# Patient Record
Sex: Female | Born: 1965 | Race: White | Hispanic: No | Marital: Married | Smoking: Never smoker
Health system: Southern US, Community
[De-identification: ages and names within clinical notes are randomized; demographics above are authoritative.]

## PROBLEM LIST (undated history)

## (undated) DIAGNOSIS — C8590 Non-Hodgkin lymphoma, unspecified, unspecified site: Secondary | ICD-10-CM

## (undated) DIAGNOSIS — F32A Depression, unspecified: Secondary | ICD-10-CM

## (undated) DIAGNOSIS — C801 Malignant (primary) neoplasm, unspecified: Secondary | ICD-10-CM

## (undated) HISTORY — DX: Non-Hodgkin lymphoma, unspecified, unspecified site: C85.90

## (undated) HISTORY — DX: Depression, unspecified: F32.A

## (undated) HISTORY — PX: CHOLECYSTECTOMY: SHX55

## (undated) HISTORY — PX: ABDOMINAL HYSTERECTOMY: SHX81

---

## 2007-03-23 ENCOUNTER — Emergency Department (HOSPITAL_COMMUNITY): Admission: EM | Admit: 2007-03-23 | Discharge: 2007-03-24 | Payer: Self-pay | Admitting: Emergency Medicine

## 2007-07-18 ENCOUNTER — Emergency Department (HOSPITAL_COMMUNITY): Admission: EM | Admit: 2007-07-18 | Discharge: 2007-07-18 | Payer: Self-pay | Admitting: Pediatrics

## 2007-08-13 ENCOUNTER — Emergency Department (HOSPITAL_COMMUNITY): Admission: EM | Admit: 2007-08-13 | Discharge: 2007-08-13 | Payer: Self-pay | Admitting: Emergency Medicine

## 2008-11-29 ENCOUNTER — Emergency Department: Payer: Self-pay | Admitting: Emergency Medicine

## 2011-06-23 LAB — INFLUENZA A+B VIRUS AG-DIRECT(RAPID)
Inflenza A Ag: NEGATIVE
Influenza B Ag: NEGATIVE

## 2011-06-30 LAB — URINE MICROSCOPIC-ADD ON

## 2011-06-30 LAB — URINALYSIS, ROUTINE W REFLEX MICROSCOPIC
Bilirubin Urine: NEGATIVE
Nitrite: POSITIVE — AB
Specific Gravity, Urine: 1.011
Urobilinogen, UA: 0.2
pH: 6

## 2017-11-01 DIAGNOSIS — Z8541 Personal history of malignant neoplasm of cervix uteri: Secondary | ICD-10-CM | POA: Insufficient documentation

## 2017-11-04 DIAGNOSIS — F331 Major depressive disorder, recurrent, moderate: Secondary | ICD-10-CM | POA: Insufficient documentation

## 2018-01-24 DIAGNOSIS — M2011 Hallux valgus (acquired), right foot: Secondary | ICD-10-CM | POA: Insufficient documentation

## 2018-01-24 DIAGNOSIS — M7741 Metatarsalgia, right foot: Secondary | ICD-10-CM | POA: Insufficient documentation

## 2018-01-24 DIAGNOSIS — M2021 Hallux rigidus, right foot: Secondary | ICD-10-CM | POA: Insufficient documentation

## 2018-03-21 DIAGNOSIS — M25562 Pain in left knee: Secondary | ICD-10-CM | POA: Insufficient documentation

## 2018-03-21 DIAGNOSIS — G8929 Other chronic pain: Secondary | ICD-10-CM | POA: Insufficient documentation

## 2018-03-21 DIAGNOSIS — M8949 Other hypertrophic osteoarthropathy, multiple sites: Secondary | ICD-10-CM | POA: Insufficient documentation

## 2018-03-21 DIAGNOSIS — M150 Primary generalized (osteo)arthritis: Secondary | ICD-10-CM | POA: Insufficient documentation

## 2018-03-21 DIAGNOSIS — M159 Polyosteoarthritis, unspecified: Secondary | ICD-10-CM | POA: Insufficient documentation

## 2018-06-06 DIAGNOSIS — M79671 Pain in right foot: Secondary | ICD-10-CM | POA: Insufficient documentation

## 2018-06-06 DIAGNOSIS — M9689 Other intraoperative and postprocedural complications and disorders of the musculoskeletal system: Secondary | ICD-10-CM | POA: Insufficient documentation

## 2018-06-06 DIAGNOSIS — M2041 Other hammer toe(s) (acquired), right foot: Secondary | ICD-10-CM | POA: Insufficient documentation

## 2019-01-04 DIAGNOSIS — T84498A Other mechanical complication of other internal orthopedic devices, implants and grafts, initial encounter: Secondary | ICD-10-CM | POA: Insufficient documentation

## 2019-02-06 ENCOUNTER — Other Ambulatory Visit: Payer: Self-pay

## 2019-02-06 ENCOUNTER — Emergency Department (HOSPITAL_COMMUNITY)
Admission: EM | Admit: 2019-02-06 | Discharge: 2019-02-06 | Disposition: A | Discharge status: Home / Self Care | Payer: BLUE CROSS/BLUE SHIELD | Attending: Emergency Medicine | Admitting: Emergency Medicine

## 2019-02-06 ENCOUNTER — Encounter (HOSPITAL_COMMUNITY): Payer: Self-pay | Admitting: Emergency Medicine

## 2019-02-06 DIAGNOSIS — K0889 Other specified disorders of teeth and supporting structures: Secondary | ICD-10-CM

## 2019-02-06 DIAGNOSIS — K029 Dental caries, unspecified: Secondary | ICD-10-CM | POA: Diagnosis not present

## 2019-02-06 MED ORDER — PENICILLIN V POTASSIUM 500 MG PO TABS: 500.0000 mg | ORAL_TABLET | Freq: Four times a day (QID) | ORAL | 0 refills | Status: AC

## 2019-02-06 MED ORDER — PENICILLIN V POTASSIUM 250 MG PO TABS
500.0000 mg | ORAL_TABLET | Freq: Once | ORAL | Status: AC
  Administered 2019-02-06: 500 mg via ORAL
  Filled 2019-02-06: qty 2

## 2019-02-06 MED ORDER — ACETAMINOPHEN 500 MG PO TABS: 500.0000 mg | ORAL_TABLET | Freq: Four times a day (QID) | ORAL | 0 refills | Status: DC | PRN

## 2019-02-06 MED ORDER — IBUPROFEN 600 MG PO TABS: 600.0000 mg | ORAL_TABLET | Freq: Four times a day (QID) | ORAL | 0 refills | Status: DC | PRN

## 2019-02-06 MED ORDER — ACETAMINOPHEN 500 MG PO TABS
1000.0000 mg | ORAL_TABLET | Freq: Once | ORAL | Status: AC
  Administered 2019-02-06: 18:00:00 1000 mg via ORAL
  Filled 2019-02-06: qty 2

## 2019-02-06 NOTE — ED Notes (Signed)
Patient verbalizes understanding of discharge instructions. Opportunity for questioning and answers were provided. Armband removed by staff, pt discharged from ED. Pt ambulatory to lobby. Prescriptions/pharmacy and follow up care reviewed. Pt ambulatory to lobby.

## 2019-02-06 NOTE — ED Provider Notes (Signed)
[facility]PART[facility] Provider Note   CSN: [id number] Arrival date & time: 02/06/19  1659    History   Chief Complaint Chief Complaint  Patient presents with  . Dental Pain    HPI Jennifer Cole is a 53 y.o. female who is previously healthy who presents with a 1 day history of right lower dental pain.  Patient has had problems with a tooth before, however began hurting again yesterday.  She has been taking 400 mg ibuprofen and Orajel without relief.  She denies any fever.  She has had some pain radiating to her ear.  She denies any neck pain.  She does not have a Pharmacist, community or insurance.     HPI  History reviewed. No pertinent past medical history.  There are no active problems to display for this patient.   History reviewed. No pertinent surgical history.   OB History   No obstetric history on file.      Home Medications    Prior to Admission medications   Medication Sig Start Date End Date Taking? Authorizing Provider  acetaminophen (TYLENOL) 500 MG tablet Take 1 tablet (500 mg total) by mouth every 6 (six) hours as needed. 02/06/19   Renessa Wellnitz, Bea Graff, PA-C  ibuprofen (ADVIL) 600 MG tablet Take 1 tablet (600 mg total) by mouth every 6 (six) hours as needed. 02/06/19   Frederica Kuster, PA-C  penicillin v potassium (VEETID) 500 MG tablet Take 1 tablet (500 mg total) by mouth 4 (four) times daily for 7 days. 02/06/19 02/13/19  Frederica Kuster, PA-C    Family History No family history on file.  Social History Social History   Tobacco Use  . Smoking status: Not on file  Substance Use Topics  . Alcohol use: Not on file  . Drug use: Not on file     Allergies   Patient has no known allergies.   Review of Systems Review of Systems  Constitutional: Negative for fever.  HENT: Positive for dental problem.   Musculoskeletal: Negative for neck pain.     Physical Exam Updated Vital Signs BP 108/74   Pulse 66   Temp 98.5 F (36.9  C) (Oral)   Resp 16   SpO2 96%   Physical Exam Vitals signs and nursing note reviewed.  Constitutional:      General: She is not in acute distress.    Appearance: She is well-developed. She is not diaphoretic.  HENT:     Head: Normocephalic and atraumatic.     Mouth/Throat:     Dentition: Abnormal dentition. Dental caries present.     Pharynx: No oropharyngeal exudate.      Comments: No trismus, no submandibular tenderness Eyes:     General: No scleral icterus.       Right eye: No discharge.        Left eye: No discharge.     Conjunctiva/sclera: Conjunctivae normal.     Pupils: Pupils are equal, round, and reactive to light.  Neck:     Musculoskeletal: Normal range of motion and neck supple.     Thyroid: No thyromegaly.  Cardiovascular:     Rate and Rhythm: Normal rate and regular rhythm.     Heart sounds: Normal heart sounds. No murmur. No friction rub. No gallop.   Pulmonary:     Effort: Pulmonary effort is normal. No respiratory distress.     Breath sounds: Normal breath sounds. No stridor. No wheezing or rales.  Lymphadenopathy:  Cervical: No cervical adenopathy.  Skin:    General: Skin is warm and dry.     Coloration: Skin is not pale.     Findings: No rash.  Neurological:     Mental Status: She is alert.     Coordination: Coordination normal.      ED Treatments / Results  Labs (all labs ordered are listed, but only abnormal results are displayed) Labs Reviewed - No data to display  EKG None  Radiology No results found.  Procedures Procedures (including critical care time)  Medications Ordered in ED Medications  penicillin v potassium (VEETID) tablet 500 mg (500 mg Oral Given 02/06/19 1746)  acetaminophen (TYLENOL) tablet 1,000 mg (1,000 mg Oral Given 02/06/19 1747)     Initial Impression / Assessment and Plan / ED Course  I have reviewed the triage vital signs and the nursing notes.  Pertinent labs & imaging results that were available  during my care of the patient were reviewed by me and considered in my medical decision making (see chart for details).        Patient with dentalgia.  No abscess requiring immediate incision and drainage.  Exam not concerning for Ludwig's angina or pharyngeal abscess.  Will treat with penicillin, ibuprofen, Tylenol. Pt instructed to follow-up with dentist.  Given several resources discussed return precautions.  Patient understands and agrees with plan.  Patient vitals stable throughout ED course and discharged in satisfactory condition.  Final Clinical Impressions(s) / ED Diagnoses   Final diagnoses:  Pain, dental    ED Discharge Orders         Ordered    penicillin v potassium (VEETID) 500 MG tablet  4 times daily     02/06/19 1809    ibuprofen (ADVIL) 600 MG tablet  Every 6 hours PRN     02/06/19 1809    acetaminophen (TYLENOL) 500 MG tablet  Every 6 hours PRN     02/06/19 1809           Frederica Kuster, PA-C 02/06/19 1847    Hayden Rasmussen, MD 02/07/19 1153

## 2019-02-06 NOTE — ED Triage Notes (Signed)
Pt having dental pain since last night on lower right tooth. Taking OTC with no pain relief. Pt needs dentist referrals.

## 2019-02-06 NOTE — Discharge Instructions (Addendum)
Take penicillin as prescribed until completed.  Take ibuprofen 600 mg every 6 hours.  Alternate with Tylenol 500 mg every 6 hours.  Please follow-up with Dr. Geralynn Ochs by calling first thing in the morning.  If you are unable to schedule appointments with him, please begin calling some of the other resources below.  Please return to the emergency department develop any new or worsening symptoms including lockjaw, significant swelling to your neck, or persistent fever of 100.4.

## 2019-05-16 ENCOUNTER — Other Ambulatory Visit: Payer: Self-pay

## 2019-05-16 DIAGNOSIS — Z20822 Contact with and (suspected) exposure to covid-19: Secondary | ICD-10-CM

## 2019-05-17 LAB — NOVEL CORONAVIRUS, NAA: SARS-CoV-2, NAA: NOT DETECTED

## 2019-06-29 DIAGNOSIS — L84 Corns and callosities: Secondary | ICD-10-CM | POA: Insufficient documentation

## 2019-06-29 DIAGNOSIS — M7742 Metatarsalgia, left foot: Secondary | ICD-10-CM | POA: Insufficient documentation

## 2019-12-15 ENCOUNTER — Ambulatory Visit: Payer: BLUE CROSS/BLUE SHIELD | Attending: Internal Medicine

## 2019-12-21 ENCOUNTER — Ambulatory Visit: Payer: BLUE CROSS/BLUE SHIELD | Attending: Internal Medicine

## 2019-12-21 DIAGNOSIS — Z23 Encounter for immunization: Secondary | ICD-10-CM

## 2019-12-21 NOTE — Progress Notes (Signed)
   Covid-19 Vaccination Clinic  Name:  Jennifer Cole    MRN: [id number] DOB: June 22, 1966  12/21/2019  [name] was observed post Covid-19 immunization for 15 minutes without incident. She was provided with Vaccine Information Sheet and instruction to access the V-Safe system.   [name] was instructed to call 911 with any severe reactions post vaccine: Marland Kitchen Difficulty breathing  . Swelling of face and throat  . A fast heartbeat  . A bad rash all over body  . Dizziness and weakness   Immunizations Administered    Name Date Dose VIS Date Route   Pfizer COVID-19 Vaccine 12/21/2019  4:29 PM 0.3 mL 08/25/2019 Intramuscular   Manufacturer: [place]   Lot: [id number]   [place]: [id number]

## 2020-01-15 ENCOUNTER — Ambulatory Visit: Payer: BLUE CROSS/BLUE SHIELD | Attending: Internal Medicine

## 2020-01-15 DIAGNOSIS — Z23 Encounter for immunization: Secondary | ICD-10-CM

## 2020-01-15 NOTE — Progress Notes (Signed)
   Covid-19 Vaccination Clinic  Name:  Jennifer Cole    MRN: [id number] DOB: 1966/03/18  01/15/2020  Jennifer Cole was observed post Covid-19 immunization for 15 minutes without incident. She was provided with Vaccine Information Sheet and instruction to access the V-Safe system.   Jennifer Cole was instructed to call 911 with any severe reactions post vaccine: Marland Kitchen Difficulty breathing  . Swelling of face and throat  . A fast heartbeat  . A bad rash all over body  . Dizziness and weakness   Immunizations Administered    Name Date Dose VIS Date Route   Pfizer COVID-19 Vaccine 01/15/2020  4:38 PM 0.3 mL 11/08/2018 Intramuscular   Manufacturer: [place]   Lot: [id number]   [place]: [id number]

## 2020-10-23 ENCOUNTER — Emergency Department: Payer: 59

## 2020-10-23 ENCOUNTER — Other Ambulatory Visit: Payer: Self-pay

## 2020-10-23 ENCOUNTER — Emergency Department
Admission: EM | Admit: 2020-10-23 | Discharge: 2020-10-23 | Disposition: A | Discharge status: Home / Self Care | Payer: 59 | Attending: Emergency Medicine | Admitting: Emergency Medicine

## 2020-10-23 DIAGNOSIS — Z791 Long term (current) use of non-steroidal anti-inflammatories (NSAID): Secondary | ICD-10-CM | POA: Insufficient documentation

## 2020-10-23 DIAGNOSIS — Z20822 Contact with and (suspected) exposure to covid-19: Secondary | ICD-10-CM | POA: Insufficient documentation

## 2020-10-23 DIAGNOSIS — R0602 Shortness of breath: Secondary | ICD-10-CM | POA: Diagnosis present

## 2020-10-23 DIAGNOSIS — J9811 Atelectasis: Secondary | ICD-10-CM | POA: Insufficient documentation

## 2020-10-23 DIAGNOSIS — J948 Other specified pleural conditions: Secondary | ICD-10-CM | POA: Diagnosis not present

## 2020-10-23 DIAGNOSIS — R918 Other nonspecific abnormal finding of lung field: Secondary | ICD-10-CM | POA: Insufficient documentation

## 2020-10-23 DIAGNOSIS — Z8541 Personal history of malignant neoplasm of cervix uteri: Secondary | ICD-10-CM | POA: Insufficient documentation

## 2020-10-23 DIAGNOSIS — D1430 Benign neoplasm of unspecified bronchus and lung: Secondary | ICD-10-CM | POA: Insufficient documentation

## 2020-10-23 DIAGNOSIS — Z79899 Other long term (current) drug therapy: Secondary | ICD-10-CM | POA: Diagnosis not present

## 2020-10-23 DIAGNOSIS — J90 Pleural effusion, not elsewhere classified: Secondary | ICD-10-CM | POA: Insufficient documentation

## 2020-10-23 DIAGNOSIS — Z9889 Other specified postprocedural states: Secondary | ICD-10-CM

## 2020-10-23 HISTORY — DX: Malignant (primary) neoplasm, unspecified: C80.1

## 2020-10-23 LAB — CBC WITH DIFFERENTIAL/PLATELET
Abs Immature Granulocytes: 0.02 10*3/uL (ref 0.00–0.07)
Basophils Absolute: 0.1 10*3/uL (ref 0.0–0.1)
Basophils Relative: 1 %
Eosinophils Absolute: 0.4 10*3/uL (ref 0.0–0.5)
Eosinophils Relative: 6 %
HCT: 38.4 % (ref 36.0–46.0)
Hemoglobin: 12.3 g/dL (ref 12.0–15.0)
Immature Granulocytes: 0 %
Lymphocytes Relative: 8 %
Lymphs Abs: 0.5 10*3/uL — ABNORMAL LOW (ref 0.7–4.0)
MCH: 29.3 pg (ref 26.0–34.0)
MCHC: 32 g/dL (ref 30.0–36.0)
MCV: 91.4 fL (ref 80.0–100.0)
Monocytes Absolute: 0.6 10*3/uL (ref 0.1–1.0)
Monocytes Relative: 9 %
Neutro Abs: 4.5 10*3/uL (ref 1.7–7.7)
Neutrophils Relative %: 76 %
Platelets: 322 10*3/uL (ref 150–400)
RBC: 4.2 MIL/uL (ref 3.87–5.11)
RDW: 13.9 % (ref 11.5–15.5)
WBC: 6 10*3/uL (ref 4.0–10.5)
nRBC: 0 % (ref 0.0–0.2)

## 2020-10-23 LAB — POC SARS CORONAVIRUS 2 AG -  ED: SARS Coronavirus 2 Ag: NEGATIVE

## 2020-10-23 LAB — PROCALCITONIN: Procalcitonin: <0.1 ng/mL

## 2020-10-23 LAB — BASIC METABOLIC PANEL
Anion gap: 13 (ref 5–15)
BUN: 17 mg/dL (ref 6–20)
CO2: 26 mmol/L (ref 22–32)
Calcium: 9.5 mg/dL (ref 8.9–10.3)
Chloride: 102 mmol/L (ref 98–111)
Creatinine, Ser: 0.65 mg/dL (ref 0.44–1.00)
GFR, Estimated: >60 mL/min (ref >60)
Glucose, Bld: 130 mg/dL — ABNORMAL HIGH (ref 70–99)
Potassium: 3.9 mmol/L (ref 3.5–5.1)
Sodium: 141 mmol/L (ref 135–145)

## 2020-10-23 LAB — TROPONIN I (HIGH SENSITIVITY)
Troponin I (High Sensitivity): 14 ng/L (ref <18)
Troponin I (High Sensitivity): 12 ng/L (ref <18)

## 2020-10-23 LAB — SARS CORONAVIRUS 2 (TAT 6-24 HRS): SARS Coronavirus 2: NEGATIVE

## 2020-10-23 LAB — BRAIN NATRIURETIC PEPTIDE: B Natriuretic Peptide: 17.3 pg/mL (ref 0.0–100.0)

## 2020-10-23 MED ORDER — IOHEXOL 350 MG/ML SOLN
75.0000 mL | Freq: Once | INTRAVENOUS | Status: AC | PRN
  Administered 2020-10-23: 75 mL via INTRAVENOUS

## 2020-10-23 NOTE — ED Provider Notes (Signed)
[facility] Emergency Department Provider Note   ____________________________________________   Event Date/Time   First MD Initiated Contact with Patient 10/23/20 [phone]     (approximate)  I have reviewed the triage vital signs and the nursing notes.   HISTORY  Chief Complaint Shortness of Breath    HPI Modell Corona is a 55 y.o. female with no significant past medical history who presents to the ED complaining of shortness of breath.  Patient reports that she started feeling short of breath 3 days ago with a nonproductive cough.  Symptoms have gradually worsened since then and seem to be exacerbated when she is up walking around.  She felt increasingly short of breath at work today and decided to be evaluated in the ED.  She denies any fevers, nausea, vomiting, diarrhea, or abdominal pain.  She does describe a feeling of tightness in her chest, but denies any overt chest pain.  She has not noticed any pain or swelling in her legs and denies any recent surgeries or long trips.  She was exposed to her and recently, who has tested positive for COVID-19.  She is fully vaccinated against COVID-19.        Past Medical History:  Diagnosis Date  . Cancer ([place])    cervical    There are no problems to display for this patient.   No past surgical history on file.  Prior to Admission medications   Medication Sig Start Date End Date Taking? Authorizing Provider  acetaminophen (TYLENOL) 500 MG tablet Take 1 tablet (500 mg total) by mouth every 6 (six) hours as needed. 02/06/19   Law, Bea Graff, PA-C  ibuprofen (ADVIL) 600 MG tablet Take 1 tablet (600 mg total) by mouth every 6 (six) hours as needed. 02/06/19   Frederica Kuster, PA-C    Allergies Patient has no known allergies.  No family history on file.  Social History    Review of Systems  Constitutional: No fever/chills Eyes: No visual changes. ENT: No sore throat. Cardiovascular: Positive for  chest tightness. Respiratory: Positive for cough and shortness of breath. Gastrointestinal: No abdominal pain.  No nausea, no vomiting.  No diarrhea.  No constipation. Genitourinary: Negative for dysuria. Musculoskeletal: Negative for back pain. Skin: Negative for rash. Neurological: Negative for headaches, focal weakness or numbness.  ____________________________________________   PHYSICAL EXAM:  VITAL SIGNS: ED Triage Vitals [10/23/20 0749]  Enc Vitals Group     BP 126/78     Pulse Rate 94     Resp (!) 24     Temp (!) 97.5 F (36.4 C)     Temp Source Oral     SpO2 96 %     Weight 214 lb (97.1 kg)     Height 5\' 6"  (1.676 m)     Head Circumference      Peak Flow      Pain Score 0     Pain Loc      Pain Edu?      Excl. in [place]?     Constitutional: Alert and oriented. Eyes: Conjunctivae are normal. Head: Atraumatic. Nose: No congestion/rhinnorhea. Mouth/Throat: Mucous membranes are moist. Neck: Normal ROM Cardiovascular: Normal rate, regular rhythm. Grossly normal heart sounds.  2+ radial pulses bilaterally. Respiratory: Normal respiratory effort.  No retractions. Lungs CTAB. Gastrointestinal: Soft and nontender. No distention. Genitourinary: deferred Musculoskeletal: No lower extremity tenderness nor edema. Neurologic:  Normal speech and language. No gross focal neurologic deficits are appreciated. Skin:  Skin is  warm, dry and intact. No rash noted. Psychiatric: Mood and affect are normal. Speech and behavior are normal.  ____________________________________________   LABS (all labs ordered are listed, but only abnormal results are displayed)  Labs Reviewed  CBC WITH DIFFERENTIAL/PLATELET - Abnormal; Notable for the following components:      Result Value   Lymphs Abs 0.5 (*)    All other components within normal limits  BASIC METABOLIC PANEL - Abnormal; Notable for the following components:   Glucose, Bld 130 (*)    All other components within normal limits   SARS CORONAVIRUS 2 (TAT 6-24 HRS)  BRAIN NATRIURETIC PEPTIDE  PROCALCITONIN  POC SARS CORONAVIRUS 2 AG -  ED  CYTOLOGY - NON PAP  TROPONIN I (HIGH SENSITIVITY)  TROPONIN I (HIGH SENSITIVITY)   ____________________________________________  EKG  ED ECG REPORT I, Blake Divine, the attending physician, personally viewed and interpreted this ECG.   Date: 10/23/2020  EKG Time: 7:54  Rate: 89  Rhythm: normal sinus rhythm  Axis: Normal  Intervals:none  ST&T Change: None   PROCEDURES  Procedure(s) performed (including Critical Care):  Procedures   ____________________________________________   INITIAL IMPRESSION / ASSESSMENT AND PLAN / ED COURSE       55 year old female with no significant past medical history presents the ED complaining of cough and shortness of breath associated with chest tightness worsening over the past 3 days.  She is not in any respiratory distress on my evaluation and is maintaining O2 sats on room air.  EKG shows no evidence of arrhythmia or ischemia.  We will check labs including troponin but I have a low suspicion for ACS.  She is low risk by [place] for PE, chest x-ray pending at this time.  Testing for COVID-19 is also pending.  If work-up is unremarkable, I would consider further work-up for PE with D-dimer.  Chest x-ray reviewed by me and shows moderately sized right pleural effusion.  No leukocytosis and procalcitonin is negative, this seems unlikely to be a parapneumonic effusion and patient does not have signs of fluid overload to suggest CHF.  Findings were further assessed with CTA of chest, which is negative for PE but shows large right-sided mediastinal mass, concerning for malignancy.  Case discussed with Dr. Grayland Ormond of oncology and patient was offered admission, but she declines.  Dr. Grayland Ormond states this is reasonable and he will be able to see the patient in close follow-up, potentially tomorrow.  Case discussed with Dr. Annamaria Boots of  interventional radiology, who will perform thoracentesis before patient is discharged home in order to assist with her work of breathing.  Per Dr. 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  Patient turned over to oncoming provider pending thoracentesis.  If she continues to feel well following procedure, she would be appropriate for discharge home with close oncology follow-up.      ____________________________________________   FINAL CLINICAL IMPRESSION(S) / ED DIAGNOSES  Final diagnoses:  Pleural effusion  Mass of right lung     ED Discharge Orders    None       Note:  This document was prepared using Dragon voice recognition software and may include unintentional dictation errors.   Blake Divine, MD 10/23/20 1452

## 2020-10-23 NOTE — Consult Note (Signed)
[facility]  [phone]) [phone] [phone]) [phone]  ID: [name] OB: 1966/05/01  MR#: [id number]  VFI#:[id number]  Patient Care Team: Patient, [name] as PCP - General (General Practice)  CHIEF COMPLAINT: Large infiltrative mediastinal right lung mass.  INTERVAL HISTORY: Patient is a 55 year old female who presented to the emergency room with 2 to 3-day history of worsening shortness of breath and fatigue.  She states prior to her symptoms she was active and in no normal health.  She continues to work full-time.  She otherwise feels well.  She has no neurologic complaints.  She denies any recent fevers or illnesses.  She has a good appetite and denies weight loss.  She denies any chest pain or hemoptysis.  She has no nausea, vomiting, constipation, or diarrhea.  She has no urinary complaints.  Patient otherwise feels well and offers no further specific complaints today.  REVIEW OF SYSTEMS:   Review of Systems  Constitutional: Negative.  Negative for fever, malaise/fatigue and weight loss.  Respiratory: Positive for cough and shortness of breath. Negative for hemoptysis.   Cardiovascular: Negative.  Negative for chest pain and leg swelling.  Gastrointestinal: Negative for abdominal pain.  Genitourinary: Negative.  Negative for dysuria.  Musculoskeletal: Negative.  Negative for back pain.  Skin: Negative.  Negative for rash.  Neurological: Negative.  Negative for dizziness, focal weakness, weakness and headaches.    As per HPI. Otherwise, a complete review of systems is negative.  PAST MEDICAL HISTORY: Past Medical History:  Diagnosis Date  . Cancer ([place])    cervical    PAST SURGICAL HISTORY: No past surgical history on file.  FAMILY HISTORY: No family history on file.  ADVANCED DIRECTIVES (Y/N):  @ADVDIR @  HEALTH MAINTENANCE:     Colonoscopy:  PAP:  Bone density:  Lipid panel:  No Known Allergies  No current facility-administered  medications for this encounter.   Current Outpatient Medications  Medication Sig Dispense Refill  . acetaminophen (TYLENOL) 500 MG tablet Take 1 tablet (500 mg total) by mouth every 6 (six) hours as needed. 30 tablet 0  . ibuprofen (ADVIL) 600 MG tablet Take 1 tablet (600 mg total) by mouth every 6 (six) hours as needed. 30 tablet 0    OBJECTIVE: Vitals:   10/23/20 1527 10/23/20 1635  BP: 121/63 132/86  Pulse: 99 89  Resp:  18  Temp:    SpO2: 96% 97%     Body mass index is 34.54 kg/m.    ECOG FS:1 - Symptomatic but completely ambulatory  General: Well-developed, well-nourished, no acute distress. Eyes: Pink conjunctiva, anicteric sclera. HEENT: Normocephalic, moist mucous membranes. Lungs: Diminished right breath sounds.   Heart: Regular rate and rhythm. Abdomen: Soft, nontender, no obvious distention. Musculoskeletal: No edema, cyanosis, or clubbing. Neuro: Alert, answering all questions appropriately. Cranial nerves grossly intact. Skin: No rashes or petechiae noted. Psych: Normal affect. Lymphatics: No cervical, calvicular, axillary or inguinal LAD.   LAB RESULTS:  Lab Results  Component Value Date   NA 141 10/23/2020   K 3.9 10/23/2020   CL 102 10/23/2020   CO2 26 10/23/2020   GLUCOSE 130 (H) 10/23/2020   BUN 17 10/23/2020   CREATININE 0.65 10/23/2020   CALCIUM 9.5 10/23/2020   GFRNONAA >60 10/23/2020    Lab Results  Component Value Date   WBC 6.0 10/23/2020   NEUTROABS 4.5 10/23/2020   HGB 12.3 10/23/2020   HCT 38.4 10/23/2020   MCV 91.4 10/23/2020   PLT 322 10/23/2020  STUDIES: DG Chest 2 View  Result Date: 10/23/2020 CLINICAL DATA:  55 year old female with increasing shortness of breath for 2 days. EXAM: CHEST - 2 VIEW COMPARISON:  None. FINDINGS: Moderate size layering right pleural effusion with dense right lung base opacification. No superimposed pulmonary edema or pneumothorax. The left lung appears to remain clear. Possible cardiomegaly.  Right hilum and right heart border are obscured. Furthermore, possible tapered appearance of the right bronchus intermedius. Visualized tracheal air column is within normal limits. No acute osseous abnormality identified. Negative visible bowel gas pattern. Surgical clips in the upper abdomen. IMPRESSION: 1. Moderate size right pleural effusion. Underlying mass or pneumonia not excluded. 2. Questionable cardiomegaly.  No overt edema. Electronically Signed   By: Genevie Ann M.D.   On: 10/23/2020 08:51   CT Angio Chest PE W/Cm &/Or Wo Cm  Result Date: 10/23/2020 CLINICAL DATA:  55 year old female with increasing shortness of breath and abnormal chest x-ray this morning. EXAM: CT ANGIOGRAPHY CHEST WITH CONTRAST TECHNIQUE: Multidetector CT imaging of the chest was performed using the standard protocol during bolus administration of intravenous contrast. Multiplanar CT image reconstructions and MIPs were obtained to evaluate the vascular anatomy. CONTRAST:  56mL OMNIPAQUE IOHEXOL 350 MG/ML SOLN COMPARISON:  Chest radiographs 10/23/2020. FINDINGS: Cardiovascular: Adequate contrast bolus timing in the pulmonary arterial tree. Respiratory motion degrades detail of the distal left lower lobe pulmonary arteries. Elsewhere no focal filling defect identified in the pulmonary arteries to suggest acute pulmonary embolism. However, there is a large mediastinal mass which severely narrows the right middle and upper lobe pulmonary artery branches (series 6, image 121). The mass infiltrates the superior mediastinum and is also inseparable from the ascending aorta, some of the proximal great vessels, cardiac base and right heart border. No pericardial effusion. No cardiomegaly. Aorta and proximal great vessels remain patent without irregularity. The mass also severely attenuates the right pulmonary veins. Mediastinum/Nodes: Large, infiltrative mediastinal mass occupying the superior and anterior mediastinum tracking inferiorly on the  right. The mass encompasses 9.9 x 14.1 x 14.2 cm (AP by transverse by CC). See series 5, image 51 and coronal series 10, image 30. Slightly Discontinuous ex-nodal disease is noted about the disease great vessels. There is no lymphadenopathy in the visible lower neck or in the axilla. Associated regional mass effect, including on the right airways, see below. Lungs/Pleura: Moderate to large mixed subpleural and layering right pleural effusion with simple fluid density. Compressive atelectasis of virtually the entire right lower lobe. Atelectasis of the right middle lobe is related both to pleural fluid and the large mediastinal mass. There is irregular narrowing of the bronchus intermedius (series 7, image 42) and the other right hilar airways are also encased by tumor. Major airways do remain patent. In the left lung 2 indeterminate although somewhat sub solid appearing pulmonary nodules are identified in the peripheral upper lobe (series 7, image 28, 4-5 mm) and lower lobe just above the diaphragm (6-7 mm image 71). Trace left pleural fluid. Upper Abdomen: Absent gallbladder. Visible liver, spleen, pancreas, adrenal glands, kidneys and bowel remain within normal limits. Musculoskeletal: No sternal or rib destruction related to the mass. No acute or suspicious osseous lesion is identified. Review of the MIP images confirms the above findings. IMPRESSION: 1. 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 Discontinuous ex-nodal disease in the superior prevascular space. Top differential considerations include small cell carcinoma, lymphoma,  and non-small cell bronchogenic carcinoma. 2. Superimposed moderate to large layering and sub-pulmonic right pleural effusion associated right lung atelectasis. 3. Two small indeterminate left lung pulmonary nodules. 4. Negative for acute pulmonary  embolus. No metastatic disease identified in the upper abdomen. Electronically Signed   By: Genevie Ann M.D.   On: 10/23/2020 11:30   DG Chest Port 1 View  Result Date: 10/23/2020 CLINICAL DATA:  Post thoracentesis EXAM: PORTABLE CHEST 1 VIEW COMPARISON:  CT 10/23/2020 FINDINGS: Interval decrease in the size of the right pleural effusion with some small to moderate volume residual layering fluid in the right lung base. Suspect at least trace left effusion. Residual atelectatic changes are noted in both lungs. Redemonstration of the irregular heterogeneous mediastinal contours compatible with the infiltrative mediastinal mass seen on comparison CT imaging. Previously seen pulmonary nodules are less well visualized radiographically. No visible pneumothorax. No acute osseous or soft tissue abnormality. Telemetry leads overlie the chest. IMPRESSION: 1. Interval decrease in the size of the right pleural effusion with some small to moderate volume residual layering fluid in the right lung base. No pneumothorax. Suspect at least trace left effusion. 2. Irregular mediastinal margins compatible with the infiltrative mass seen on comparison CT. Electronically Signed   By: Lovena Le M.D.   On: 10/23/2020 16:11   US THORACENTESIS ASP PLEURAL SPACE W/IMG GUIDE  Result Date: 10/23/2020 INDICATION: RIGHT PLEURAL EFFUSION EXAM: ULTRASOUND GUIDED RIGHT THORACENTESIS MEDICATIONS: 1% LIDOCAINE LOCAL COMPLICATIONS: None immediate. PROCEDURE: An ultrasound guided thoracentesis was thoroughly discussed with the patient and questions answered. The benefits, risks, alternatives and complications were also discussed. The patient understands and wishes to proceed with the procedure. Written consent was obtained. Ultrasound was performed to localize and mark an adequate pocket of fluid in the right chest. The area was then prepped and draped in the normal sterile fashion. 1% Lidocaine was used for local anesthesia. Under ultrasound  guidance a 6 Fr Safe-T-Centesis catheter was introduced. Thoracentesis was performed. The catheter was removed and a dressing applied. FINDINGS: A total of approximately 1.1 L of CLEAR PLEURAL fluid was removed. Samples were sent to the laboratory as requested by the clinical team. IMPRESSION: Successful ultrasound guided right thoracentesis yielding 1.1 L of pleural fluid. Electronically Signed   By: Jerilynn Mages.  Shick M.D.   On: 10/23/2020 15:59    ASSESSMENT:  Large infiltrative mediastinal right lung mass.  PLAN:    1.  Large infiltrative mediastinal right lung mass: CT scan results reviewed independently.  Highly suspicious for underlying malignancy.  Pleural fluid is also likely malignant and patient underwent thoracentesis removing approximately 1.1 L of fluid.  Cytology is pending at time of dictation.  Patient was stable and did not require admission to the hospital.  As an outpatient will order PET scan as well as MRI brain to complete the staging work-up.  If cytology is not sufficient for diagnosis or molecular studies, patient may require a biopsy in the future as well to obtain more tissue.  No further intervention is needed.  Patient has an appointment in the cancer center on Monday, October 28, 2020.  Appreciate consult. Patient expressed understanding and was in agreement with this plan. She also understands that She can call clinic at any time with any questions, concerns, or complaints.   Lloyd Huger, MD   10/23/2020 7:56 PM

## 2020-10-23 NOTE — Procedures (Signed)
Interventional Radiology Procedure Note  Procedure: [place] RT THORACENTESIS    Complications: None  Estimated Blood Loss:  MIN  Findings: 1.1 L REMOVED    Tamera Punt, MD

## 2020-10-23 NOTE — ED Notes (Signed)
Consulting Provider at bedside.

## 2020-10-23 NOTE — ED Notes (Signed)
Patient transported to X-ray 

## 2020-10-23 NOTE — ED Notes (Signed)
Patient to go for thoracentesis then be discharged home from ED

## 2020-10-23 NOTE — ED Triage Notes (Signed)
Pt to ED POV for chief complaint of SHOB x3 days worsening today while at work at [employer].  Pt tearful, appears uncomfortable, speaking in short sentences.  Denies hx of COPD, CHF, asthma  Denies cp

## 2020-10-24 ENCOUNTER — Encounter: Payer: Self-pay | Admitting: *Deleted

## 2020-10-24 DIAGNOSIS — J9859 Other diseases of mediastinum, not elsewhere classified: Secondary | ICD-10-CM

## 2020-10-24 NOTE — Progress Notes (Signed)
  Oncology Nurse Navigator Documentation  Navigator Location: [facility] (10/24/20 1100) Referral Date to RadOnc/MedOnc: 10/23/20 (10/24/20 1100) )Navigator Encounter Type: Appt/Treatment Plan Review (10/24/20 1100)   Abnormal Finding Date: 10/23/20 (10/24/20 1100)                     Barriers/Navigation Needs: Coordination of Care (10/24/20 1100)   Interventions: Coordination of Care (10/24/20 1100)   Coordination of Care: Appts;Radiology (10/24/20 1100)       Referral received yesterday for mediastinal mass. Per Dr. Grayland Ormond, pt will need outpatient PET scan. Will initiate authorization process and schedule pt once approved. Will follow up with pt at next clinic visit on Monday 2/14 to introduce to navigator services and review scheduled appts.

## 2020-10-25 DIAGNOSIS — R918 Other nonspecific abnormal finding of lung field: Secondary | ICD-10-CM | POA: Insufficient documentation

## 2020-10-25 LAB — CYTOLOGY - NON PAP

## 2020-10-25 NOTE — Progress Notes (Signed)
Jennifer Cole  [phone] [phone]  ID: Jennifer Cole OB: August 17, 1966  MR#: [id number]  ZTI#:[id number]  Patient Care Team: Patient, [name] as PCP - General ([place]) Telford Nab, RN as Oncology Nurse Navigator  CHIEF COMPLAINT: Right lung mass.  INTERVAL HISTORY: Patient is a 55 year old female who was initially evaluated in the emergency room after complaining of increasing shortness of breath and fatigue.  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  Patient underwent thoracentesis removing 1.1 L of fluid, but cytology was inconclusive.  She states she felt well for 1 to 2 days after her procedure, but then started having increasing shortness of breath and now has to sleep upright once again.  She also has a chronic cough.  Finally, she complains of increased headache particularly with bending over.  She has no other neurologic complaints.  She denies any recent fevers.  She has a fair appetite, but denies weight loss.  She has no chest pain or hemoptysis.  She denies any nausea, vomiting, constipation, or diarrhea.  She has no urinary complaints.  Patient offers no further specific complaints today.  REVIEW OF SYSTEMS:   Review of Systems  Constitutional: Positive for malaise/fatigue. Negative for fever and weight loss.  Respiratory: Positive for cough and shortness of breath. Negative for hemoptysis.   Cardiovascular: Negative.  Negative for chest pain and leg swelling.  Gastrointestinal: Negative.  Negative for abdominal pain.  Genitourinary: Negative.  Negative for dysuria.  Musculoskeletal: Negative.  Negative for back pain.  Skin: Negative.   Neurological: Positive for weakness and headaches. Negative for dizziness and focal weakness.  Psychiatric/Behavioral: Negative.  The patient is not nervous/anxious.     As per HPI. Otherwise, a complete review of systems is negative.  PAST  MEDICAL HISTORY: Past Medical History:  Diagnosis Date  . Cancer ([place])    cervical    PAST SURGICAL HISTORY: History reviewed. No pertinent surgical history.  FAMILY HISTORY: History reviewed. No pertinent family history.  ADVANCED DIRECTIVES (Y/N):  N  HEALTH MAINTENANCE: Social History   Tobacco Use  . Smoking status: Never Smoker  . Smokeless tobacco: Never Used  Substance Use Topics  . Alcohol use: Never     Colonoscopy:  PAP:  Bone density:  Lipid panel:  No Known Allergies  Current Outpatient Medications  Medication Sig Dispense Refill  . acetaminophen (TYLENOL) 500 MG tablet Take 1 tablet (500 mg total) by mouth every 6 (six) hours as needed. 30 tablet 0  . albuterol (VENTOLIN HFA) 108 (90 Base) MCG/ACT inhaler Inhale 2 puffs into the lungs every 6 (six) hours as needed for wheezing or shortness of breath. 8 g 2  . folic acid (FOLVITE) 1 MG tablet Take 1 mg by mouth daily.    Marland Kitchen ibuprofen (ADVIL) 600 MG tablet Take 1 tablet (600 mg total) by mouth every 6 (six) hours as needed. 30 tablet 0   No current facility-administered medications for this visit.    OBJECTIVE: Vitals:   10/28/20 0937  BP: 114/82  Pulse: 92  Resp: 20  Temp: 98.8 F (37.1 C)  SpO2: 98%     Body mass index is 34.1 kg/m.    ECOG FS:1 - Symptomatic but completely ambulatory  General: Well-developed, well-nourished, no acute distress. Eyes: Pink conjunctiva, anicteric sclera. HEENT: Normocephalic, moist mucous membranes. Lungs: Diminished right breath sounds. Heart: Regular rate and rhythm. Abdomen: Soft, nontender, no obvious distention. Musculoskeletal: No edema,  cyanosis, or clubbing. Neuro: Alert, answering all questions appropriately. Cranial nerves grossly intact. Skin: No rashes or petechiae noted. Psych: Normal affect. Lymphatics: No cervical, calvicular, axillary or inguinal LAD.   LAB RESULTS:  Lab Results  Component Value Date   NA 141 10/23/2020   K 3.9  10/23/2020   CL 102 10/23/2020   CO2 26 10/23/2020   GLUCOSE 130 (H) 10/23/2020   BUN 17 10/23/2020   CREATININE 0.65 10/23/2020   CALCIUM 9.5 10/23/2020   GFRNONAA >60 10/23/2020    Lab Results  Component Value Date   WBC 6.0 10/23/2020   NEUTROABS 4.5 10/23/2020   HGB 12.3 10/23/2020   HCT 38.4 10/23/2020   MCV 91.4 10/23/2020   PLT 322 10/23/2020     STUDIES: DG Chest 2 View  Result Date: 10/23/2020 CLINICAL DATA:  55 year old female with increasing shortness of breath for 2 days. EXAM: CHEST - 2 VIEW COMPARISON:  None. FINDINGS: Moderate size layering right pleural effusion with dense right lung base opacification. No superimposed pulmonary edema or pneumothorax. The left lung appears to remain clear. Possible cardiomegaly. Right hilum and right heart border are obscured. Furthermore, possible tapered appearance of the right bronchus intermedius. Visualized tracheal air column is within normal limits. No acute osseous abnormality identified. Negative visible bowel gas pattern. Surgical clips in the upper abdomen. IMPRESSION: 1. Moderate size right pleural effusion. Underlying mass or pneumonia not excluded. 2. Questionable cardiomegaly.  No overt edema. Electronically Signed   By: Genevie Ann M.D.   On: 10/23/2020 08:51   CT Angio Chest PE W/Cm &/Or Wo Cm  Result Date: 10/23/2020 CLINICAL DATA:  55 year old female with increasing shortness of breath and abnormal chest x-ray this morning. EXAM: CT ANGIOGRAPHY CHEST WITH CONTRAST TECHNIQUE: Multidetector CT imaging of the chest was performed using the standard protocol during bolus administration of intravenous contrast. Multiplanar CT image reconstructions and MIPs were obtained to evaluate the vascular anatomy. CONTRAST:  39mL OMNIPAQUE IOHEXOL 350 MG/ML SOLN COMPARISON:  Chest radiographs 10/23/2020. FINDINGS: Cardiovascular: Adequate contrast bolus timing in the pulmonary arterial tree. Respiratory motion degrades detail of the distal  left lower lobe pulmonary arteries. Elsewhere no focal filling defect identified in the pulmonary arteries to suggest acute pulmonary embolism. However, there is a large mediastinal mass which severely narrows the right middle and upper lobe pulmonary artery branches (series 6, image 121). The mass infiltrates the superior mediastinum and is also inseparable from the ascending aorta, some of the proximal great vessels, cardiac base and right heart border. No pericardial effusion. No cardiomegaly. Aorta and proximal great vessels remain patent without irregularity. The mass also severely attenuates the right pulmonary veins. Mediastinum/Nodes: Large, infiltrative mediastinal mass occupying the superior and anterior mediastinum tracking inferiorly on the right. The mass encompasses 9.9 x 14.1 x 14.2 cm (AP by transverse by CC). See series 5, image 51 and coronal series 10, image 30. Slightly Discontinuous ex-nodal disease is noted about the disease great vessels. There is no lymphadenopathy in the visible lower neck or in the axilla. Associated regional mass effect, including on the right airways, see below. Lungs/Pleura: Moderate to large mixed subpleural and layering right pleural effusion with simple fluid density. Compressive atelectasis of virtually the entire right lower lobe. Atelectasis of the right middle lobe is related both to pleural fluid and the large mediastinal mass. There is irregular narrowing of the bronchus intermedius (series 7, image 42) and the other right hilar airways are also encased by tumor. Major airways do remain  patent. In the left lung 2 indeterminate although somewhat sub solid appearing pulmonary nodules are identified in the peripheral upper lobe (series 7, image 28, 4-5 mm) and lower lobe just above the diaphragm (6-7 mm image 71). Trace left pleural fluid. Upper Abdomen: Absent gallbladder. Visible liver, spleen, pancreas, adrenal glands, kidneys and bowel remain within normal  limits. Musculoskeletal: No sternal or rib destruction related to the mass. No acute or suspicious osseous lesion is identified. Review of the MIP images confirms the above findings. IMPRESSION: 1. 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 Discontinuous ex-nodal disease in the superior prevascular space. Top differential considerations include small cell carcinoma, lymphoma, and non-small cell bronchogenic carcinoma. 2. Superimposed moderate to large layering and sub-pulmonic right pleural effusion associated right lung atelectasis. 3. Two small indeterminate left lung pulmonary nodules. 4. Negative for acute pulmonary embolus. No metastatic disease identified in the upper abdomen. Electronically Signed   By: Genevie Ann M.D.   On: 10/23/2020 11:30   DG Chest Port 1 View  Result Date: 10/23/2020 CLINICAL DATA:  Post thoracentesis EXAM: PORTABLE CHEST 1 VIEW COMPARISON:  CT 10/23/2020 FINDINGS: Interval decrease in the size of the right pleural effusion with some small to moderate volume residual layering fluid in the right lung base. Suspect at least trace left effusion. Residual atelectatic changes are noted in both lungs. Redemonstration of the irregular heterogeneous mediastinal contours compatible with the infiltrative mediastinal mass seen on comparison CT imaging. Previously seen pulmonary nodules are less well visualized radiographically. No visible pneumothorax. No acute osseous or soft tissue abnormality. Telemetry leads overlie the chest. IMPRESSION: 1. Interval decrease in the size of the right pleural effusion with some small to moderate volume residual layering fluid in the right lung base. No pneumothorax. Suspect at least trace left effusion. 2. Irregular mediastinal margins compatible with the infiltrative mass seen on comparison CT. Electronically Signed   By:  Lovena Le M.D.   On: 10/23/2020 16:11   US THORACENTESIS ASP PLEURAL SPACE W/IMG GUIDE  Result Date: 10/23/2020 INDICATION: RIGHT PLEURAL EFFUSION EXAM: ULTRASOUND GUIDED RIGHT THORACENTESIS MEDICATIONS: 1% LIDOCAINE LOCAL COMPLICATIONS: None immediate. PROCEDURE: An ultrasound guided thoracentesis was thoroughly discussed with the patient and questions answered. The benefits, risks, alternatives and complications were also discussed. The patient understands and wishes to proceed with the procedure. Written consent was obtained. Ultrasound was performed to localize and mark an adequate pocket of fluid in the right chest. The area was then prepped and draped in the normal sterile fashion. 1% Lidocaine was used for local anesthesia. Under ultrasound guidance a 6 Fr Safe-T-Centesis catheter was introduced. Thoracentesis was performed. The catheter was removed and a dressing applied. FINDINGS: A total of approximately 1.1 L of CLEAR PLEURAL fluid was removed. Samples were sent to the laboratory as requested by the clinical team. IMPRESSION: Successful ultrasound guided right thoracentesis yielding 1.1 L of pleural fluid. Electronically Signed   By: Jerilynn Mages.  Shick M.D.   On: 10/23/2020 15:59    ASSESSMENT: Right lung mass  PLAN:    1. Right lung mass: CT scan results from October 23, 2020 reviewed independently and report as above highly suspicious for underlying malignancy.  Patient underwent thoracentesis on the same day removing 1.1 L of fluid, unfortunately cytology was inconclusive.  Have referred patient to pulmonology for EBUS and biopsy for definitive diagnosis.  She will also require an MRI of  the brain and PET scan to complete the staging work-up.  Return to clinic in 2 to 3 weeks after her biopsy and imaging to discuss the results and treatment planning. 2.  Headache: Patient states it only bothers her when bending over.  MRI of the brain as above. 3.  Worsening shortness of breath: Patient possibly  has a reaccumulation of her pleural effusion.  Will get a chest x-ray today and consider repeat thoracentesis if needed.  Can also consider Pleurx catheter in the near future.  Patient was given a prescription for an albuterol inhaler today. 4.  Cough: Patient has been instructed to try OTC cough remedies.   Patient expressed understanding and was in agreement with this plan. She also understands that She can call clinic at any time with any questions, concerns, or complaints.   Cancer Staging No matching staging information was found for the patient.  Lloyd Huger, MD   10/28/2020 11:16 AM

## 2020-10-28 ENCOUNTER — Inpatient Hospital Stay: Payer: 59 | Attending: Oncology | Admitting: Oncology

## 2020-10-28 ENCOUNTER — Encounter: Payer: Self-pay | Admitting: *Deleted

## 2020-10-28 ENCOUNTER — Other Ambulatory Visit: Payer: Self-pay | Admitting: *Deleted

## 2020-10-28 ENCOUNTER — Ambulatory Visit
Admission: RE | Admit: 2020-10-28 | Discharge: 2020-10-28 | Disposition: A | Discharge status: Home / Self Care | Payer: 59 | Source: Ambulatory Visit | Attending: Oncology | Admitting: Oncology

## 2020-10-28 ENCOUNTER — Encounter: Payer: Self-pay | Admitting: Oncology

## 2020-10-28 ENCOUNTER — Other Ambulatory Visit: Payer: Self-pay

## 2020-10-28 DIAGNOSIS — R519 Headache, unspecified: Secondary | ICD-10-CM | POA: Diagnosis not present

## 2020-10-28 DIAGNOSIS — R0602 Shortness of breath: Secondary | ICD-10-CM | POA: Insufficient documentation

## 2020-10-28 DIAGNOSIS — Z20822 Contact with and (suspected) exposure to covid-19: Secondary | ICD-10-CM | POA: Insufficient documentation

## 2020-10-28 DIAGNOSIS — R059 Cough, unspecified: Secondary | ICD-10-CM | POA: Diagnosis not present

## 2020-10-28 DIAGNOSIS — R918 Other nonspecific abnormal finding of lung field: Secondary | ICD-10-CM | POA: Diagnosis not present

## 2020-10-28 DIAGNOSIS — J9859 Other diseases of mediastinum, not elsewhere classified: Secondary | ICD-10-CM

## 2020-10-28 DIAGNOSIS — Z01812 Encounter for preprocedural laboratory examination: Secondary | ICD-10-CM | POA: Diagnosis not present

## 2020-10-28 DIAGNOSIS — J90 Pleural effusion, not elsewhere classified: Secondary | ICD-10-CM | POA: Diagnosis not present

## 2020-10-28 MED ORDER — ALBUTEROL SULFATE HFA 108 (90 BASE) MCG/ACT IN AERS: 2.0000 | INHALATION_SPRAY | Freq: Four times a day (QID) | RESPIRATORY_TRACT | 2 refills | Status: DC | PRN

## 2020-10-28 NOTE — Progress Notes (Signed)
Patient here today for hospital follow up, seen in ER for lung mass. Patient reports improvement in shortness of breath after thoracentesis. She states she is noticing increased shortness of breath at this time, worse at night as she is having to sit up to sleep.

## 2020-10-28 NOTE — Progress Notes (Signed)
  Oncology Nurse Navigator Documentation  Navigator Location: [facility]-Med Onc (10/28/20 1200)   )Navigator Encounter Type: Initial MedOnc (10/28/20 1200)                       Treatment Phase: Abnormal Scans (10/28/20 1200) Barriers/Navigation Needs: Coordination of Care;Underinsured;Family Concerns (10/28/20 1200)   Interventions: Coordination of Care;Disability/FMLA;Referrals (10/28/20 1200) Referrals: Social Work;Pulmonary (10/28/20 1200) Coordination of Care: Appts;Radiology (10/28/20 1200)        Acuity: Level 4-High Needs (Greater Than 4 Barriers Identified) (10/28/20 1200)    met with patient and her husband during hospital follow up visit with Dr. Grayland Ormond to discuss next steps. All questions answered during visit. Reviewed upcoming appts and reassured that will try to schedule appts as soon as possible. Informed that will refer her to social work to discuss assistance and support during treatment. Contact info given and instructed to call with any questions or needs. Pt verbalized understanding.     Time Spent with Patient: 60 (10/28/20 1200)

## 2020-10-29 ENCOUNTER — Other Ambulatory Visit: Payer: Self-pay

## 2020-10-29 ENCOUNTER — Ambulatory Visit
Admission: RE | Admit: 2020-10-29 | Discharge: 2020-10-29 | Disposition: A | Discharge status: Home / Self Care | Payer: 59 | Source: Ambulatory Visit | Attending: Oncology | Admitting: Oncology

## 2020-10-29 ENCOUNTER — Other Ambulatory Visit: Payer: Self-pay | Admitting: *Deleted

## 2020-10-29 ENCOUNTER — Other Ambulatory Visit
Admission: RE | Admit: 2020-10-29 | Discharge: 2020-10-29 | Disposition: A | Discharge status: Home / Self Care | Payer: 59 | Source: Ambulatory Visit | Attending: Oncology | Admitting: Oncology

## 2020-10-29 DIAGNOSIS — Z01812 Encounter for preprocedural laboratory examination: Secondary | ICD-10-CM | POA: Diagnosis not present

## 2020-10-29 DIAGNOSIS — R918 Other nonspecific abnormal finding of lung field: Secondary | ICD-10-CM

## 2020-10-29 LAB — SARS CORONAVIRUS 2 (TAT 6-24 HRS): SARS Coronavirus 2: NEGATIVE

## 2020-10-29 MED ORDER — GADOBUTROL 1 MMOL/ML IV SOLN
9.0000 mL | Freq: Once | INTRAVENOUS | Status: AC | PRN
  Administered 2020-10-29: 9 mL via INTRAVENOUS

## 2020-10-30 ENCOUNTER — Ambulatory Visit
Admission: RE | Admit: 2020-10-30 | Discharge: 2020-10-30 | Disposition: A | Discharge status: Home / Self Care | Payer: 59 | Source: Ambulatory Visit | Attending: Diagnostic Radiology | Admitting: Diagnostic Radiology

## 2020-10-30 ENCOUNTER — Other Ambulatory Visit: Payer: Self-pay | Admitting: Radiology

## 2020-10-30 ENCOUNTER — Ambulatory Visit
Admission: RE | Admit: 2020-10-30 | Discharge: 2020-10-30 | Disposition: A | Discharge status: Home / Self Care | Payer: 59 | Source: Ambulatory Visit | Attending: Oncology | Admitting: Oncology

## 2020-10-30 ENCOUNTER — Other Ambulatory Visit: Payer: Self-pay | Admitting: Diagnostic Radiology

## 2020-10-30 ENCOUNTER — Other Ambulatory Visit: Payer: Self-pay

## 2020-10-30 DIAGNOSIS — Z9889 Other specified postprocedural states: Secondary | ICD-10-CM | POA: Insufficient documentation

## 2020-10-30 DIAGNOSIS — J9859 Other diseases of mediastinum, not elsewhere classified: Secondary | ICD-10-CM

## 2020-10-30 DIAGNOSIS — J90 Pleural effusion, not elsewhere classified: Secondary | ICD-10-CM | POA: Diagnosis not present

## 2020-10-30 LAB — GLUCOSE, CAPILLARY: Glucose-Capillary: 72 mg/dL (ref 70–99)

## 2020-10-30 MED ORDER — FLUDEOXYGLUCOSE F - 18 (FDG) INJECTION
10.9000 | Freq: Once | INTRAVENOUS | Status: AC | PRN
  Administered 2020-10-30: 11.413 via INTRAVENOUS

## 2020-10-30 NOTE — Procedures (Signed)
Interventional Radiology Procedure:   Indications: Recurrent right pleural effusion  Procedure: US guided thoracentesis  Findings: Removed 1300 ml from right chest.  Complications: None     EBL: Less than 10 ml  Plan: Follow up CXR   Moataz Tavis R. Anselm Pancoast, MD  Pager: [phone]

## 2020-10-31 ENCOUNTER — Ambulatory Visit: Admission: RE | Admit: 2020-10-31 | Payer: 59 | Source: Ambulatory Visit

## 2020-11-01 ENCOUNTER — Other Ambulatory Visit: Payer: Self-pay

## 2020-11-01 ENCOUNTER — Other Ambulatory Visit: Payer: Self-pay | Admitting: Student

## 2020-11-01 ENCOUNTER — Other Ambulatory Visit: Payer: Self-pay | Admitting: *Deleted

## 2020-11-01 ENCOUNTER — Other Ambulatory Visit
Admission: RE | Admit: 2020-11-01 | Discharge: 2020-11-01 | Disposition: A | Discharge status: Home / Self Care | Payer: 59 | Source: Ambulatory Visit | Attending: Oncology | Admitting: Oncology

## 2020-11-01 ENCOUNTER — Telehealth: Payer: Self-pay | Admitting: *Deleted

## 2020-11-01 DIAGNOSIS — J90 Pleural effusion, not elsewhere classified: Secondary | ICD-10-CM

## 2020-11-01 DIAGNOSIS — Z20822 Contact with and (suspected) exposure to covid-19: Secondary | ICD-10-CM | POA: Insufficient documentation

## 2020-11-01 DIAGNOSIS — Z01812 Encounter for preprocedural laboratory examination: Secondary | ICD-10-CM | POA: Insufficient documentation

## 2020-11-01 NOTE — Telephone Encounter (Signed)
Pt left message that is having increased cough and wheezing and is asking if she needs to be scheduled for another thoracentesis. Per Sonia Baller, NP, okay to schedule pt for thoracentesis. Orders placed. Pt will be informed of appts once scheduled.

## 2020-11-01 NOTE — Progress Notes (Signed)
Patient on schedule for Mediastinal biopsy 11/04/2020, with thoracentesis before biopsy. Called and spoke with patient who is getting COVID testing today, made aware to be here @ 1130, NPO after 0600, driver for discharge post procedure/recovery, stated understanding.

## 2020-11-01 NOTE — Addendum Note (Signed)
Addended by: Telford Nab on: 11/01/2020 01:37 PM   Modules accepted: Orders, SmartSet

## 2020-11-02 LAB — SARS CORONAVIRUS 2 (TAT 6-24 HRS): SARS Coronavirus 2: NEGATIVE

## 2020-11-04 ENCOUNTER — Ambulatory Visit
Admission: RE | Admit: 2020-11-04 | Discharge: 2020-11-04 | Disposition: A | Discharge status: Home / Self Care | Payer: 59 | Source: Ambulatory Visit | Attending: Oncology | Admitting: Oncology

## 2020-11-04 ENCOUNTER — Other Ambulatory Visit: Payer: Self-pay

## 2020-11-04 ENCOUNTER — Other Ambulatory Visit: Payer: 59

## 2020-11-04 ENCOUNTER — Other Ambulatory Visit: Payer: Self-pay | Admitting: *Deleted

## 2020-11-04 ENCOUNTER — Ambulatory Visit: Admission: RE | Admit: 2020-11-04 | Payer: 59 | Source: Ambulatory Visit

## 2020-11-04 ENCOUNTER — Other Ambulatory Visit: Payer: Self-pay | Admitting: Radiology

## 2020-11-04 ENCOUNTER — Ambulatory Visit
Admission: RE | Admit: 2020-11-04 | Discharge: 2020-11-04 | Disposition: A | Discharge status: Home / Self Care | Payer: 59 | Source: Ambulatory Visit | Attending: Diagnostic Radiology | Admitting: Diagnostic Radiology

## 2020-11-04 DIAGNOSIS — C8522 Mediastinal (thymic) large B-cell lymphoma, intrathoracic lymph nodes: Secondary | ICD-10-CM | POA: Diagnosis not present

## 2020-11-04 DIAGNOSIS — Z9889 Other specified postprocedural states: Secondary | ICD-10-CM

## 2020-11-04 DIAGNOSIS — J90 Pleural effusion, not elsewhere classified: Secondary | ICD-10-CM

## 2020-11-04 DIAGNOSIS — R918 Other nonspecific abnormal finding of lung field: Secondary | ICD-10-CM

## 2020-11-04 DIAGNOSIS — R222 Localized swelling, mass and lump, trunk: Secondary | ICD-10-CM | POA: Diagnosis present

## 2020-11-04 DIAGNOSIS — J9859 Other diseases of mediastinum, not elsewhere classified: Secondary | ICD-10-CM

## 2020-11-04 MED ORDER — FENTANYL CITRATE (PF) 100 MCG/2ML IJ SOLN
INTRAMUSCULAR | Status: AC | PRN
  Administered 2020-11-04 (×2): 50 ug via INTRAVENOUS

## 2020-11-04 MED ORDER — MIDAZOLAM HCL 2 MG/2ML IJ SOLN
INTRAMUSCULAR | Status: AC | PRN
  Administered 2020-11-04 (×2): 1 mg via INTRAVENOUS

## 2020-11-04 MED ORDER — FENTANYL CITRATE (PF) 100 MCG/2ML IJ SOLN
INTRAMUSCULAR | Status: AC
  Filled 2020-11-04: qty 2

## 2020-11-04 MED ORDER — HYDROCODONE-ACETAMINOPHEN 5-325 MG PO TABS: 1.0000 | ORAL_TABLET | ORAL | Status: DC | PRN

## 2020-11-04 MED ORDER — SODIUM CHLORIDE 0.9 % IV SOLN: INTRAVENOUS | Status: DC

## 2020-11-04 MED ORDER — MIDAZOLAM HCL 2 MG/2ML IJ SOLN
INTRAMUSCULAR | Status: AC
  Filled 2020-11-04: qty 2

## 2020-11-04 NOTE — Procedures (Signed)
Interventional Radiology Procedure:    Indications: Mediastinal mass  Procedure: CT guided core biopsy of mediastinal mass  Findings: Biopsy needle directed along left side of sternum and into the mass.  3 cores obtained and placed on Telfa pad with saline.  Complications: None     EBL: less than 10 ml  Plan: Post biopsy CXR and discharge to home in 2 hours.    Anniemae Haberkorn R. Anselm Pancoast, MD  Pager: [phone]

## 2020-11-04 NOTE — Discharge Instructions (Signed)

## 2020-11-04 NOTE — Procedures (Signed)
PROCEDURE SUMMARY:  Successful US guided right thoracentesis. Yielded 900 mL of clear yellow fluid. Pt tolerated procedure well. No immediate complications.  Specimen was not sent for labs. CXR ordered.  EBL < 5 mL  Ascencion Dike PA-C 11/04/2020 12:41 PM

## 2020-11-04 NOTE — Progress Notes (Signed)
Patient clinically stable post Mediastinal biopsy per Dr Anselm Pancoast, tolerated well. Denies complaints at this time. Dozing at intervals. NPO until CXR @ 3406, thereafter po's and d/c 1545. Called and spoke with Roderic Palau over phone with discharge instructions given, to notify DR Finnegan's office before 5PM iv any problems post biopsy. Dressing/bandade dry/intact mid sternal area. Vitals stable. Received Versed 2 mg along with Fentanyl 100 mcg IV for procedure.

## 2020-11-04 NOTE — H&P (Signed)
Chief Complaint: Patient was seen in consultation today for mediastinal mass biopsy and thoracentesis at the request of Dr. Grayland Ormond  Referring Physician(s): Dr. Grayland Ormond  Supervising Physician: Markus Daft  Patient Status: [facility] - Out-pt  History of Present Illness: Jennifer Cole is a 55 y.o. female with large mediastinal mass as well as  recurrent symptomatic right pleural effusion.  She has had a few thoracentesis already but the effusion comes back fairly rapidly, her last was 2/16. She is scheduled for image guided biopsy of the mediastinal mass and repeat thora today. PMHx, meds, labs, imaging, allergies reviewed. Feels okay except difficulty laying flat, develops significant SOB, States she has had to sleep essentially upright in a chair. Has been NPO today as directed.    Past Medical History:  Diagnosis Date  . Cancer [facility])    cervical    History reviewed. No pertinent surgical history.  Allergies: Patient has no known allergies.  Medications: Prior to Admission medications   Medication Sig Start Date End Date Taking? Authorizing Provider  acetaminophen (TYLENOL) 500 MG tablet Take 1 tablet (500 mg total) by mouth every 6 (six) hours as needed. 02/06/19   Law, Bea Graff, PA-C  albuterol (VENTOLIN HFA) 108 (90 Base) MCG/ACT inhaler Inhale 2 puffs into the lungs every 6 (six) hours as needed for wheezing or shortness of breath. 10/28/20   Lloyd Huger, MD  folic acid (FOLVITE) 1 MG tablet Take 1 mg by mouth daily.    [provider]  ibuprofen (ADVIL) 600 MG tablet Take 1 tablet (600 mg total) by mouth every 6 (six) hours as needed. 02/06/19   Frederica Kuster, PA-C     History reviewed. No pertinent family history.  Social History   Socioeconomic History  . Marital status: Legally Separated    Spouse name: Not on file  . Number of children: Not on file  . Years of education: Not on file  . Highest education level: Not on file   Occupational History  . Not on file  Tobacco Use  . Smoking status: Never Smoker  . Smokeless tobacco: Never Used  Substance and Sexual Activity  . Alcohol use: Never  . Drug use: Not on file  . Sexual activity: Not on file  Other Topics Concern  . Not on file  Social History Narrative  . Not on file   Social Determinants of Health   Financial Resource Strain: Not on file  Food Insecurity: Not on file  Transportation Needs: Not on file  Physical Activity: Not on file  Stress: Not on file  Social Connections: Not on file    Review of Systems: A 12 point ROS discussed and pertinent positives are indicated in the HPI above.  All other systems are negative.  Review of Systems  Vital Signs: There were no vitals taken for this visit.  Physical Exam Constitutional:      General: She is not in acute distress.    Appearance: Normal appearance. She is not ill-appearing.  HENT:     Mouth/Throat:     Mouth: Mucous membranes are moist.     Pharynx: Oropharynx is clear.  Cardiovascular:     Rate and Rhythm: Normal rate and regular rhythm.     Heart sounds: Normal heart sounds.  Pulmonary:     Effort: Pulmonary effort is normal. No respiratory distress.     Comments: Diminished (R)BS Skin:    General: Skin is warm and dry.  Neurological:  General: No focal deficit present.     Mental Status: She is alert and oriented to person, place, and time.  Psychiatric:        Mood and Affect: Mood normal.        Thought Content: Thought content normal.        Judgment: Judgment normal.     Imaging: DG Chest 2 View  Result Date: 10/28/2020 CLINICAL DATA:  Shortness of breath EXAM: CHEST - 2 VIEW COMPARISON:  Chest radiograph and chest CT October 23, 2020 FINDINGS: Right pleural effusion, increased in size. Airspace opacity/consolidation with obstruction of the right lower lobe bronchus; CT shows large mass in this area. Left lung is clear. Heart is upper normal in size.  Pulmonary vascularity on the left is normal. Pulmonary vascularity on the right appears grossly normal, although there is soft tissue fullness in this area due to adenopathy which is evident on recent CT. No appreciable bone lesions. IMPRESSION: Pleural effusion on the right has increased in size. Extensive mass with consolidation throughout portions of the right middle lobe region with mass arising from the right mediastinum and potentially pericardium. There is right hilar adenopathy, better delineated by CT. Left lung is clear. Stable cardiac silhouette. Electronically Signed   By: Lowella Grip III M.D.   On: 10/28/2020 11:31   DG Chest 2 View  Result Date: 10/23/2020 CLINICAL DATA:  55 year old female with increasing shortness of breath for 2 days. EXAM: CHEST - 2 VIEW COMPARISON:  None. FINDINGS: Moderate size layering right pleural effusion with dense right lung base opacification. No superimposed pulmonary edema or pneumothorax. The left lung appears to remain clear. Possible cardiomegaly. Right hilum and right heart border are obscured. Furthermore, possible tapered appearance of the right bronchus intermedius. Visualized tracheal air column is within normal limits. No acute osseous abnormality identified. Negative visible bowel gas pattern. Surgical clips in the upper abdomen. IMPRESSION: 1. Moderate size right pleural effusion. Underlying mass or pneumonia not excluded. 2. Questionable cardiomegaly.  No overt edema. Electronically Signed   By: Genevie Ann M.D.   On: 10/23/2020 08:51   CT Angio Chest PE W/Cm &/Or Wo Cm  Result Date: 10/23/2020 CLINICAL DATA:  55 year old female with increasing shortness of breath and abnormal chest x-ray this morning. EXAM: CT ANGIOGRAPHY CHEST WITH CONTRAST TECHNIQUE: Multidetector CT imaging of the chest was performed using the standard protocol during bolus administration of intravenous contrast. Multiplanar CT image reconstructions and MIPs were obtained to  evaluate the vascular anatomy. CONTRAST:  63mL OMNIPAQUE IOHEXOL 350 MG/ML SOLN COMPARISON:  Chest radiographs 10/23/2020. FINDINGS: Cardiovascular: Adequate contrast bolus timing in the pulmonary arterial tree. Respiratory motion degrades detail of the distal left lower lobe pulmonary arteries. Elsewhere no focal filling defect identified in the pulmonary arteries to suggest acute pulmonary embolism. However, there is a large mediastinal mass which severely narrows the right middle and upper lobe pulmonary artery branches (series 6, image 121). The mass infiltrates the superior mediastinum and is also inseparable from the ascending aorta, some of the proximal great vessels, cardiac base and right heart border. No pericardial effusion. No cardiomegaly. Aorta and proximal great vessels remain patent without irregularity. The mass also severely attenuates the right pulmonary veins. Mediastinum/Nodes: Large, infiltrative mediastinal mass occupying the superior and anterior mediastinum tracking inferiorly on the right. The mass encompasses 9.9 x 14.1 x 14.2 cm (AP by transverse by CC). See series 5, image 51 and coronal series 10, image 30. Slightly Discontinuous ex-nodal disease is noted about  the disease great vessels. There is no lymphadenopathy in the visible lower neck or in the axilla. Associated regional mass effect, including on the right airways, see below. Lungs/Pleura: Moderate to large mixed subpleural and layering right pleural effusion with simple fluid density. Compressive atelectasis of virtually the entire right lower lobe. Atelectasis of the right middle lobe is related both to pleural fluid and the large mediastinal mass. There is irregular narrowing of the bronchus intermedius (series 7, image 42) and the other right hilar airways are also encased by tumor. Major airways do remain patent. In the left lung 2 indeterminate although somewhat sub solid appearing pulmonary nodules are identified in the  peripheral upper lobe (series 7, image 28, 4-5 mm) and lower lobe just above the diaphragm (6-7 mm image 71). Trace left pleural fluid. Upper Abdomen: Absent gallbladder. Visible liver, spleen, pancreas, adrenal glands, kidneys and bowel remain within normal limits. Musculoskeletal: No sternal or rib destruction related to the mass. No acute or suspicious osseous lesion is identified. Review of the MIP images confirms the above findings. IMPRESSION: 1. 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 Discontinuous ex-nodal disease in the superior prevascular space. Top differential considerations include small cell carcinoma, lymphoma, and non-small cell bronchogenic carcinoma. 2. Superimposed moderate to large layering and sub-pulmonic right pleural effusion associated right lung atelectasis. 3. Two small indeterminate left lung pulmonary nodules. 4. Negative for acute pulmonary embolus. No metastatic disease identified in the upper abdomen. Electronically Signed   By: Genevie Ann M.D.   On: 10/23/2020 11:30   MR Brain W Wo Contrast  Result Date: 10/30/2020 CLINICAL DATA:  Staging of recently diagnosed lung cancer. EXAM: MRI HEAD WITHOUT AND WITH CONTRAST TECHNIQUE: Multiplanar, multiecho pulse sequences of the brain and surrounding structures were obtained without and with intravenous contrast. CONTRAST:  35mL GADAVIST GADOBUTROL 1 MMOL/ML IV SOLN COMPARISON:  None. FINDINGS: Brain: There is no evidence of an acute infarct, intracranial hemorrhage, mass, midline shift, or extra-axial fluid collection. The ventricles and sulci are normal. A few punctate foci of T2 hyperintensity in the cerebral white matter are nonspecific and considered to be within normal limits for age. No abnormal enhancement is identified. Vascular: Major intracranial vascular flow voids are preserved. Skull and  upper cervical spine: Unremarkable bone marrow signal. Sinuses/Orbits: Unremarkable orbits. Clear paranasal sinuses. Small bilateral mastoid effusions. Other: None. IMPRESSION: Unremarkable appearance of the brain for age. No evidence of intracranial metastases. Electronically Signed   By: Logan Bores M.D.   On: 10/30/2020 08:17   NM PET Image Initial (PI) Skull Base To Thigh  Result Date: 10/30/2020 CLINICAL DATA:  Initial treatment strategy for mediastinal mass. EXAM: NUCLEAR MEDICINE PET SKULL BASE TO THIGH TECHNIQUE: 11.4 mCi F-18 FDG was injected intravenously. Full-ring PET imaging was performed from the skull base to thigh after the radiotracer. CT data was obtained and used for attenuation correction and anatomic localization. Fasting blood glucose: 72 mg/dl COMPARISON:  CT scan 10/30/2020 FINDINGS: Mediastinal blood pool activity: SUV max 1.97 Liver activity: SUV max NA NECK: No neck mass or adenopathy. Incidental CT findings: none CHEST: Hypermetabolic soft tissue mass noted in the left upper chest wall area with ill-defined soft tissue density which could be a nodal mass measures a maximum of 14 mm and the SUV max is 19.07. Small subclavicular lymph node measures 8 mm on image 63/3 and has an SUV max  of 3.37. No breast masses, axillary or subpectoral adenopathy. Large anterior mediastinal mass also invading the middle mediastinum on the right side. The lesion is markedly hypermetabolic with SUV max of 11.94. There is an area of chest wall invasion along the left side of the sternum. There is also a separate pleural nodule on the left side measuring 10 mm with an SUV max of 4.20. A small pleural nodule is also noted posterior to the descending thoracic aorta on image number 74/3. It measures 10 mm and the SUV max is 6.21. Moderate-sized right pleural effusion is noted but no definite hypermetabolic right-sided pleural nodules. A few small indeterminate pulmonary nodules are also noted. Incidental CT  findings: none ABDOMEN/PELVIS: No hypermetabolic solid abdominal organ lesions and no hypermetabolic lymphadenopathy. No inguinal adenopathy. Incidental CT findings: none SKELETON: No osseous lesions are identified. Incidental CT findings: none IMPRESSION: 1. Large markedly hypermetabolic anterior mediastinal mass also involving the middle mediastinum on the right side. Findings suspicious for thymic neoplasm. Lymphoma and germ cell tumors would be other possibilities. The lesion appears to invade the left chest wall along the lateral margin of the sternum. 2. Associated small hypermetabolic pleural nodules on the left side. 3. 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 4. A few small indeterminate pulmonary nodules are noted. 5. Hypermetabolic soft tissue mass in the left chest wall anterior to the left first rib could represent adenopathy. 6. No findings for abdominal/pelvic metastatic disease or osseous metastatic disease. Electronically Signed   By: Marijo Sanes M.D.   On: 10/30/2020 17:06   DG Chest Port 1 View  Result Date: 10/30/2020 CLINICAL DATA:  Status post thoracentesis. EXAM: PORTABLE CHEST 1 VIEW COMPARISON:  10/28/2020 FINDINGS: Interval near complete evacuation of the right pleural fluid collection. No postprocedural pneumothorax is identified. Streaky right basilar atelectasis. IMPRESSION: Near complete evacuation of right pleural fluid collection without postprocedural pneumothorax. Electronically Signed   By: Marijo Sanes M.D.   On: 10/30/2020 13:04   DG Chest Port 1 View  Result Date: 10/23/2020 CLINICAL DATA:  Post thoracentesis EXAM: PORTABLE CHEST 1 VIEW COMPARISON:  CT 10/23/2020 FINDINGS: Interval decrease in the size of the right pleural effusion with some small to moderate volume residual layering fluid in the right lung base. Suspect at least trace left effusion. Residual atelectatic changes are noted in both lungs. Redemonstration of the irregular heterogeneous mediastinal  contours compatible with the infiltrative mediastinal mass seen on comparison CT imaging. Previously seen pulmonary nodules are less well visualized radiographically. No visible pneumothorax. No acute osseous or soft tissue abnormality. Telemetry leads overlie the chest. IMPRESSION: 1. Interval decrease in the size of the right pleural effusion with some small to moderate volume residual layering fluid in the right lung base. No pneumothorax. Suspect at least trace left effusion. 2. Irregular mediastinal margins compatible with the infiltrative mass seen on comparison CT. Electronically Signed   By: Lovena Le M.D.   On: 10/23/2020 16:11   US THORACENTESIS ASP PLEURAL SPACE W/IMG GUIDE  Result Date: 10/30/2020 INDICATION: 55 year old with mediastinal mass and recurrent right pleural effusion. EXAM: ULTRASOUND GUIDED RIGHT THORACENTESIS MEDICATIONS: None. COMPLICATIONS: None immediate. PROCEDURE: An ultrasound guided thoracentesis was thoroughly discussed with the patient and questions answered. The benefits, risks, alternatives and complications were also discussed. The patient understands and wishes to proceed with the procedure. Written consent was obtained. Ultrasound was performed to localize and mark an adequate pocket of fluid in the right chest. The area was then prepped and draped in the normal  sterile fashion. 1% Lidocaine was used for local anesthesia. Under ultrasound guidance a 6 Fr Safe-T-Centesis catheter was introduced. Thoracentesis was performed. The catheter was removed and a dressing applied. FINDINGS: A total of approximately 1.3 L of yellow fluid was removed. IMPRESSION: Successful ultrasound guided right thoracentesis yielding 1.3 L of pleural fluid. Electronically Signed   By: Markus Daft M.D.   On: 10/30/2020 13:29   US THORACENTESIS ASP PLEURAL SPACE W/IMG GUIDE  Result Date: 10/23/2020 INDICATION: RIGHT PLEURAL EFFUSION EXAM: ULTRASOUND GUIDED RIGHT THORACENTESIS MEDICATIONS: 1%  LIDOCAINE LOCAL COMPLICATIONS: None immediate. PROCEDURE: An ultrasound guided thoracentesis was thoroughly discussed with the patient and questions answered. The benefits, risks, alternatives and complications were also discussed. The patient understands and wishes to proceed with the procedure. Written consent was obtained. Ultrasound was performed to localize and mark an adequate pocket of fluid in the right chest. The area was then prepped and draped in the normal sterile fashion. 1% Lidocaine was used for local anesthesia. Under ultrasound guidance a 6 Fr Safe-T-Centesis catheter was introduced. Thoracentesis was performed. The catheter was removed and a dressing applied. FINDINGS: A total of approximately 1.1 L of CLEAR PLEURAL fluid was removed. Samples were sent to the laboratory as requested by the clinical team. IMPRESSION: Successful ultrasound guided right thoracentesis yielding 1.1 L of pleural fluid. Electronically Signed   By: Jerilynn Mages.  Shick M.D.   On: 10/23/2020 15:59    Labs:  CBC: Recent Labs    10/23/20 0752  WBC 6.0  HGB 12.3  HCT 38.4  PLT 322    COAGS: No results for input(s): INR, APTT in the last 8760 hours.  BMP: Recent Labs    10/23/20 0752  NA 141  K 3.9  CL 102  CO2 26  GLUCOSE 130*  BUN 17  CALCIUM 9.5  CREATININE 0.65  GFRNONAA >60    LIVER FUNCTION TESTS: No results for input(s): BILITOT, AST, ALT, ALKPHOS, PROT, ALBUMIN in the last 8760 hours.  TUMOR MARKERS: No results for input(s): AFPTM, CEA, CA199, CHROMGRNA in the last 8760 hours.  Assessment and Plan: Mediastinal mass Recurrent large right pleural effusion. Plan for [place] today, followed by CT guided biopsy of mediastinal mass. Recent labs reviewed. Risks and benefits of mediastinal mass bx and thoracentesis was discussed with the patient and/or patient's family including, but not limited to bleeding, infection, damage to adjacent structures or low yield requiring additional  tests.  All of the questions were answered and there is agreement to proceed.  Consent signed and in chart.    Thank you for this interesting consult.  I greatly enjoyed meeting Tiffiny Worthy and look forward to participating in their care.  A copy of this report was sent to the requesting provider on this date.  Electronically Signed: Ascencion Dike, PA-C 11/04/2020, 11:48 AM   I spent a total of 25 minutes in face to face in clinical consultation, greater than 50% of which was counseling/coordinating care for biopsy and thoracentesis

## 2020-11-04 NOTE — Progress Notes (Signed)
Patient remains clinically stable post procedure with daughter present. Discharge instructions given. Plan is for patient to tentively come in 11/08/2020 for Pleurx catheter  Placement, made aware to be NPO after MN prior to procedure and have driver post procedure/recovery/discharge. Stated understanding. Hayley from Dr Gary Fleet office notified of plan and is to get supplies for patient post Pleurx placement after discharge/home.

## 2020-11-06 ENCOUNTER — Encounter: Payer: Self-pay | Admitting: *Deleted

## 2020-11-06 ENCOUNTER — Other Ambulatory Visit
Admission: RE | Admit: 2020-11-06 | Discharge: 2020-11-06 | Disposition: A | Discharge status: Home / Self Care | Payer: 59 | Source: Ambulatory Visit | Attending: Oncology | Admitting: Oncology

## 2020-11-06 ENCOUNTER — Encounter: Payer: Self-pay | Admitting: Oncology

## 2020-11-06 ENCOUNTER — Other Ambulatory Visit: Payer: Self-pay

## 2020-11-06 ENCOUNTER — Other Ambulatory Visit: Payer: Self-pay | Admitting: Radiology

## 2020-11-06 DIAGNOSIS — Z20822 Contact with and (suspected) exposure to covid-19: Secondary | ICD-10-CM | POA: Insufficient documentation

## 2020-11-06 DIAGNOSIS — Z01812 Encounter for preprocedural laboratory examination: Secondary | ICD-10-CM | POA: Insufficient documentation

## 2020-11-06 LAB — SARS CORONAVIRUS 2 (TAT 6-24 HRS): SARS Coronavirus 2: NEGATIVE

## 2020-11-06 NOTE — Progress Notes (Signed)
  Oncology Nurse Navigator Documentation  Navigator Location: [facility] (11/06/20 1500)   )Navigator Encounter Type: Letter/Fax/Email (11/06/20 1500)                                        Specialty Items/DME: Pleur X catheter (11/06/20 1500)      Pt scheduled for pleurx catheter placement on Friday 11/08/20. Orders for pleurx supplies signed and faxed to [employer] to schedule delivery. Pt made aware.     Time Spent with Patient: 30 (11/06/20 1500)

## 2020-11-07 ENCOUNTER — Ambulatory Visit: Payer: 59 | Admitting: Oncology

## 2020-11-07 ENCOUNTER — Other Ambulatory Visit: Payer: Self-pay | Admitting: Radiology

## 2020-11-08 ENCOUNTER — Ambulatory Visit: Admission: RE | Admit: 2020-11-08 | Payer: 59 | Source: Ambulatory Visit

## 2020-11-08 NOTE — Progress Notes (Signed)
Was asked to contact patient by IR MD to notify that we have been unable to acquire the supply to place pleur x today, however we can drain fluid.  Patient declined to have fluid drained today, stated she would prefer to wait until supply was available to have procedure.  Patient was asked to notify MD if she begins experiencing symptoms before pleur x is rescheduled.

## 2020-11-09 ENCOUNTER — Emergency Department: Payer: 59

## 2020-11-09 ENCOUNTER — Inpatient Hospital Stay
Admission: EM | Admit: 2020-11-09 | Discharge: 2020-11-11 | DRG: 181 | Disposition: A | Discharge status: Home / Self Care | Payer: 59 | Attending: Internal Medicine | Admitting: Internal Medicine

## 2020-11-09 ENCOUNTER — Encounter: Payer: Self-pay | Admitting: Emergency Medicine

## 2020-11-09 ENCOUNTER — Other Ambulatory Visit: Payer: Self-pay

## 2020-11-09 DIAGNOSIS — R6510 Systemic inflammatory response syndrome (SIRS) of non-infectious origin without acute organ dysfunction: Secondary | ICD-10-CM | POA: Diagnosis present

## 2020-11-09 DIAGNOSIS — J910 Malignant pleural effusion: Secondary | ICD-10-CM | POA: Diagnosis present

## 2020-11-09 DIAGNOSIS — Z8541 Personal history of malignant neoplasm of cervix uteri: Secondary | ICD-10-CM

## 2020-11-09 DIAGNOSIS — Z20822 Contact with and (suspected) exposure to covid-19: Secondary | ICD-10-CM | POA: Diagnosis present

## 2020-11-09 DIAGNOSIS — C3491 Malignant neoplasm of unspecified part of right bronchus or lung: Secondary | ICD-10-CM | POA: Diagnosis not present

## 2020-11-09 DIAGNOSIS — J9601 Acute respiratory failure with hypoxia: Secondary | ICD-10-CM

## 2020-11-09 DIAGNOSIS — R918 Other nonspecific abnormal finding of lung field: Secondary | ICD-10-CM

## 2020-11-09 DIAGNOSIS — Z6834 Body mass index (BMI) 34.0-34.9, adult: Secondary | ICD-10-CM

## 2020-11-09 DIAGNOSIS — J90 Pleural effusion, not elsewhere classified: Secondary | ICD-10-CM | POA: Diagnosis not present

## 2020-11-09 DIAGNOSIS — E669 Obesity, unspecified: Secondary | ICD-10-CM | POA: Diagnosis present

## 2020-11-09 DIAGNOSIS — Z79899 Other long term (current) drug therapy: Secondary | ICD-10-CM

## 2020-11-09 LAB — BASIC METABOLIC PANEL
Anion gap: 11 (ref 5–15)
BUN: 17 mg/dL (ref 6–20)
CO2: 27 mmol/L (ref 22–32)
Calcium: 9.5 mg/dL (ref 8.9–10.3)
Chloride: 101 mmol/L (ref 98–111)
Creatinine, Ser: 0.72 mg/dL (ref 0.44–1.00)
GFR, Estimated: >60 mL/min (ref >60)
Glucose, Bld: 120 mg/dL — ABNORMAL HIGH (ref 70–99)
Potassium: 4.3 mmol/L (ref 3.5–5.1)
Sodium: 139 mmol/L (ref 135–145)

## 2020-11-09 LAB — RESP PANEL BY RT-PCR (FLU A&B, COVID) ARPGX2
Influenza A by PCR: NEGATIVE
Influenza B by PCR: NEGATIVE
SARS Coronavirus 2 by RT PCR: NEGATIVE

## 2020-11-09 LAB — CBC
HCT: 39.5 % (ref 36.0–46.0)
Hemoglobin: 12.7 g/dL (ref 12.0–15.0)
MCH: 28.9 pg (ref 26.0–34.0)
MCHC: 32.2 g/dL (ref 30.0–36.0)
MCV: 90 fL (ref 80.0–100.0)
Platelets: 327 10*3/uL (ref 150–400)
RBC: 4.39 MIL/uL (ref 3.87–5.11)
RDW: 13.6 % (ref 11.5–15.5)
WBC: 5.5 10*3/uL (ref 4.0–10.5)
nRBC: 0 % (ref 0.0–0.2)

## 2020-11-09 LAB — POC URINE PREG, ED: Preg Test, Ur: NEGATIVE

## 2020-11-09 LAB — HIV ANTIBODY (ROUTINE TESTING W REFLEX): HIV Screen 4th Generation wRfx: NONREACTIVE

## 2020-11-09 MED ORDER — ONDANSETRON HCL 4 MG/2ML IJ SOLN: 4.0000 mg | Freq: Four times a day (QID) | INTRAMUSCULAR | Status: DC | PRN

## 2020-11-09 MED ORDER — ACETAMINOPHEN 325 MG PO TABS
325.0000 mg | ORAL_TABLET | Freq: Four times a day (QID) | ORAL | Status: DC | PRN
  Administered 2020-11-10 (×2): 325 mg via ORAL
  Filled 2020-11-09 (×2): qty 1

## 2020-11-09 MED ORDER — ONDANSETRON HCL 4 MG PO TABS: 4.0000 mg | ORAL_TABLET | Freq: Four times a day (QID) | ORAL | Status: DC | PRN

## 2020-11-09 MED ORDER — ENOXAPARIN SODIUM 60 MG/0.6ML ~~LOC~~ SOLN
0.5000 mg/kg | SUBCUTANEOUS | Status: DC
  Administered 2020-11-09: 20:00:00 47.5 mg via SUBCUTANEOUS
  Filled 2020-11-09: qty 0.6

## 2020-11-09 MED ORDER — ACETAMINOPHEN 650 MG RE SUPP: 325.0000 mg | Freq: Four times a day (QID) | RECTAL | Status: DC | PRN

## 2020-11-09 MED ORDER — ALBUTEROL SULFATE HFA 108 (90 BASE) MCG/ACT IN AERS
2.0000 | INHALATION_SPRAY | Freq: Four times a day (QID) | RESPIRATORY_TRACT | Status: DC | PRN
  Filled 2020-11-09 (×2): qty 6.7

## 2020-11-09 NOTE — H&P (View-Only) (Signed)
[place]  [phone] [phone]  ID: Jennifer Cole OB: 1965-12-09  MR#: [id number]  YQM#:[id number]  Patient Care Team: Patient, [name] as PCP - General ([place]) Telford Nab, RN as Oncology Nurse Navigator  CHIEF COMPLAINT: Primary mediastinal B-cell lymphoma  INTERVAL HISTORY: Patient returns to clinic today to discuss her pathology results and treatment planning.  Her shortness of breath is mildly improved since placement of Pleurx catheter, but she continues to have significant right flank pain at the site of her catheter.  She has increased weakness and fatigue.  She continues to have a chronic cough as well.  She has no neurologic complaints.  She denies any recent fevers.  She has a fair appetite, but denies weight loss.  She has no chest pain or hemoptysis.  She denies any nausea, vomiting, constipation, or diarrhea.  She has no urinary complaints.  Patient offers no further specific complaints today.  REVIEW OF SYSTEMS:   Review of Systems  Constitutional: Positive for malaise/fatigue. Negative for fever and weight loss.  Respiratory: Positive for cough and shortness of breath. Negative for hemoptysis.   Cardiovascular: Negative.  Negative for chest pain and leg swelling.  Gastrointestinal: Negative.  Negative for abdominal pain.  Genitourinary: Negative.  Negative for dysuria.  Musculoskeletal: Negative.  Negative for back pain.  Skin: Negative.   Neurological: Positive for weakness. Negative for dizziness, focal weakness and headaches.  Psychiatric/Behavioral: Negative.  The patient is not nervous/anxious.     As per HPI. Otherwise, a complete review of systems is negative.  PAST MEDICAL HISTORY: Past Medical History:  Diagnosis Date  . Cancer ([place])    cervical    PAST SURGICAL HISTORY: Past Surgical History:  Procedure Laterality Date  . IR PERC PLEURAL DRAIN W/INDWELL CATH W/IMG GUIDE  11/11/2020     FAMILY HISTORY: History reviewed. No pertinent family history.  ADVANCED DIRECTIVES (Y/N):  N  HEALTH MAINTENANCE: Social History   Tobacco Use  . Smoking status: Never Smoker  . Smokeless tobacco: Never Used  Substance Use Topics  . Alcohol use: Never     Colonoscopy:  PAP:  Bone density:  Lipid panel:  No Known Allergies  Current Outpatient Medications  Medication Sig Dispense Refill  . acetaminophen (TYLENOL) 500 MG tablet Take 1 tablet (500 mg total) by mouth every 6 (six) hours as needed. 30 tablet 0  . albuterol (VENTOLIN HFA) 108 (90 Base) MCG/ACT inhaler Inhale 2 puffs into the lungs every 6 (six) hours as needed for wheezing or shortness of breath. 8 g 2  . folic acid (FOLVITE) 1 MG tablet Take 1 mg by mouth daily.    Marland Kitchen HYDROcodone-acetaminophen (NORCO/VICODIN) 5-325 MG tablet Take 1 tablet by mouth every 6 (six) hours as needed for up to 5 days for moderate pain or severe pain. 20 tablet 0  . ibuprofen (ADVIL) 600 MG tablet Take 1 tablet (600 mg total) by mouth every 6 (six) hours as needed. 30 tablet 0  . allopurinol (ZYLOPRIM) 300 MG tablet Take 1 tablet (300 mg total) by mouth daily. 30 tablet 3  . lidocaine-prilocaine (EMLA) cream Apply to affected area once 30 g 3  . ondansetron (ZOFRAN) 8 MG tablet Take 1 tablet (8 mg total) by mouth 2 (two) times daily as needed for refractory nausea / vomiting. 60 tablet 2  . predniSONE (DELTASONE) 20 MG tablet Take 5 tablets (100 mg total) by mouth daily. Take with food on days 1-5 of chemotherapy. 25 tablet  5  . prochlorperazine (COMPAZINE) 10 MG tablet Take 1 tablet (10 mg total) by mouth every 6 (six) hours as needed (Nausea or vomiting). 60 tablet 2   No current facility-administered medications for this visit.    OBJECTIVE: Vitals:   11/12/20 1111  BP: 97/71  Pulse: 96  Resp: 20  Temp: 99 F (37.2 C)  SpO2: 97%     Body mass index is 35.15 kg/m.    ECOG FS:1 - Symptomatic but completely  ambulatory  General: Well-developed, well-nourished, no acute distress. Eyes: Pink conjunctiva, anicteric sclera. HEENT: Normocephalic, moist mucous membranes. Lungs: No audible wheezing or coughing. Heart: Regular rate and rhythm. Abdomen: Soft, nontender, no obvious distention. Musculoskeletal: No edema, cyanosis, or clubbing.  Pleurx catheter in place in right flank.  No erythema or drainage. Neuro: Alert, answering all questions appropriately. Cranial nerves grossly intact. Skin: No rashes or petechiae noted. Psych: Normal affect.   LAB RESULTS:  Lab Results  Component Value Date   NA 137 11/10/2020   K 3.8 11/10/2020   CL 103 11/10/2020   CO2 24 11/10/2020   GLUCOSE 99 11/10/2020   BUN 16 11/10/2020   CREATININE 0.63 11/10/2020   CALCIUM 9.3 11/10/2020   GFRNONAA >60 11/10/2020    Lab Results  Component Value Date   WBC 4.9 11/10/2020   NEUTROABS 4.5 10/23/2020   HGB 12.2 11/10/2020   HCT 38.2 11/10/2020   MCV 90.1 11/10/2020   PLT 337 11/10/2020     STUDIES: DG Chest 2 View  Result Date: 11/09/2020 CLINICAL DATA:  Shortness of breath.  History of lung cancer EXAM: CHEST - 2 VIEW COMPARISON:  October 23, 2020, November 04, 2020 FINDINGS: The cardiomediastinal silhouette is unchanged in contour with unchanged irregular RIGHT perihilar contours and opacity of the anterior mediastinum.There is a moderate RIGHT pleural effusion, increased in comparison to prior. Trace LEFT pleural effusion. No pneumothorax. Homogeneous opacification of the RIGHT lung base, likely atelectasis. Status post cholecystectomy. Mild degenerative changes of the thoracic spine. IMPRESSION: 1. Interval increase in size of a moderate RIGHT pleural effusion. 2. Revisualization of malignancy involving the anterior mediastinum and extending along the RIGHT perihilar border. Electronically Signed   By: Valentino Saxon MD   On: 11/09/2020 11:50   DG Chest 2 View  Result Date: 10/28/2020 CLINICAL  DATA:  Shortness of breath EXAM: CHEST - 2 VIEW COMPARISON:  Chest radiograph and chest CT October 23, 2020 FINDINGS: Right pleural effusion, increased in size. Airspace opacity/consolidation with obstruction of the right lower lobe bronchus; CT shows large mass in this area. Left lung is clear. Heart is upper normal in size. Pulmonary vascularity on the left is normal. Pulmonary vascularity on the right appears grossly normal, although there is soft tissue fullness in this area due to adenopathy which is evident on recent CT. No appreciable bone lesions. IMPRESSION: Pleural effusion on the right has increased in size. Extensive mass with consolidation throughout portions of the right middle lobe region with mass arising from the right mediastinum and potentially pericardium. There is right hilar adenopathy, better delineated by CT. Left lung is clear. Stable cardiac silhouette. Electronically Signed   By: Lowella Grip III M.D.   On: 10/28/2020 11:31   DG Chest 2 View  Result Date: 10/23/2020 CLINICAL DATA:  55 year old female with increasing shortness of breath for 2 days. EXAM: CHEST - 2 VIEW COMPARISON:  None. FINDINGS: Moderate size layering right pleural effusion with dense right lung base opacification. No superimposed pulmonary edema  or pneumothorax. The left lung appears to remain clear. Possible cardiomegaly. Right hilum and right heart border are obscured. Furthermore, possible tapered appearance of the right bronchus intermedius. Visualized tracheal air column is within normal limits. No acute osseous abnormality identified. Negative visible bowel gas pattern. Surgical clips in the upper abdomen. IMPRESSION: 1. Moderate size right pleural effusion. Underlying mass or pneumonia not excluded. 2. Questionable cardiomegaly.  No overt edema. Electronically Signed   By: Genevie Ann M.D.   On: 10/23/2020 08:51   CT Angio Chest PE W/Cm &/Or Wo Cm  Result Date: 10/23/2020 CLINICAL DATA:  55 year old female  with increasing shortness of breath and abnormal chest x-ray this morning. EXAM: CT ANGIOGRAPHY CHEST WITH CONTRAST TECHNIQUE: Multidetector CT imaging of the chest was performed using the standard protocol during bolus administration of intravenous contrast. Multiplanar CT image reconstructions and MIPs were obtained to evaluate the vascular anatomy. CONTRAST:  9m OMNIPAQUE IOHEXOL 350 MG/ML SOLN COMPARISON:  Chest radiographs 10/23/2020. FINDINGS: Cardiovascular: Adequate contrast bolus timing in the pulmonary arterial tree. Respiratory motion degrades detail of the distal left lower lobe pulmonary arteries. Elsewhere no focal filling defect identified in the pulmonary arteries to suggest acute pulmonary embolism. However, there is a large mediastinal mass which severely narrows the right middle and upper lobe pulmonary artery branches (series 6, image 121). The mass infiltrates the superior mediastinum and is also inseparable from the ascending aorta, some of the proximal great vessels, cardiac base and right heart border. No pericardial effusion. No cardiomegaly. Aorta and proximal great vessels remain patent without irregularity. The mass also severely attenuates the right pulmonary veins. Mediastinum/Nodes: Large, infiltrative mediastinal mass occupying the superior and anterior mediastinum tracking inferiorly on the right. The mass encompasses 9.9 x 14.1 x 14.2 cm (AP by transverse by CC). See series 5, image 51 and coronal series 10, image 30. Slightly Discontinuous ex-nodal disease is noted about the disease great vessels. There is no lymphadenopathy in the visible lower neck or in the axilla. Associated regional mass effect, including on the right airways, see below. Lungs/Pleura: Moderate to large mixed subpleural and layering right pleural effusion with simple fluid density. Compressive atelectasis of virtually the entire right lower lobe. Atelectasis of the right middle lobe is related both to pleural  fluid and the large mediastinal mass. There is irregular narrowing of the bronchus intermedius (series 7, image 42) and the other right hilar airways are also encased by tumor. Major airways do remain patent. In the left lung 2 indeterminate although somewhat sub solid appearing pulmonary nodules are identified in the peripheral upper lobe (series 7, image 28, 4-5 mm) and lower lobe just above the diaphragm (6-7 mm image 71). Trace left pleural fluid. Upper Abdomen: Absent gallbladder. Visible liver, spleen, pancreas, adrenal glands, kidneys and bowel remain within normal limits. Musculoskeletal: No sternal or rib destruction related to the mass. No acute or suspicious osseous lesion is identified. Review of the MIP images confirms the above findings. IMPRESSION: 1. 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 Discontinuous ex-nodal disease in the superior prevascular space. Top differential considerations include small cell carcinoma, lymphoma, and non-small cell bronchogenic carcinoma. 2. Superimposed moderate to large layering and sub-pulmonic right pleural effusion associated right lung atelectasis. 3. Two small indeterminate left lung pulmonary nodules. 4. Negative for acute pulmonary embolus. No metastatic disease identified in the upper abdomen. Electronically Signed   By:  Genevie Ann M.D.   On: 10/23/2020 11:30   MR Brain W Wo Contrast  Result Date: 10/30/2020 CLINICAL DATA:  Staging of recently diagnosed lung cancer. EXAM: MRI HEAD WITHOUT AND WITH CONTRAST TECHNIQUE: Multiplanar, multiecho pulse sequences of the brain and surrounding structures were obtained without and with intravenous contrast. CONTRAST:  95m GADAVIST GADOBUTROL 1 MMOL/ML IV SOLN COMPARISON:  None. FINDINGS: Brain: There is no evidence of an acute infarct, intracranial hemorrhage, mass, midline shift,  or extra-axial fluid collection. The ventricles and sulci are normal. A few punctate foci of T2 hyperintensity in the cerebral white matter are nonspecific and considered to be within normal limits for age. No abnormal enhancement is identified. Vascular: Major intracranial vascular flow voids are preserved. Skull and upper cervical spine: Unremarkable bone marrow signal. Sinuses/Orbits: Unremarkable orbits. Clear paranasal sinuses. Small bilateral mastoid effusions. Other: None. IMPRESSION: Unremarkable appearance of the brain for age. No evidence of intracranial metastases. Electronically Signed   By: ALogan BoresM.D.   On: 10/30/2020 08:17   NM PET Image Initial (PI) Skull Base To Thigh  Result Date: 10/30/2020 CLINICAL DATA:  Initial treatment strategy for mediastinal mass. EXAM: NUCLEAR MEDICINE PET SKULL BASE TO THIGH TECHNIQUE: 11.4 mCi F-18 FDG was injected intravenously. Full-ring PET imaging was performed from the skull base to thigh after the radiotracer. CT data was obtained and used for attenuation correction and anatomic localization. Fasting blood glucose: 72 mg/dl COMPARISON:  CT scan 10/30/2020 FINDINGS: Mediastinal blood pool activity: SUV max 1.97 Liver activity: SUV max NA NECK: No neck mass or adenopathy. Incidental CT findings: none CHEST: Hypermetabolic soft tissue mass noted in the left upper chest wall area with ill-defined soft tissue density which could be a nodal mass measures a maximum of 14 mm and the SUV max is 19.07. Small subclavicular lymph node measures 8 mm on image 63/3 and has an SUV max of 3.37. No breast masses, axillary or subpectoral adenopathy. Large anterior mediastinal mass also invading the middle mediastinum on the right side. The lesion is markedly hypermetabolic with SUV max of 388.50 There is an area of chest wall invasion along the left side of the sternum. There is also a separate pleural nodule on the left side measuring 10 mm with an SUV max of 4.20. A small  pleural nodule is also noted posterior to the descending thoracic aorta on image number 74/3. It measures 10 mm and the SUV max is 6.21. Moderate-sized right pleural effusion is noted but no definite hypermetabolic right-sided pleural nodules. A few small indeterminate pulmonary nodules are also noted. Incidental CT findings: none ABDOMEN/PELVIS: No hypermetabolic solid abdominal organ lesions and no hypermetabolic lymphadenopathy. No inguinal adenopathy. Incidental CT findings: none SKELETON: No osseous lesions are identified. Incidental CT findings: none IMPRESSION: 1. Large markedly hypermetabolic anterior mediastinal mass also involving the middle mediastinum on the right side. Findings suspicious for thymic neoplasm. Lymphoma and germ cell tumors would be other possibilities. The lesion appears to invade the left chest wall along the lateral margin of the sternum. 2. Associated small hypermetabolic pleural nodules on the left side. 3. 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 4. A few small indeterminate pulmonary nodules are noted. 5. Hypermetabolic soft tissue mass in the left chest wall anterior to the left first rib could represent adenopathy. 6. No findings for abdominal/pelvic metastatic disease or osseous metastatic disease. Electronically Signed   By: PMarijo SanesM.D.   On: 10/30/2020 17:06   CT BIOPSY  Result Date: 11/04/2020 INDICATION:  55 year old with a large mediastinal mass. Tissue diagnosis is needed. EXAM: CT-GUIDED CORE BIOPSY OF MEDIASTINAL MASS MEDICATIONS: None. ANESTHESIA/SEDATION: Moderate (conscious) sedation was employed during this procedure. A total of Versed 2.0 mg and Fentanyl 100 mcg was administered intravenously. Moderate Sedation Time: 16 minutes. The patient's level of consciousness and vital signs were monitored continuously by radiology nursing throughout the procedure under my direct supervision. FLUOROSCOPY TIME:  Fluoroscopy Time: None COMPLICATIONS: None immediate.  PROCEDURE: Informed written consent was obtained from the patient after a thorough discussion of the procedural risks, benefits and alternatives. All questions were addressed. A timeout was performed prior to the initiation of the procedure. Patient was placed supine on the CT scanner. CT images through the chest were obtained. The anterior chest was prepped with chlorhexidine and sterile field was created. Skin and soft tissues were anesthetized with 1% lidocaine. Using CT guidance, a 17 gauge coaxial needle was directed into the anterior mediastinal mass along the left side of the sternum. Needle was directed into the mediastinal mass and needle position was confirmed with CT. Three core biopsies were obtained with an 18 gauge core device. Specimens were placed on Telfa pad with saline. Needle was removed without complication and follow up CT images were obtained. Bandage placed over the puncture site. FINDINGS: Again noted is a large anterior mediastinal mass with low-density along the right side of the mass. Biopsy needle was directed into the mass along the left side of the sternum. Needle position confirmed within the mediastinal mass. No significant bleeding or hematoma formation at the end of the procedure. Small loculated right pleural effusion. Tiny focus of gas within the right pleural effusion related to recent thoracentesis. No significant pleural gas. Again noted is a small nodule in the left upper lobe measuring close to 6 mm. IMPRESSION: CT-guided core biopsy of the anterior mediastinal mass. Electronically Signed   By: Markus Daft M.D.   On: 11/04/2020 15:34   DG Chest Port 1 View  Result Date: 11/04/2020 CLINICAL DATA:  Status post thoracentesis EXAM: PORTABLE CHEST 1 VIEW COMPARISON:  October 30, 2020. FINDINGS: No pneumothorax evident. Small right pleural effusion evident. There is mild right base atelectasis. The lungs elsewhere are clear. There is stable cardiomegaly with pulmonary  vascularity normal. No adenopathy. No bone lesions. IMPRESSION: No appreciable pneumothorax. Small right pleural effusion with right base atelectasis. No consolidation. Stable cardiomegaly. Electronically Signed   By: Lowella Grip III M.D.   On: 11/04/2020 15:15   DG Chest Port 1 View  Result Date: 10/30/2020 CLINICAL DATA:  Status post thoracentesis. EXAM: PORTABLE CHEST 1 VIEW COMPARISON:  10/28/2020 FINDINGS: Interval near complete evacuation of the right pleural fluid collection. No postprocedural pneumothorax is identified. Streaky right basilar atelectasis. IMPRESSION: Near complete evacuation of right pleural fluid collection without postprocedural pneumothorax. Electronically Signed   By: Marijo Sanes M.D.   On: 10/30/2020 13:04   DG Chest Port 1 View  Result Date: 10/23/2020 CLINICAL DATA:  Post thoracentesis EXAM: PORTABLE CHEST 1 VIEW COMPARISON:  CT 10/23/2020 FINDINGS: Interval decrease in the size of the right pleural effusion with some small to moderate volume residual layering fluid in the right lung base. Suspect at least trace left effusion. Residual atelectatic changes are noted in both lungs. Redemonstration of the irregular heterogeneous mediastinal contours compatible with the infiltrative mediastinal mass seen on comparison CT imaging. Previously seen pulmonary nodules are less well visualized radiographically. No visible pneumothorax. No acute osseous or soft tissue abnormality. Telemetry leads overlie  the chest. IMPRESSION: 1. Interval decrease in the size of the right pleural effusion with some small to moderate volume residual layering fluid in the right lung base. No pneumothorax. Suspect at least trace left effusion. 2. Irregular mediastinal margins compatible with the infiltrative mass seen on comparison CT. Electronically Signed   By: Lovena Le M.D.   On: 10/23/2020 16:11   IR PERC PLEURAL DRAIN W/INDWELL CATH W/IMG GUIDE  Result Date: 11/11/2020 CLINICAL DATA:   54 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 EXAM: INSERTION OF TUNNELED right SIDED PLEURAL DRAINAGE CATHETER COMPARISON:  11/09/2020 MEDICATIONS: Ancef 2 gm IV; Antibiotic was administered in an appropriate time interval for the procedure. ANESTHESIA/SEDATION: Moderate (conscious) sedation was employed during this procedure. A total of Versed 3 mg and Fentanyl 150 mcg was administered intravenously. Moderate Sedation Time: 34 minutes. The patient's level of consciousness and vital signs were monitored continuously by radiology nursing throughout the procedure under my direct supervision. FLUOROSCOPY TIME:  FLUOROSCOPY TIME 0.4 min (8.6 mGy) COMPLICATIONS: None immediate. PROCEDURE: The procedure, risks, benefits, and alternatives were explained to the patient, who wishes to proceed with the placement of this permanent pleural catheter as they are seeking palliative care. The patient understands and consents to the procedure. The right lateral chest and upper abdomen were prepped and draped in a sterile fashion, and a sterile drape was applied covering the operative field. A sterile gown and sterile gloves were used for the procedure. Initial ultrasound scanning and fluoroscopic imaging demonstrates a recurrent moderate to large pleural effusion. Under direct ultrasound guidance, the inferior lateral pleural space was accessed with a Yueh sheath needle after the overlying soft tissues were anesthetized with 1% lidocaine with epinephrine. A Rosen wire was then advanced under fluoroscopy into the pleural space. A 15.5 French tunneled Pleur-X catheter was tunneled from an incision in the right upper abdominal quadrant to the access site. The pleural access site was serially dilated under fluoroscopy, ultimately allowing placement of a peel-away sheath. The catheter was advanced through the peel-away sheath. The sheath was then removed. Final catheter positioning was confirmed with a fluoroscopic  radiographic image. The access incision was closed with Dermabond. A 0 silk retention suture was applied at the catheter exit site. Thoracentesis was performed through the new catheter utilizing provided bulb vacuum assisted drainage bag. Dressings were applied. The patient tolerated the above procedure well without immediate postprocedural complication. FINDINGS: Preprocedural ultrasound scanning demonstrates a recurrent moderate sized right sided pleural effusion. After ultrasound and fluoroscopic guided placement, the catheter is directed superiorly Following catheter placement, approximately 50 cc of translucent, straw-colored pleural fluid was removed. IMPRESSION: Successful placement of permanent, tunneled right pleural drainage catheter via lateral approach. 50 mL of translucent, straw-colored pleural fluid was removed following catheter placement. Ruthann Cancer, MD [facility] Electronically Signed   By: Ruthann Cancer MD   On: 11/11/2020 15:00   US THORACENTESIS ASP PLEURAL SPACE W/IMG GUIDE  Result Date: 11/04/2020 INDICATION: Mediastinal mass. Shortness of breath. Recurrent right pleural effusion. Request for therapeutic thoracentesis. EXAM: ULTRASOUND GUIDED RIGHT THORACENTESIS MEDICATIONS: 1% plain lidocaine, 10 mL COMPLICATIONS: None immediate. PROCEDURE: An ultrasound guided thoracentesis was thoroughly discussed with the patient and questions answered. The benefits, risks, alternatives and complications were also discussed. The patient understands and wishes to proceed with the procedure. Written consent was obtained. Ultrasound was performed to localize and mark an adequate pocket of fluid in the right chest. The area was then prepped and draped in  the normal sterile fashion. 1% Lidocaine was used for local anesthesia. Under ultrasound guidance a 6 Fr Safe-T-Centesis catheter was introduced. Thoracentesis was performed. The catheter was  removed and a dressing applied. FINDINGS: A total of approximately 900 mL of clear yellow fluid was removed. Ultrasound of the right chest demonstrates right pleural effusion with interval development of loculations. IMPRESSION: Successful ultrasound guided right thoracentesis yielding 900 mL of pleural fluid. Read by: Ascencion Dike PA-C Electronically Signed   By: Markus Daft M.D.   On: 11/04/2020 12:52   US THORACENTESIS ASP PLEURAL SPACE W/IMG GUIDE  Result Date: 10/30/2020 INDICATION: 55 year old with mediastinal mass and recurrent right pleural effusion. EXAM: ULTRASOUND GUIDED RIGHT THORACENTESIS MEDICATIONS: None. COMPLICATIONS: None immediate. PROCEDURE: An ultrasound guided thoracentesis was thoroughly discussed with the patient and questions answered. The benefits, risks, alternatives and complications were also discussed. The patient understands and wishes to proceed with the procedure. Written consent was obtained. Ultrasound was performed to localize and mark an adequate pocket of fluid in the right chest. The area was then prepped and draped in the normal sterile fashion. 1% Lidocaine was used for local anesthesia. Under ultrasound guidance a 6 Fr Safe-T-Centesis catheter was introduced. Thoracentesis was performed. The catheter was removed and a dressing applied. FINDINGS: A total of approximately 1.3 L of yellow fluid was removed. IMPRESSION: Successful ultrasound guided right thoracentesis yielding 1.3 L of pleural fluid. Electronically Signed   By: Markus Daft M.D.   On: 10/30/2020 13:29   US THORACENTESIS ASP PLEURAL SPACE W/IMG GUIDE  Result Date: 10/23/2020 INDICATION: RIGHT PLEURAL EFFUSION EXAM: ULTRASOUND GUIDED RIGHT THORACENTESIS MEDICATIONS: 1% LIDOCAINE LOCAL COMPLICATIONS: None immediate. PROCEDURE: An ultrasound guided thoracentesis was thoroughly discussed with the patient and questions answered. The benefits, risks, alternatives and complications were also discussed. The patient  understands and wishes to proceed with the procedure. Written consent was obtained. Ultrasound was performed to localize and mark an adequate pocket of fluid in the right chest. The area was then prepped and draped in the normal sterile fashion. 1% Lidocaine was used for local anesthesia. Under ultrasound guidance a 6 Fr Safe-T-Centesis catheter was introduced. Thoracentesis was performed. The catheter was removed and a dressing applied. FINDINGS: A total of approximately 1.1 L of CLEAR PLEURAL fluid was removed. Samples were sent to the laboratory as requested by the clinical team. IMPRESSION: Successful ultrasound guided right thoracentesis yielding 1.1 L of pleural fluid. Electronically Signed   By: Jerilynn Mages.  Shick M.D.   On: 10/23/2020 15:59    ASSESSMENT: Primary mediastinal B-cell lymphoma   PLAN:    1. Primary mediastinal B-cell lymphoma: Imaging and pathology reviewed independently and also discussed with radiology and pathologist.  Patient will require bone marrow biopsy to complete staging work-up.  She will also require cardiac echo and port placement prior to initiating treatment.  Patient will receive 6 cycles of R-CHOP with Neulasta support every 3 weeks followed by consolidation XRT.  Return to clinic on Wednesday, November 20, 2020 to initiate cycle 1 of 6. 2.  Shortness of breath: Secondary to pleural effusion from lymphoma.  Patient has Pleurx catheter in place. 3.  Pain: Patient states she has a prescription at the pharmacy that she needs to pick up later today.  She has been instructed to call clinic if she needs refills or medication is not adequate. 4.  Headache: Patient does not complain of this today.  MRI of the brain on October 30, 2020 did not report any significant pathology.  I  spent a total of 30 minutes reviewing chart data, face-to-face evaluation with the patient, counseling and coordination of care as detailed above.   Patient expressed understanding and was in agreement with  this plan. She also understands that She can call clinic at any time with any questions, concerns, or complaints.   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

## 2020-11-09 NOTE — ED Notes (Signed)
First RN Note: Pt to ED via POV, states is currently being treated for lung cancer. Pt c/o SOB at this time. Pt states she thinks the "fluid is back on her [her] lung". Upon review of chart pt noted have had an US thoracentesis on 2/21.

## 2020-11-09 NOTE — ED Notes (Signed)
Pt mentioned she was supposed to get a pigtail drain placed to R chest on Friday, but materials were unavailable outpatient that day

## 2020-11-09 NOTE — ED Provider Notes (Signed)
[facility] Emergency Department Provider Note  ____________________________________________   Event Date/Time   First MD Initiated Contact with Patient 11/09/20 1300     (approximate)  I have reviewed the triage vital signs and the nursing notes.   HISTORY  Chief Complaint Shortness of Breath    HPI Jennifer Cole is a 55 y.o. female with cervical cancer and lung cancer who comes in for shortness of breath.  Patient states that she has had worsening shortness of breath over the past few days, worse with exertion, better at rest.  No chest pain, no leg swelling.  States this feels just like when she is had her effusions in the past.  Get better with drainage.  On review of records patient was last in the ER 2/9 when she was found to have an effusion.  Dr. Grayland Ormond from oncology was consulted given patient did not want to stay in the ER he was to follow-up with patient outpatient.  IR performed thoracentesis and patient was able to be discharged home.  At that time she did have a CT scan and done that did not show evidence of PE.  Appears that patient had a thoracentesis on 2/16 and 2/21.  It appears that patient is planning to have a pleural drain placed on 2/28          Past Medical History:  Diagnosis Date  . Cancer [facility])    cervical    Patient Active Problem List   Diagnosis Date Noted  . Mass of right lung 10/25/2020  . Callus of foot 06/29/2019  . Metatarsalgia, left foot 06/29/2019  . Mechanical complic of internal orthopedic device, implant or graft ([place]) 01/04/2019  . 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 Hammer toe of right foot 06/06/2018  . Foot pain, right 06/06/2018  . Chronic pain of both knees 03/21/2018  . 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  . Hallux valgus (acquired), right foot 01/24/2018  . Hallux rigidus of right foot 01/24/2018  . Metatarsalgia of right foot 01/24/2018  . Moderate episode  of recurrent major depressive disorder ([place]) 11/04/2017  . History of cervical cancer 11/01/2017    History reviewed. No pertinent surgical history.  Prior to Admission medications   Medication Sig Start Date End Date Taking? Authorizing Provider  acetaminophen (TYLENOL) 500 MG tablet Take 1 tablet (500 mg total) by mouth every 6 (six) hours as needed. 02/06/19   Law, Bea Graff, PA-C  albuterol (VENTOLIN HFA) 108 (90 Base) MCG/ACT inhaler Inhale 2 puffs into the lungs every 6 (six) hours as needed for wheezing or shortness of breath. 10/28/20   Lloyd Huger, MD  folic acid (FOLVITE) 1 MG tablet Take 1 mg by mouth daily.    [provider]  ibuprofen (ADVIL) 600 MG tablet Take 1 tablet (600 mg total) by mouth every 6 (six) hours as needed. 02/06/19   Frederica Kuster, PA-C    Allergies Patient has no known allergies.  No family history on file.  Social History Social History   Tobacco Use  . Smoking status: Never Smoker  . Smokeless tobacco: Never Used  Substance Use Topics  . Alcohol use: Never      Review of Systems Constitutional: No fever/chills Eyes: No visual changes. ENT: No sore throat. Cardiovascular: No chest pain Respiratory: Positive for SOB Gastrointestinal: No abdominal pain.  No nausea, no vomiting.  No diarrhea.  No constipation. Genitourinary: Negative for dysuria. Musculoskeletal: Negative for back pain. Skin:  Negative for rash. Neurological: Negative for headaches, focal weakness or numbness. All other ROS negative ____________________________________________   PHYSICAL EXAM:  VITAL SIGNS: ED Triage Vitals [11/09/20 1108]  Enc Vitals Group     BP 106/67     Pulse Rate (!) 101     Resp (!) 28     Temp 98 F (36.7 C)     Temp Source Oral     SpO2 97 %     Weight 214 lb (97.1 kg)     Height      Head Circumference      Peak Flow      Pain Score      Pain Loc      Pain Edu?      Excl. in [place]?     Constitutional: Alert and  oriented. Well appearing and in no acute distress. Eyes: Conjunctivae are normal. EOMI. Head: Atraumatic. Nose: No congestion/rhinnorhea. Mouth/Throat: Mucous membranes are moist.   Neck: No stridor. Trachea Midline. FROM Cardiovascular: Normal rate, regular rhythm. Grossly normal heart sounds.  Good peripheral circulation. Respiratory: no stridor. Crackles base R Gastrointestinal: Soft and nontender. No distention. No abdominal bruits.  Musculoskeletal: No lower extremity tenderness nor edema.  No joint effusions. Neurologic:  Normal speech and language. No gross focal neurologic deficits are appreciated.  Skin:  Skin is warm, dry and intact. No rash noted. Psychiatric: Mood and affect are normal. Speech and behavior are normal. GU: Deferred   ____________________________________________   LABS (all labs ordered are listed, but only abnormal results are displayed)  Labs Reviewed  BASIC METABOLIC PANEL - Abnormal; Notable for the following components:      Result Value   Glucose, Bld 120 (*)    All other components within normal limits  CBC  POC URINE PREG, ED   ____________________________________________   ED ECG REPORT I, Vanessa Kingsbury, the attending physician, personally viewed and interpreted this ECG.  Sinus tachy, no st elevation, no twi, normal intervals. ____________________________________________  [place], personally viewed and evaluated these images (plain radiographs) as part of my medical decision making, as well as reviewing the written report by the radiologist.  ED MD interpretation:  Effusion on R  Official radiology report(s): DG Chest 2 View  Result Date: 11/09/2020 CLINICAL DATA:  Shortness of breath.  History of lung cancer EXAM: CHEST - 2 VIEW COMPARISON:  October 23, 2020, November 04, 2020 FINDINGS: The cardiomediastinal silhouette is unchanged in contour with unchanged irregular RIGHT perihilar contours and opacity of the  anterior mediastinum.There is a moderate RIGHT pleural effusion, increased in comparison to prior. Trace LEFT pleural effusion. No pneumothorax. Homogeneous opacification of the RIGHT lung base, likely atelectasis. Status post cholecystectomy. Mild degenerative changes of the thoracic spine. IMPRESSION: 1. Interval increase in size of a moderate RIGHT pleural effusion. 2. Revisualization of malignancy involving the anterior mediastinum and extending along the RIGHT perihilar border. Electronically Signed   By: Valentino Saxon MD   On: 11/09/2020 11:50    ____________________________________________   PROCEDURES  Procedure(s) performed (including Critical Care):  .Critical Care Performed by: Vanessa [place], MD Authorized by: Vanessa Misenheimer, MD   Critical care provider statement:    Critical care time (minutes):  35   Critical care was necessary to treat or prevent imminent or life-threatening deterioration of the following conditions:  Respiratory failure   Critical care was time spent personally by me on the following activities:  Discussions with consultants, evaluation of patient's  response to treatment, examination of patient, ordering and performing treatments and interventions, ordering and review of laboratory studies, ordering and review of radiographic studies, pulse oximetry, re-evaluation of patient's condition, obtaining history from patient or surrogate and review of old charts .1-3 Lead EKG Interpretation Performed by: Vanessa [place], MD Authorized by: Vanessa [place], MD     Interpretation: normal     ECG rate:  90s   ECG rate assessment: normal     Rhythm: sinus rhythm     Ectopy: none     Conduction: normal   Comments:     Occasional tachycardia and normal sinus     ____________________________________________   INITIAL IMPRESSION / ASSESSMENT AND PLAN / ED COURSE   Jennifer Cole was evaluated in Emergency Department on 11/09/2020 for the symptoms described in  the history of present illness. She was evaluated in the context of the global COVID-19 pandemic, which necessitated consideration that the patient might be at risk for infection with the SARS-CoV-2 virus that causes COVID-19. Institutional protocols and algorithms that pertain to the evaluation of patients at risk for COVID-19 are in a state of rapid change based on information released by regulatory bodies including the CDC and federal and state organizations. These policies and algorithms were followed during the patient's care in the ED.     Pt presents with SOB. Most likely from pleural effusion PNA-will get xray to evaluation Anemia-CBC to evaluate ACS-no chest pain low suspicion Arrhythmia-Will get EKG and keep on monitor.  COVID- will get testing per algorithm. PE-lower suspicion given no risk factors and other cause more likely   Discussed with Dr. Pascal Lux from IR  who stated that if we do thoracentesis today there will be harder to place the indwelling  on Monday.  He recommended either admitting her for observation that if she decompensates they can do it more emergently versus letting her go home and following up on Monday for catheter but given she is not hypoxic at this time would prefer to try to hold off on thoracentesis to facilitate catheter placement on Monday  I went to go talk to patient about possible admission versus going home and patient's oxygen level was 90 to 92% given her borderline oxygen level I encouraged her to stay and be admitted for IR to potentially do repeat Thora if she decompensates further versus hopefully wait till Monday for the catheter.  Placed pt on 2L oxygen.      ____________________________________________   FINAL CLINICAL IMPRESSION(S) / ED DIAGNOSES   Final diagnoses:  Pleural effusion  Acute respiratory failure with hypoxia (HCC)     MEDICATIONS GIVEN DURING THIS VISIT:  Medications  albuterol (VENTOLIN HFA) 108 (90 Base) MCG/ACT  inhaler 2 puff (has no administration in time range)  acetaminophen (TYLENOL) tablet 325 mg (has no administration in time range)    Or  acetaminophen (TYLENOL) suppository 325 mg (has no administration in time range)  ondansetron (ZOFRAN) tablet 4 mg (has no administration in time range)    Or  ondansetron (ZOFRAN) injection 4 mg (has no administration in time range)  enoxaparin (LOVENOX) injection 47.5 mg (has no administration in time range)     ED Discharge Orders    None       Note:  This document was prepared using Dragon voice recognition software and may include unintentional dictation errors.   Vanessa [place], MD 11/09/20 [phone]

## 2020-11-09 NOTE — Progress Notes (Signed)
Pt arrived from ED for continued medical treatment at this time to 1C room 129

## 2020-11-09 NOTE — Plan of Care (Signed)
  Problem: Education: Goal: Knowledge of General Education information will improve Description: Including pain rating scale, medication(s)/side effects and non-pharmacologic comfort measures Outcome: Progressing   Problem: Health Behavior/Discharge Planning: Goal: Ability to manage health-related needs will improve Outcome: Progressing   Problem: Clinical Measurements: Goal: Ability to maintain clinical measurements within normal limits will improve Outcome: Progressing Goal: Will remain free from infection Outcome: Progressing Goal: Diagnostic test results will improve Outcome: Progressing Goal: Respiratory complications will improve Outcome: Progressing Goal: Cardiovascular complication will be avoided Outcome: Progressing   Problem: Elimination: Goal: Will not experience complications related to bowel motility Outcome: Progressing Goal: Will not experience complications related to urinary retention Outcome: Progressing   Problem: Coping: Goal: Level of anxiety will decrease Outcome: Progressing   Problem: Pain Managment: Goal: General experience of comfort will improve Outcome: Progressing   Problem: Elimination: Goal: Will not experience complications related to bowel motility Outcome: Progressing Goal: Will not experience complications related to urinary retention Outcome: Progressing   Problem: Safety: Goal: Ability to remain free from injury will improve Outcome: Progressing   Problem: Skin Integrity: Goal: Risk for impaired skin integrity will decrease Outcome: Progressing

## 2020-11-09 NOTE — ED Notes (Signed)
Pt placed on 2L O2 for comfort.

## 2020-11-09 NOTE — Progress Notes (Signed)
[place]  [phone] [phone]  ID: Jennifer Cole OB: 1965-12-09  MR#: [id number]  YQM#:[id number]  Patient Care Team: Patient, [name] as PCP - General ([place]) Telford Nab, RN as Oncology Nurse Navigator  CHIEF COMPLAINT: Primary mediastinal B-cell lymphoma  INTERVAL HISTORY: Patient returns to clinic today to discuss her pathology results and treatment planning.  Her shortness of breath is mildly improved since placement of Pleurx catheter, but she continues to have significant right flank pain at the site of her catheter.  She has increased weakness and fatigue.  She continues to have a chronic cough as well.  She has no neurologic complaints.  She denies any recent fevers.  She has a fair appetite, but denies weight loss.  She has no chest pain or hemoptysis.  She denies any nausea, vomiting, constipation, or diarrhea.  She has no urinary complaints.  Patient offers no further specific complaints today.  REVIEW OF SYSTEMS:   Review of Systems  Constitutional: Positive for malaise/fatigue. Negative for fever and weight loss.  Respiratory: Positive for cough and shortness of breath. Negative for hemoptysis.   Cardiovascular: Negative.  Negative for chest pain and leg swelling.  Gastrointestinal: Negative.  Negative for abdominal pain.  Genitourinary: Negative.  Negative for dysuria.  Musculoskeletal: Negative.  Negative for back pain.  Skin: Negative.   Neurological: Positive for weakness. Negative for dizziness, focal weakness and headaches.  Psychiatric/Behavioral: Negative.  The patient is not nervous/anxious.     As per HPI. Otherwise, a complete review of systems is negative.  PAST MEDICAL HISTORY: Past Medical History:  Diagnosis Date  . Cancer ([place])    cervical    PAST SURGICAL HISTORY: Past Surgical History:  Procedure Laterality Date  . IR PERC PLEURAL DRAIN W/INDWELL CATH W/IMG GUIDE  11/11/2020     FAMILY HISTORY: History reviewed. No pertinent family history.  ADVANCED DIRECTIVES (Y/N):  N  HEALTH MAINTENANCE: Social History   Tobacco Use  . Smoking status: Never Smoker  . Smokeless tobacco: Never Used  Substance Use Topics  . Alcohol use: Never     Colonoscopy:  PAP:  Bone density:  Lipid panel:  No Known Allergies  Current Outpatient Medications  Medication Sig Dispense Refill  . acetaminophen (TYLENOL) 500 MG tablet Take 1 tablet (500 mg total) by mouth every 6 (six) hours as needed. 30 tablet 0  . albuterol (VENTOLIN HFA) 108 (90 Base) MCG/ACT inhaler Inhale 2 puffs into the lungs every 6 (six) hours as needed for wheezing or shortness of breath. 8 g 2  . folic acid (FOLVITE) 1 MG tablet Take 1 mg by mouth daily.    Marland Kitchen HYDROcodone-acetaminophen (NORCO/VICODIN) 5-325 MG tablet Take 1 tablet by mouth every 6 (six) hours as needed for up to 5 days for moderate pain or severe pain. 20 tablet 0  . ibuprofen (ADVIL) 600 MG tablet Take 1 tablet (600 mg total) by mouth every 6 (six) hours as needed. 30 tablet 0  . allopurinol (ZYLOPRIM) 300 MG tablet Take 1 tablet (300 mg total) by mouth daily. 30 tablet 3  . lidocaine-prilocaine (EMLA) cream Apply to affected area once 30 g 3  . ondansetron (ZOFRAN) 8 MG tablet Take 1 tablet (8 mg total) by mouth 2 (two) times daily as needed for refractory nausea / vomiting. 60 tablet 2  . predniSONE (DELTASONE) 20 MG tablet Take 5 tablets (100 mg total) by mouth daily. Take with food on days 1-5 of chemotherapy. 25 tablet  5  . prochlorperazine (COMPAZINE) 10 MG tablet Take 1 tablet (10 mg total) by mouth every 6 (six) hours as needed (Nausea or vomiting). 60 tablet 2   No current facility-administered medications for this visit.    OBJECTIVE: Vitals:   11/12/20 1111  BP: 97/71  Pulse: 96  Resp: 20  Temp: 99 F (37.2 C)  SpO2: 97%     Body mass index is 35.15 kg/m.    ECOG FS:1 - Symptomatic but completely  ambulatory  General: Well-developed, well-nourished, no acute distress. Eyes: Pink conjunctiva, anicteric sclera. HEENT: Normocephalic, moist mucous membranes. Lungs: No audible wheezing or coughing. Heart: Regular rate and rhythm. Abdomen: Soft, nontender, no obvious distention. Musculoskeletal: No edema, cyanosis, or clubbing.  Pleurx catheter in place in right flank.  No erythema or drainage. Neuro: Alert, answering all questions appropriately. Cranial nerves grossly intact. Skin: No rashes or petechiae noted. Psych: Normal affect.   LAB RESULTS:  Lab Results  Component Value Date   NA 137 11/10/2020   K 3.8 11/10/2020   CL 103 11/10/2020   CO2 24 11/10/2020   GLUCOSE 99 11/10/2020   BUN 16 11/10/2020   CREATININE 0.63 11/10/2020   CALCIUM 9.3 11/10/2020   GFRNONAA >60 11/10/2020    Lab Results  Component Value Date   WBC 4.9 11/10/2020   NEUTROABS 4.5 10/23/2020   HGB 12.2 11/10/2020   HCT 38.2 11/10/2020   MCV 90.1 11/10/2020   PLT 337 11/10/2020     STUDIES: DG Chest 2 View  Result Date: 11/09/2020 CLINICAL DATA:  Shortness of breath.  History of lung cancer EXAM: CHEST - 2 VIEW COMPARISON:  October 23, 2020, November 04, 2020 FINDINGS: The cardiomediastinal silhouette is unchanged in contour with unchanged irregular RIGHT perihilar contours and opacity of the anterior mediastinum.There is a moderate RIGHT pleural effusion, increased in comparison to prior. Trace LEFT pleural effusion. No pneumothorax. Homogeneous opacification of the RIGHT lung base, likely atelectasis. Status post cholecystectomy. Mild degenerative changes of the thoracic spine. IMPRESSION: 1. Interval increase in size of a moderate RIGHT pleural effusion. 2. Revisualization of malignancy involving the anterior mediastinum and extending along the RIGHT perihilar border. Electronically Signed   By: Valentino Saxon MD   On: 11/09/2020 11:50   DG Chest 2 View  Result Date: 10/28/2020 CLINICAL  DATA:  Shortness of breath EXAM: CHEST - 2 VIEW COMPARISON:  Chest radiograph and chest CT October 23, 2020 FINDINGS: Right pleural effusion, increased in size. Airspace opacity/consolidation with obstruction of the right lower lobe bronchus; CT shows large mass in this area. Left lung is clear. Heart is upper normal in size. Pulmonary vascularity on the left is normal. Pulmonary vascularity on the right appears grossly normal, although there is soft tissue fullness in this area due to adenopathy which is evident on recent CT. No appreciable bone lesions. IMPRESSION: Pleural effusion on the right has increased in size. Extensive mass with consolidation throughout portions of the right middle lobe region with mass arising from the right mediastinum and potentially pericardium. There is right hilar adenopathy, better delineated by CT. Left lung is clear. Stable cardiac silhouette. Electronically Signed   By: Lowella Grip III M.D.   On: 10/28/2020 11:31   DG Chest 2 View  Result Date: 10/23/2020 CLINICAL DATA:  55 year old female with increasing shortness of breath for 2 days. EXAM: CHEST - 2 VIEW COMPARISON:  None. FINDINGS: Moderate size layering right pleural effusion with dense right lung base opacification. No superimposed pulmonary edema  or pneumothorax. The left lung appears to remain clear. Possible cardiomegaly. Right hilum and right heart border are obscured. Furthermore, possible tapered appearance of the right bronchus intermedius. Visualized tracheal air column is within normal limits. No acute osseous abnormality identified. Negative visible bowel gas pattern. Surgical clips in the upper abdomen. IMPRESSION: 1. Moderate size right pleural effusion. Underlying mass or pneumonia not excluded. 2. Questionable cardiomegaly.  No overt edema. Electronically Signed   By: Genevie Ann M.D.   On: 10/23/2020 08:51   CT Angio Chest PE W/Cm &/Or Wo Cm  Result Date: 10/23/2020 CLINICAL DATA:  55 year old female  with increasing shortness of breath and abnormal chest x-ray this morning. EXAM: CT ANGIOGRAPHY CHEST WITH CONTRAST TECHNIQUE: Multidetector CT imaging of the chest was performed using the standard protocol during bolus administration of intravenous contrast. Multiplanar CT image reconstructions and MIPs were obtained to evaluate the vascular anatomy. CONTRAST:  9m OMNIPAQUE IOHEXOL 350 MG/ML SOLN COMPARISON:  Chest radiographs 10/23/2020. FINDINGS: Cardiovascular: Adequate contrast bolus timing in the pulmonary arterial tree. Respiratory motion degrades detail of the distal left lower lobe pulmonary arteries. Elsewhere no focal filling defect identified in the pulmonary arteries to suggest acute pulmonary embolism. However, there is a large mediastinal mass which severely narrows the right middle and upper lobe pulmonary artery branches (series 6, image 121). The mass infiltrates the superior mediastinum and is also inseparable from the ascending aorta, some of the proximal great vessels, cardiac base and right heart border. No pericardial effusion. No cardiomegaly. Aorta and proximal great vessels remain patent without irregularity. The mass also severely attenuates the right pulmonary veins. Mediastinum/Nodes: Large, infiltrative mediastinal mass occupying the superior and anterior mediastinum tracking inferiorly on the right. The mass encompasses 9.9 x 14.1 x 14.2 cm (AP by transverse by CC). See series 5, image 51 and coronal series 10, image 30. Slightly Discontinuous ex-nodal disease is noted about the disease great vessels. There is no lymphadenopathy in the visible lower neck or in the axilla. Associated regional mass effect, including on the right airways, see below. Lungs/Pleura: Moderate to large mixed subpleural and layering right pleural effusion with simple fluid density. Compressive atelectasis of virtually the entire right lower lobe. Atelectasis of the right middle lobe is related both to pleural  fluid and the large mediastinal mass. There is irregular narrowing of the bronchus intermedius (series 7, image 42) and the other right hilar airways are also encased by tumor. Major airways do remain patent. In the left lung 2 indeterminate although somewhat sub solid appearing pulmonary nodules are identified in the peripheral upper lobe (series 7, image 28, 4-5 mm) and lower lobe just above the diaphragm (6-7 mm image 71). Trace left pleural fluid. Upper Abdomen: Absent gallbladder. Visible liver, spleen, pancreas, adrenal glands, kidneys and bowel remain within normal limits. Musculoskeletal: No sternal or rib destruction related to the mass. No acute or suspicious osseous lesion is identified. Review of the MIP images confirms the above findings. IMPRESSION: 1. 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 Discontinuous ex-nodal disease in the superior prevascular space. Top differential considerations include small cell carcinoma, lymphoma, and non-small cell bronchogenic carcinoma. 2. Superimposed moderate to large layering and sub-pulmonic right pleural effusion associated right lung atelectasis. 3. Two small indeterminate left lung pulmonary nodules. 4. Negative for acute pulmonary embolus. No metastatic disease identified in the upper abdomen. Electronically Signed   By:  Genevie Ann M.D.   On: 10/23/2020 11:30   MR Brain W Wo Contrast  Result Date: 10/30/2020 CLINICAL DATA:  Staging of recently diagnosed lung cancer. EXAM: MRI HEAD WITHOUT AND WITH CONTRAST TECHNIQUE: Multiplanar, multiecho pulse sequences of the brain and surrounding structures were obtained without and with intravenous contrast. CONTRAST:  95m GADAVIST GADOBUTROL 1 MMOL/ML IV SOLN COMPARISON:  None. FINDINGS: Brain: There is no evidence of an acute infarct, intracranial hemorrhage, mass, midline shift,  or extra-axial fluid collection. The ventricles and sulci are normal. A few punctate foci of T2 hyperintensity in the cerebral white matter are nonspecific and considered to be within normal limits for age. No abnormal enhancement is identified. Vascular: Major intracranial vascular flow voids are preserved. Skull and upper cervical spine: Unremarkable bone marrow signal. Sinuses/Orbits: Unremarkable orbits. Clear paranasal sinuses. Small bilateral mastoid effusions. Other: None. IMPRESSION: Unremarkable appearance of the brain for age. No evidence of intracranial metastases. Electronically Signed   By: ALogan BoresM.D.   On: 10/30/2020 08:17   NM PET Image Initial (PI) Skull Base To Thigh  Result Date: 10/30/2020 CLINICAL DATA:  Initial treatment strategy for mediastinal mass. EXAM: NUCLEAR MEDICINE PET SKULL BASE TO THIGH TECHNIQUE: 11.4 mCi F-18 FDG was injected intravenously. Full-ring PET imaging was performed from the skull base to thigh after the radiotracer. CT data was obtained and used for attenuation correction and anatomic localization. Fasting blood glucose: 72 mg/dl COMPARISON:  CT scan 10/30/2020 FINDINGS: Mediastinal blood pool activity: SUV max 1.97 Liver activity: SUV max NA NECK: No neck mass or adenopathy. Incidental CT findings: none CHEST: Hypermetabolic soft tissue mass noted in the left upper chest wall area with ill-defined soft tissue density which could be a nodal mass measures a maximum of 14 mm and the SUV max is 19.07. Small subclavicular lymph node measures 8 mm on image 63/3 and has an SUV max of 3.37. No breast masses, axillary or subpectoral adenopathy. Large anterior mediastinal mass also invading the middle mediastinum on the right side. The lesion is markedly hypermetabolic with SUV max of 388.50 There is an area of chest wall invasion along the left side of the sternum. There is also a separate pleural nodule on the left side measuring 10 mm with an SUV max of 4.20. A small  pleural nodule is also noted posterior to the descending thoracic aorta on image number 74/3. It measures 10 mm and the SUV max is 6.21. Moderate-sized right pleural effusion is noted but no definite hypermetabolic right-sided pleural nodules. A few small indeterminate pulmonary nodules are also noted. Incidental CT findings: none ABDOMEN/PELVIS: No hypermetabolic solid abdominal organ lesions and no hypermetabolic lymphadenopathy. No inguinal adenopathy. Incidental CT findings: none SKELETON: No osseous lesions are identified. Incidental CT findings: none IMPRESSION: 1. Large markedly hypermetabolic anterior mediastinal mass also involving the middle mediastinum on the right side. Findings suspicious for thymic neoplasm. Lymphoma and germ cell tumors would be other possibilities. The lesion appears to invade the left chest wall along the lateral margin of the sternum. 2. Associated small hypermetabolic pleural nodules on the left side. 3. 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 4. A few small indeterminate pulmonary nodules are noted. 5. Hypermetabolic soft tissue mass in the left chest wall anterior to the left first rib could represent adenopathy. 6. No findings for abdominal/pelvic metastatic disease or osseous metastatic disease. Electronically Signed   By: PMarijo SanesM.D.   On: 10/30/2020 17:06   CT BIOPSY  Result Date: 11/04/2020 INDICATION:  55 year old with a large mediastinal mass. Tissue diagnosis is needed. EXAM: CT-GUIDED CORE BIOPSY OF MEDIASTINAL MASS MEDICATIONS: None. ANESTHESIA/SEDATION: Moderate (conscious) sedation was employed during this procedure. A total of Versed 2.0 mg and Fentanyl 100 mcg was administered intravenously. Moderate Sedation Time: 16 minutes. The patient's level of consciousness and vital signs were monitored continuously by radiology nursing throughout the procedure under my direct supervision. FLUOROSCOPY TIME:  Fluoroscopy Time: None COMPLICATIONS: None immediate.  PROCEDURE: Informed written consent was obtained from the patient after a thorough discussion of the procedural risks, benefits and alternatives. All questions were addressed. A timeout was performed prior to the initiation of the procedure. Patient was placed supine on the CT scanner. CT images through the chest were obtained. The anterior chest was prepped with chlorhexidine and sterile field was created. Skin and soft tissues were anesthetized with 1% lidocaine. Using CT guidance, a 17 gauge coaxial needle was directed into the anterior mediastinal mass along the left side of the sternum. Needle was directed into the mediastinal mass and needle position was confirmed with CT. Three core biopsies were obtained with an 18 gauge core device. Specimens were placed on Telfa pad with saline. Needle was removed without complication and follow up CT images were obtained. Bandage placed over the puncture site. FINDINGS: Again noted is a large anterior mediastinal mass with low-density along the right side of the mass. Biopsy needle was directed into the mass along the left side of the sternum. Needle position confirmed within the mediastinal mass. No significant bleeding or hematoma formation at the end of the procedure. Small loculated right pleural effusion. Tiny focus of gas within the right pleural effusion related to recent thoracentesis. No significant pleural gas. Again noted is a small nodule in the left upper lobe measuring close to 6 mm. IMPRESSION: CT-guided core biopsy of the anterior mediastinal mass. Electronically Signed   By: Markus Daft M.D.   On: 11/04/2020 15:34   DG Chest Port 1 View  Result Date: 11/04/2020 CLINICAL DATA:  Status post thoracentesis EXAM: PORTABLE CHEST 1 VIEW COMPARISON:  October 30, 2020. FINDINGS: No pneumothorax evident. Small right pleural effusion evident. There is mild right base atelectasis. The lungs elsewhere are clear. There is stable cardiomegaly with pulmonary  vascularity normal. No adenopathy. No bone lesions. IMPRESSION: No appreciable pneumothorax. Small right pleural effusion with right base atelectasis. No consolidation. Stable cardiomegaly. Electronically Signed   By: Lowella Grip III M.D.   On: 11/04/2020 15:15   DG Chest Port 1 View  Result Date: 10/30/2020 CLINICAL DATA:  Status post thoracentesis. EXAM: PORTABLE CHEST 1 VIEW COMPARISON:  10/28/2020 FINDINGS: Interval near complete evacuation of the right pleural fluid collection. No postprocedural pneumothorax is identified. Streaky right basilar atelectasis. IMPRESSION: Near complete evacuation of right pleural fluid collection without postprocedural pneumothorax. Electronically Signed   By: Marijo Sanes M.D.   On: 10/30/2020 13:04   DG Chest Port 1 View  Result Date: 10/23/2020 CLINICAL DATA:  Post thoracentesis EXAM: PORTABLE CHEST 1 VIEW COMPARISON:  CT 10/23/2020 FINDINGS: Interval decrease in the size of the right pleural effusion with some small to moderate volume residual layering fluid in the right lung base. Suspect at least trace left effusion. Residual atelectatic changes are noted in both lungs. Redemonstration of the irregular heterogeneous mediastinal contours compatible with the infiltrative mediastinal mass seen on comparison CT imaging. Previously seen pulmonary nodules are less well visualized radiographically. No visible pneumothorax. No acute osseous or soft tissue abnormality. Telemetry leads overlie  the chest. IMPRESSION: 1. Interval decrease in the size of the right pleural effusion with some small to moderate volume residual layering fluid in the right lung base. No pneumothorax. Suspect at least trace left effusion. 2. Irregular mediastinal margins compatible with the infiltrative mass seen on comparison CT. Electronically Signed   By: Lovena Le M.D.   On: 10/23/2020 16:11   IR PERC PLEURAL DRAIN W/INDWELL CATH W/IMG GUIDE  Result Date: 11/11/2020 CLINICAL DATA:   54 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 EXAM: INSERTION OF TUNNELED right SIDED PLEURAL DRAINAGE CATHETER COMPARISON:  11/09/2020 MEDICATIONS: Ancef 2 gm IV; Antibiotic was administered in an appropriate time interval for the procedure. ANESTHESIA/SEDATION: Moderate (conscious) sedation was employed during this procedure. A total of Versed 3 mg and Fentanyl 150 mcg was administered intravenously. Moderate Sedation Time: 34 minutes. The patient's level of consciousness and vital signs were monitored continuously by radiology nursing throughout the procedure under my direct supervision. FLUOROSCOPY TIME:  FLUOROSCOPY TIME 0.4 min (8.6 mGy) COMPLICATIONS: None immediate. PROCEDURE: The procedure, risks, benefits, and alternatives were explained to the patient, who wishes to proceed with the placement of this permanent pleural catheter as they are seeking palliative care. The patient understands and consents to the procedure. The right lateral chest and upper abdomen were prepped and draped in a sterile fashion, and a sterile drape was applied covering the operative field. A sterile gown and sterile gloves were used for the procedure. Initial ultrasound scanning and fluoroscopic imaging demonstrates a recurrent moderate to large pleural effusion. Under direct ultrasound guidance, the inferior lateral pleural space was accessed with a Yueh sheath needle after the overlying soft tissues were anesthetized with 1% lidocaine with epinephrine. A Rosen wire was then advanced under fluoroscopy into the pleural space. A 15.5 French tunneled Pleur-X catheter was tunneled from an incision in the right upper abdominal quadrant to the access site. The pleural access site was serially dilated under fluoroscopy, ultimately allowing placement of a peel-away sheath. The catheter was advanced through the peel-away sheath. The sheath was then removed. Final catheter positioning was confirmed with a fluoroscopic  radiographic image. The access incision was closed with Dermabond. A 0 silk retention suture was applied at the catheter exit site. Thoracentesis was performed through the new catheter utilizing provided bulb vacuum assisted drainage bag. Dressings were applied. The patient tolerated the above procedure well without immediate postprocedural complication. FINDINGS: Preprocedural ultrasound scanning demonstrates a recurrent moderate sized right sided pleural effusion. After ultrasound and fluoroscopic guided placement, the catheter is directed superiorly Following catheter placement, approximately 50 cc of translucent, straw-colored pleural fluid was removed. IMPRESSION: Successful placement of permanent, tunneled right pleural drainage catheter via lateral approach. 50 mL of translucent, straw-colored pleural fluid was removed following catheter placement. Ruthann Cancer, MD [facility] Electronically Signed   By: Ruthann Cancer MD   On: 11/11/2020 15:00   US THORACENTESIS ASP PLEURAL SPACE W/IMG GUIDE  Result Date: 11/04/2020 INDICATION: Mediastinal mass. Shortness of breath. Recurrent right pleural effusion. Request for therapeutic thoracentesis. EXAM: ULTRASOUND GUIDED RIGHT THORACENTESIS MEDICATIONS: 1% plain lidocaine, 10 mL COMPLICATIONS: None immediate. PROCEDURE: An ultrasound guided thoracentesis was thoroughly discussed with the patient and questions answered. The benefits, risks, alternatives and complications were also discussed. The patient understands and wishes to proceed with the procedure. Written consent was obtained. Ultrasound was performed to localize and mark an adequate pocket of fluid in the right chest. The area was then prepped and draped in  the normal sterile fashion. 1% Lidocaine was used for local anesthesia. Under ultrasound guidance a 6 Fr Safe-T-Centesis catheter was introduced. Thoracentesis was performed. The catheter was  removed and a dressing applied. FINDINGS: A total of approximately 900 mL of clear yellow fluid was removed. Ultrasound of the right chest demonstrates right pleural effusion with interval development of loculations. IMPRESSION: Successful ultrasound guided right thoracentesis yielding 900 mL of pleural fluid. Read by: Ascencion Dike PA-C Electronically Signed   By: Markus Daft M.D.   On: 11/04/2020 12:52   US THORACENTESIS ASP PLEURAL SPACE W/IMG GUIDE  Result Date: 10/30/2020 INDICATION: 55 year old with mediastinal mass and recurrent right pleural effusion. EXAM: ULTRASOUND GUIDED RIGHT THORACENTESIS MEDICATIONS: None. COMPLICATIONS: None immediate. PROCEDURE: An ultrasound guided thoracentesis was thoroughly discussed with the patient and questions answered. The benefits, risks, alternatives and complications were also discussed. The patient understands and wishes to proceed with the procedure. Written consent was obtained. Ultrasound was performed to localize and mark an adequate pocket of fluid in the right chest. The area was then prepped and draped in the normal sterile fashion. 1% Lidocaine was used for local anesthesia. Under ultrasound guidance a 6 Fr Safe-T-Centesis catheter was introduced. Thoracentesis was performed. The catheter was removed and a dressing applied. FINDINGS: A total of approximately 1.3 L of yellow fluid was removed. IMPRESSION: Successful ultrasound guided right thoracentesis yielding 1.3 L of pleural fluid. Electronically Signed   By: Markus Daft M.D.   On: 10/30/2020 13:29   US THORACENTESIS ASP PLEURAL SPACE W/IMG GUIDE  Result Date: 10/23/2020 INDICATION: RIGHT PLEURAL EFFUSION EXAM: ULTRASOUND GUIDED RIGHT THORACENTESIS MEDICATIONS: 1% LIDOCAINE LOCAL COMPLICATIONS: None immediate. PROCEDURE: An ultrasound guided thoracentesis was thoroughly discussed with the patient and questions answered. The benefits, risks, alternatives and complications were also discussed. The patient  understands and wishes to proceed with the procedure. Written consent was obtained. Ultrasound was performed to localize and mark an adequate pocket of fluid in the right chest. The area was then prepped and draped in the normal sterile fashion. 1% Lidocaine was used for local anesthesia. Under ultrasound guidance a 6 Fr Safe-T-Centesis catheter was introduced. Thoracentesis was performed. The catheter was removed and a dressing applied. FINDINGS: A total of approximately 1.1 L of CLEAR PLEURAL fluid was removed. Samples were sent to the laboratory as requested by the clinical team. IMPRESSION: Successful ultrasound guided right thoracentesis yielding 1.1 L of pleural fluid. Electronically Signed   By: Jerilynn Mages.  Shick M.D.   On: 10/23/2020 15:59    ASSESSMENT: Primary mediastinal B-cell lymphoma   PLAN:    1. Primary mediastinal B-cell lymphoma: Imaging and pathology reviewed independently and also discussed with radiology and pathologist.  Patient will require bone marrow biopsy to complete staging work-up.  She will also require cardiac echo and port placement prior to initiating treatment.  Patient will receive 6 cycles of R-CHOP with Neulasta support every 3 weeks followed by consolidation XRT.  Return to clinic on Wednesday, November 20, 2020 to initiate cycle 1 of 6. 2.  Shortness of breath: Secondary to pleural effusion from lymphoma.  Patient has Pleurx catheter in place. 3.  Pain: Patient states she has a prescription at the pharmacy that she needs to pick up later today.  She has been instructed to call clinic if she needs refills or medication is not adequate. 4.  Headache: Patient does not complain of this today.  MRI of the brain on October 30, 2020 did not report any significant pathology.  I  spent a total of 30 minutes reviewing chart data, face-to-face evaluation with the patient, counseling and coordination of care as detailed above.   Patient expressed understanding and was in agreement with  this plan. She also understands that She can call clinic at any time with any questions, concerns, or complaints.   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

## 2020-11-09 NOTE — Progress Notes (Signed)
PHARMACIST - PHYSICIAN COMMUNICATION  CONCERNING:  Enoxaparin (Lovenox) for DVT Prophylaxis    RECOMMENDATION: Patient was prescribed enoxaprin 40mg  q24 hours for VTE prophylaxis.   Filed Weights   11/09/20 1108  Weight: 97.1 kg (214 lb)    Body mass index is 34.54 kg/m.  Estimated Creatinine Clearance: 95.5 mL/min (by C-G formula based on SCr of 0.72 mg/dL).   Based on [place] patient is candidate for enoxaparin 0.5mg /kg TBW SQ every 24 hours based on BMI being >30.  DESCRIPTION: Pharmacy has adjusted enoxaparin dose per [facility] policy.  Patient is now receiving enoxaparin 47.5 mg every 24 hours   Benn Moulder, PharmD Pharmacy Resident  11/09/2020 3:22 PM

## 2020-11-09 NOTE — H&P (Signed)
History and Physical   Christna Kulick ZJQ:[id number] DOB: May 28, 1966 DOA: 11/09/2020  PCP: Patient, [name]  Outpatient Specialists: Dr. Grayland Ormond, medical oncology  patient coming from: Home  I have personally briefly reviewed patient's old medical records in [place].  Chief Concern: Shortness of breath  HPI: Jennifer Cole is a 55 y.o. female with no previous significant medical history and was not prescribed any chronic medications in the previously, recent new diagnosis of right lung mass on 10/23/2020, recurrent right pleural effusion, presented to the emergency department for chief concerns of shortness of breath.  She reports persistent shortness of breath since 10/23/20.  She states the shortness of breath improves with thoracentesis.  She reports this new acute on chronic shortness of breath developed two days ago, 11/07/2020, and she was trying to hold off coming until her procedure appointment on Monday, 11/11/2020.  However she was at work and she was having difficulty performing her work due to the shortness of breath and her coworkers called EMS to take her to the emergency department.  She denies fever, chest pain, abdominal pain, dysuria, nausea, vomiting, headache, vision changes, hematuria, diarrhea.  She reports her last BM was morning of admission, and it was normal color and consistency.  She denies blood.  She states that she last had a colonoscopy a couple of years ago and was told to come back in 10 years.  Appointment  Social history: lives with boyfriend. Patient denies history of smoking. She grew up in a household exposed to second hand tobacco, mother, mother's boyfriend, patient's sister. Patient denies etoh and recreatonal drug use. She works at [employer].   Note daughter passed away from unknown cancer in 2021, she was 54 years old.   Vaccination: two vaccinations, [place], 12/21/19 and 01/15/20  ROS: Constitutional: no weight change, no  fever ENT/Mouth: no sore throat, no rhinorrhea Eyes: no eye pain, no vision changes Cardiovascular: no chest pain, + dyspnea,  no edema, no palpitations Respiratory: + cough, no sputum, no wheezing Gastrointestinal: + nausea, no vomiting, no diarrhea, no constipation Genitourinary: no urinary incontinence, no dysuria, no hematuria Musculoskeletal: no arthralgias, no myalgias Skin: no skin lesions, no pruritus, Neuro: no weakness, no loss of consciousness, no syncope Psych: no anxiety, no depression, no decrease appetite Heme/Lymph: no bruising, no bleeding  ED Course: Discussed with ED provider, patient requiring hospitalization due to desaturation from 98% to 90-91% on room air with recurrent of right pleural effusion.  Vitals in the emergency department was afebrile with temperature of 98, respiration rate ranged from 18-28, heart rate of 93-1 01, blood pressure 101/67, 93 to 94% on 2 L nasal cannula.  Chest x-ray in the emergency department revealed right pleural effusion worse compared to 11/04/2020.  Assessment/Plan  Active Problems:   Recurrent right pleural effusion   Patient has elevated respiration rate and heart rate however I do not believe patient is septic at this time -SIRS criteria secondary to recurrent right pleural effusion  Right pleural effusion secondary to right lung mass-IR radiologist, Dr. Pascal Lux has been consulted by ED provider -Patient initially has outpatient appointment for IR Pleurx catheter for 11/11/2020 -As patient desaturated in the emergency department, remains tachycardic, tachypneic on room air, patient with benefit from observation for close monitoring as she has a recurrence of large right mid to lower lobe pleural effusion -Dr. Pascal Lux is aware and agrees with plan -Plan to hold thoracentesis pending Monday so that Pleurx catheter can be placed at the same  time -IR is not staffed inpatient over the weekend  Right lung mass diagnosed on  10/23/2020 -Status post CT-guided biopsy of the mediastinal mass on 11/04/2020  Chart reviewed.   DVT prophylaxis: Enoxaparin Code Status: Full code Diet: Regular Family Communication: No Disposition Plan: Pending clinical course, thoracentesis Consults called: IR, Dr. Pascal Lux via secure chat by EDP Admission status: MedSurg observation with telemetry  Past Medical History:  Diagnosis Date  . Cancer [facility])    cervical   History reviewed. No pertinent surgical history.  Social History:  reports that she has never smoked. She has never used smokeless tobacco. She reports that she does not drink alcohol. No history on file for drug use.  Family history: Family history reviewed and pertinent for daughter passing away of unknown carcinoma in 2021  Prior to Admission medications   Medication Sig Start Date End Date Taking? Authorizing Provider  acetaminophen (TYLENOL) 500 MG tablet Take 1 tablet (500 mg total) by mouth every 6 (six) hours as needed. 02/06/19   Law, Bea Graff, PA-C  albuterol (VENTOLIN HFA) 108 (90 Base) MCG/ACT inhaler Inhale 2 puffs into the lungs every 6 (six) hours as needed for wheezing or shortness of breath. 10/28/20   Lloyd Huger, MD  folic acid (FOLVITE) 1 MG tablet Take 1 mg by mouth daily.    [provider]  ibuprofen (ADVIL) 600 MG tablet Take 1 tablet (600 mg total) by mouth every 6 (six) hours as needed. 02/06/19   Frederica Kuster, PA-C   Physical Exam: Vitals:   11/09/20 1500 11/09/20 1530 11/09/20 1700 11/09/20 2006  BP: 111/77 102/62 118/76 117/79  Pulse: 96 94 99 98  Resp: 20 (!) 22 20 (!) 24  Temp:   98.5 F (36.9 C) 97.7 F (36.5 C)  TempSrc:    Oral  SpO2: 98% 98% 98% 92%  Weight:   97.1 kg   Height:   5\' 6"  (1.676 m)    Constitutional: appears age-appropriate, NAD, calm, comfortable Eyes: PERRL, lids and conjunctivae normal ENMT: Mucous membranes are moist. Posterior pharynx clear of any exudate or lesions. Age-appropriate  dentition. Hearing appropriate Neck: normal, supple, no masses, no thyromegaly Respiratory: clear to auscultation on the left side, decreased lung sounds on the right mid to lower lung. no wheezing, no crackles. Normal respiratory effort. No accessory muscle use.  Cardiovascular: Regular rate and rhythm, no murmurs / rubs / gallops. No extremity edema. 2+ pedal pulses. No carotid bruits.  Abdomen: Obese abdomen, no tenderness, no masses palpated, no hepatosplenomegaly. Bowel sounds positive.  Musculoskeletal: no clubbing / cyanosis. No joint deformity upper and lower extremities. Good ROM, no contractures, no atrophy. Normal muscle tone.  Skin: no rashes, lesions, ulcers. No induration Neurologic: Sensation intact. Strength 5/5 in all 4.  Psychiatric: Normal judgment and insight. Alert and oriented x 3. Normal mood.   EKG: independently reviewed, showing sinus tachycardia with rate of 101, QTc 508  Chest x-ray on Admission: I personally reviewed and I agree with radiologist reading as below.  DG Chest 2 View  Result Date: 11/09/2020 CLINICAL DATA:  Shortness of breath.  History of lung cancer EXAM: CHEST - 2 VIEW COMPARISON:  October 23, 2020, November 04, 2020 FINDINGS: The cardiomediastinal silhouette is unchanged in contour with unchanged irregular RIGHT perihilar contours and opacity of the anterior mediastinum.There is a moderate RIGHT pleural effusion, increased in comparison to prior. Trace LEFT pleural effusion. No pneumothorax. Homogeneous opacification of the RIGHT lung base, likely atelectasis. Status post  cholecystectomy. Mild degenerative changes of the thoracic spine. IMPRESSION: 1. Interval increase in size of a moderate RIGHT pleural effusion. 2. Revisualization of malignancy involving the anterior mediastinum and extending along the RIGHT perihilar border. Electronically Signed   By: Valentino Saxon MD   On: 11/09/2020 11:50   Labs on Admission: I have personally reviewed  following labs  CBC: Recent Labs  Lab 11/09/20 1120  WBC 5.5  HGB 12.7  HCT 39.5  MCV 90.0  PLT 093   Basic Metabolic Panel: Recent Labs  Lab 11/09/20 1120  NA 139  K 4.3  CL 101  CO2 27  GLUCOSE 120*  BUN 17  CREATININE 0.72  CALCIUM 9.5   GFR: Estimated Creatinine Clearance: 95.5 mL/min (by C-G formula based on SCr of 0.72 mg/dL).  Urine analysis:    Component Value Date/Time   COLORURINE YELLOW 03/24/2007 0010   APPEARANCEUR CLOUDY (A) 03/24/2007 0010   LABSPEC 1.011 03/24/2007 0010   PHURINE 6.0 03/24/2007 0010   GLUCOSEU NEGATIVE 03/24/2007 0010   HGBUR NEGATIVE 03/24/2007 0010   BILIRUBINUR NEGATIVE 03/24/2007 0010   KETONESUR NEGATIVE 03/24/2007 0010   PROTEINUR NEGATIVE 03/24/2007 0010   UROBILINOGEN 0.2 03/24/2007 0010   NITRITE POSITIVE (A) 03/24/2007 0010   LEUKOCYTESUR SMALL (A) 03/24/2007 0010   Amy N Cox D.O. [facility]  If 7PM-7AM, please contact overnight-coverage provider If 7AM-7PM, please contact day coverage provider [email or web address]  11/09/2020, 8:22 PM

## 2020-11-09 NOTE — ED Notes (Addendum)
Pt ambulated without any difficulty o2 saturation between 94-98% on room air

## 2020-11-09 NOTE — ED Triage Notes (Signed)
Pt in with sob, hx R Lung CA. States hx of pleural effusions, and multiple thoracentesis procedures. Able to speak in short sentences, sats 97% RA

## 2020-11-09 NOTE — ED Notes (Signed)
Pt placed on 2L as O2 drop to 90-91% on Room Air

## 2020-11-09 NOTE — ED Notes (Signed)
Patient is resting comfortably. 

## 2020-11-09 NOTE — ED Notes (Signed)
Report given to kate in C pod

## 2020-11-10 DIAGNOSIS — Z8541 Personal history of malignant neoplasm of cervix uteri: Secondary | ICD-10-CM | POA: Diagnosis not present

## 2020-11-10 DIAGNOSIS — Z79899 Other long term (current) drug therapy: Secondary | ICD-10-CM | POA: Diagnosis not present

## 2020-11-10 DIAGNOSIS — Z20822 Contact with and (suspected) exposure to covid-19: Secondary | ICD-10-CM | POA: Diagnosis present

## 2020-11-10 DIAGNOSIS — Z6834 Body mass index (BMI) 34.0-34.9, adult: Secondary | ICD-10-CM | POA: Diagnosis not present

## 2020-11-10 DIAGNOSIS — R6510 Systemic inflammatory response syndrome (SIRS) of non-infectious origin without acute organ dysfunction: Secondary | ICD-10-CM | POA: Diagnosis present

## 2020-11-10 DIAGNOSIS — J910 Malignant pleural effusion: Secondary | ICD-10-CM | POA: Diagnosis present

## 2020-11-10 DIAGNOSIS — E669 Obesity, unspecified: Secondary | ICD-10-CM | POA: Diagnosis present

## 2020-11-10 DIAGNOSIS — J90 Pleural effusion, not elsewhere classified: Secondary | ICD-10-CM | POA: Diagnosis present

## 2020-11-10 DIAGNOSIS — C3491 Malignant neoplasm of unspecified part of right bronchus or lung: Secondary | ICD-10-CM | POA: Diagnosis present

## 2020-11-10 DIAGNOSIS — R918 Other nonspecific abnormal finding of lung field: Secondary | ICD-10-CM | POA: Diagnosis not present

## 2020-11-10 LAB — BASIC METABOLIC PANEL
Anion gap: 10 (ref 5–15)
BUN: 16 mg/dL (ref 6–20)
CO2: 24 mmol/L (ref 22–32)
Calcium: 9.3 mg/dL (ref 8.9–10.3)
Chloride: 103 mmol/L (ref 98–111)
Creatinine, Ser: 0.63 mg/dL (ref 0.44–1.00)
GFR, Estimated: >60 mL/min (ref >60)
Glucose, Bld: 99 mg/dL (ref 70–99)
Potassium: 3.8 mmol/L (ref 3.5–5.1)
Sodium: 137 mmol/L (ref 135–145)

## 2020-11-10 LAB — CBC
HCT: 38.2 % (ref 36.0–46.0)
Hemoglobin: 12.2 g/dL (ref 12.0–15.0)
MCH: 28.8 pg (ref 26.0–34.0)
MCHC: 31.9 g/dL (ref 30.0–36.0)
MCV: 90.1 fL (ref 80.0–100.0)
Platelets: 337 10*3/uL (ref 150–400)
RBC: 4.24 MIL/uL (ref 3.87–5.11)
RDW: 13.7 % (ref 11.5–15.5)
WBC: 4.9 10*3/uL (ref 4.0–10.5)
nRBC: 0 % (ref 0.0–0.2)

## 2020-11-10 MED ORDER — TRAZODONE HCL 50 MG PO TABS
50.0000 mg | ORAL_TABLET | Freq: Every day | ORAL | Status: DC
  Administered 2020-11-10: 21:00:00 50 mg via ORAL
  Filled 2020-11-10: qty 1

## 2020-11-10 MED ORDER — MELATONIN 5 MG PO TABS
5.0000 mg | ORAL_TABLET | Freq: Every day | ORAL | Status: DC
  Administered 2020-11-11: 5 mg via ORAL
  Filled 2020-11-10 (×2): qty 1

## 2020-11-10 MED ORDER — BUTALBITAL-APAP-CAFFEINE 50-325-40 MG PO TABS
1.0000 | ORAL_TABLET | Freq: Four times a day (QID) | ORAL | Status: DC | PRN
  Administered 2020-11-11: 1 via ORAL
  Filled 2020-11-10 (×3): qty 1

## 2020-11-10 MED ORDER — TRAMADOL HCL 50 MG PO TABS: 50.0000 mg | ORAL_TABLET | Freq: Four times a day (QID) | ORAL | Status: DC | PRN

## 2020-11-10 MED ORDER — HYDROCODONE-ACETAMINOPHEN 5-325 MG PO TABS
1.0000 | ORAL_TABLET | Freq: Four times a day (QID) | ORAL | Status: DC | PRN
  Administered 2020-11-11: 16:00:00 1 via ORAL
  Filled 2020-11-10: qty 1

## 2020-11-10 MED ORDER — ALBUTEROL SULFATE HFA 108 (90 BASE) MCG/ACT IN AERS
2.0000 | INHALATION_SPRAY | RESPIRATORY_TRACT | Status: DC | PRN
  Administered 2020-11-10 – 2020-11-11 (×3): 2 via RESPIRATORY_TRACT
  Filled 2020-11-10: qty 6.7

## 2020-11-10 NOTE — Progress Notes (Signed)
PROGRESS NOTE    Jennifer Cole   NWG:[id number]  DOB: Sep 19, 1965  PCP: Patient, [name]    DOA: 11/09/2020 LOS: 0   Brief Narrative   Summary of HPI on admission: 55 y.o. female with no previous significant medical history, not on medications, with recent diagnosis of right lung malignancy and recurrent right pleural effusion.  She presented to the ED on 11/09/20 for chief concerns of recurrent progressive shortness of breath.    Recent history:  -- CXR on 2/9 for hx of shortness of breath showed moderate R pleural effusion and right hilar mass.  -- CTA chest also on 2/9 confirmed malignant appearing mediastinal mass up to 14.2 cm, encases the anterior mediastinum narrowing the right lung pulmonary arteries, pulmonary veins, and airways.  Top differentials include small cell carcinoma, lymphoma, and non-small cell bronchogenic carcinoma. --Thoracenteses on 2/9, 2/16, and again on 2/21.  Cytology of fluid inconclusive. --PleurX catheter placement planeed outpatient --CT-guided biopsy on 2/21 - flow cytometry of sample was of low viability but showed likely neoplastic B cell process. --Staging: MRI brain negative for metastases.  PET scan showed the malignant findings as above, but negative for abdominal/pelvic metastatic disease or osseous metastatic disease  Hospital Course: Due to tachypnea and hypoxia associated with enlarging pleural effusion, patient admitted to the hospital for close monitoring.  PleurX catheter placement planned for Monday 2/28 when IR service available.   Assessment & Plan   Active Problems:   Recurrent right pleural effusion   Recurrent right malignant pleural effusion -IR was consulted on admission and patient scheduled for placement of Pleurx catheter.  N.p.o. after midnight.  New lung malignancy -definitive diagnosis pending.  Oncology consulted, follows with Dr. Grayland Ormond. Request IR obtain biopsy tomorrow.  SIRS -due to pleural effusion.  No  apparent source of infection, patient not septic at this time.  Obesity: Body mass index is 34.54 kg/m. Complicates overall care and prognosis.  DVT prophylaxis: Place TED hose Start: 11/09/20 1518   Diet:  Diet Orders (From admission, onward)    Start     Ordered   11/09/20 1520  Diet regular Room service appropriate? Yes; Fluid consistency: Thin  Diet effective now       Question Answer Comment  Room service appropriate? Yes   Fluid consistency: Thin      11/09/20 1519            Code Status: Full Code    Subjective 11/10/20    Patient up in chair when seen today.  Reports getting quite short of breath with talking.  Has frequent dry cough.  No fevers or chills.  Asks about her diagnosis and prognosis and we discussed Dr. Grayland Ormond to see her tomorrow.   Disposition Plan & Communication   Status is: Inpatient  Remains inpatient appropriate because:Inpatient level of care appropriate due to severity of illness.  Patient with tenuous respiratory status and awaiting IR procedure tomorrow    Dispo: The patient is from: Home              Anticipated d/c is to: Home              Patient currently is not medically stable to d/c.   Difficult to place patient No         Family Communication: None at bedside, will attempt to call.  Patient able to update   Consults, Procedures, Significant Events   Consultants:   Oncology  Interventional radiology  Procedures:  2/28 -Pleurx catheter placement planned  Antimicrobials:  Anti-infectives (From admission, onward)   None        Micro    Objective   Vitals:   11/09/20 2006 11/10/20 0111 11/10/20 0500 11/10/20 0822  BP: 117/79 104/70 114/70 105/75  Pulse: 98 90 83 88  Resp: (!) 24 (!) 24 20 20   Temp: 97.7 F (36.5 C) 97.6 F (36.4 C) 97.8 F (36.6 C) 98 F (36.7 C)  TempSrc: Oral Oral Oral Oral  SpO2: 92% 96% 95% 96%  Weight:      Height:        Intake/Output Summary (Last 24 hours) at  11/10/2020 0853 Last data filed at 11/09/2020 1920 Gross per 24 hour  Intake 240 ml  Output --  Net 240 ml   Filed Weights   11/09/20 1108 11/09/20 1700  Weight: 97.1 kg 97.1 kg    Physical Exam:  General exam: awake, alert, no acute distress HEENT: atraumatic, clear conjunctiva, anicteric sclera, moist mucus membranes, hearing grossly normal  Respiratory system: CTAB with significantly diminished right base up to mid lung zone posteriorly, no wheezes, rales or rhonchi, conversational dyspnea, frequent dry coughing Cardiovascular system: normal S1/S2, RRR, no pedal edema.   Central nervous system: A&O x3. no gross focal neurologic deficits, normal speech Extremities: moves all, no edema, normal tone Skin: dry, intact, normal temperature Psychiatry: normal mood, congruent affect, judgement and insight appear normal  Labs   Data Reviewed: I have personally reviewed following labs and imaging studies  CBC: Recent Labs  Lab 11/09/20 1120 11/10/20 0322  WBC 5.5 4.9  HGB 12.7 12.2  HCT 39.5 38.2  MCV 90.0 90.1  PLT 327 096   Basic Metabolic Panel: Recent Labs  Lab 11/09/20 1120 11/10/20 0322  NA 139 137  K 4.3 3.8  CL 101 103  CO2 27 24  GLUCOSE 120* 99  BUN 17 16  CREATININE 0.72 0.63  CALCIUM 9.5 9.3   GFR: Estimated Creatinine Clearance: 95.5 mL/min (by C-G formula based on SCr of 0.63 mg/dL). Liver Function Tests: No results for input(s): AST, ALT, ALKPHOS, BILITOT, PROT, ALBUMIN in the last 168 hours. No results for input(s): LIPASE, AMYLASE in the last 168 hours. No results for input(s): AMMONIA in the last 168 hours. Coagulation Profile: No results for input(s): INR, PROTIME in the last 168 hours. Cardiac Enzymes: No results for input(s): CKTOTAL, CKMB, CKMBINDEX, TROPONINI in the last 168 hours. BNP (last 3 results) No results for input(s): PROBNP in the last 8760 hours. HbA1C: No results for input(s): HGBA1C in the last 72 hours. CBG: No results for  input(s): GLUCAP in the last 168 hours. Lipid Profile: No results for input(s): CHOL, HDL, LDLCALC, TRIG, CHOLHDL, LDLDIRECT in the last 72 hours. Thyroid Function Tests: No results for input(s): TSH, T4TOTAL, FREET4, T3FREE, THYROIDAB in the last 72 hours. Anemia Panel: No results for input(s): VITAMINB12, FOLATE, FERRITIN, TIBC, IRON, RETICCTPCT in the last 72 hours. Sepsis Labs: No results for input(s): PROCALCITON, LATICACIDVEN in the last 168 hours.  Recent Results (from the past 240 hour(s))  SARS CORONAVIRUS 2 (TAT 6-24 HRS) Nasopharyngeal Nasopharyngeal Swab     Status: None   Collection Time: 11/01/20  3:16 PM   Specimen: Nasopharyngeal Swab  Result Value Ref Range Status   SARS Coronavirus 2 NEGATIVE NEGATIVE Final    Comment: (NOTE) SARS-CoV-2 target nucleic acids are NOT DETECTED.  The SARS-CoV-2 RNA is generally detectable in upper and lower respiratory specimens during the acute phase  of infection. Negative results do not preclude SARS-CoV-2 infection, do not rule out co-infections with other pathogens, and should not be used as the sole basis for treatment or other patient management decisions. Negative results must be combined with clinical observations, patient history, and epidemiological information. The expected result is Negative.  Fact Sheet for Patients: SugarRoll.be  Fact Sheet for Healthcare Providers: https://www.woods-mathews.com/  This test is not yet approved or cleared by the [place] FDA and  has been authorized for detection and/or diagnosis of SARS-CoV-2 by FDA under an Emergency Use Authorization (EUA). This EUA will remain  in effect (meaning this test can be used) for the duration of the COVID-19 declaration under Se ction 564(b)(1) of the Act, 21 U.S.C. section 360bbb-3(b)(1), unless the authorization is terminated or revoked sooner.  Performed at [facility], [place] [address],  [place], [place] [address]   SARS CORONAVIRUS 2 (TAT 6-24 HRS) Nasopharyngeal Nasopharyngeal Swab     Status: None   Collection Time: 11/06/20 11:43 AM   Specimen: Nasopharyngeal Swab  Result Value Ref Range Status   SARS Coronavirus 2 NEGATIVE NEGATIVE Final    Comment: (NOTE) SARS-CoV-2 target nucleic acids are NOT DETECTED.  The SARS-CoV-2 RNA is generally detectable in upper and lower respiratory specimens during the acute phase of infection. Negative results do not preclude SARS-CoV-2 infection, do not rule out co-infections with other pathogens, and should not be used as the sole basis for treatment or other patient management decisions. Negative results must be combined with clinical observations, patient history, and epidemiological information. The expected result is Negative.  Fact Sheet for Patients: SugarRoll.be  Fact Sheet for Healthcare Providers: https://www.woods-mathews.com/  This test is not yet approved or cleared by the [place] FDA and  has been authorized for detection and/or diagnosis of SARS-CoV-2 by FDA under an Emergency Use Authorization (EUA). This EUA will remain  in effect (meaning this test can be used) for the duration of the COVID-19 declaration under Se ction 564(b)(1) of the Act, 21 U.S.C. section 360bbb-3(b)(1), unless the authorization is terminated or revoked sooner.  Performed at [facility], [place] [address] [place], [place] [address]   Resp Panel by RT-PCR (Flu A&B, Covid) Nasopharyngeal Swab     Status: None   Collection Time: 11/09/20  3:42 PM   Specimen: Nasopharyngeal Swab; Nasopharyngeal(NP) swabs in vial transport medium  Result Value Ref Range Status   SARS Coronavirus 2 by RT PCR NEGATIVE NEGATIVE Final    Comment: (NOTE) SARS-CoV-2 target nucleic acids are NOT DETECTED.  The SARS-CoV-2 RNA is generally detectable in upper respiratory specimens during the acute phase of  infection. The lowest concentration of SARS-CoV-2 viral copies this assay can detect is 138 copies/mL. A negative result does not preclude SARS-Cov-2 infection and should not be used as the sole basis for treatment or other patient management decisions. A negative result may occur with  improper specimen collection/handling, submission of specimen other than nasopharyngeal swab, presence of viral mutation(s) within the areas targeted by this assay, and inadequate number of viral copies(<138 copies/mL). A negative result must be combined with clinical observations, patient history, and epidemiological information. The expected result is Negative.  Fact Sheet for Patients:  EntrepreneurPulse.com.au  Fact Sheet for Healthcare Providers:  IncredibleEmployment.be  This test is no t yet approved or cleared by the [place] FDA and  has been authorized for detection and/or diagnosis of SARS-CoV-2 by FDA under an Emergency Use Authorization (EUA). This EUA will  remain  in effect (meaning this test can be used) for the duration of the COVID-19 declaration under Section 564(b)(1) of the Act, 21 U.S.C.section 360bbb-3(b)(1), unless the authorization is terminated  or revoked sooner.       Influenza A by PCR NEGATIVE NEGATIVE Final   Influenza B by PCR NEGATIVE NEGATIVE Final    Comment: (NOTE) The Xpert Xpress SARS-CoV-2/FLU/RSV plus assay is intended as an aid in the diagnosis of influenza from Nasopharyngeal swab specimens and should not be used as a sole basis for treatment. Nasal washings and aspirates are unacceptable for Xpert Xpress SARS-CoV-2/FLU/RSV testing.  Fact Sheet for Patients: EntrepreneurPulse.com.au  Fact Sheet for Healthcare Providers: IncredibleEmployment.be  This test is not yet approved or cleared by the [place] FDA and has been authorized for detection and/or diagnosis of SARS-CoV-2  by FDA under an Emergency Use Authorization (EUA). This EUA will remain in effect (meaning this test can be used) for the duration of the COVID-19 declaration under Section 564(b)(1) of the Act, 21 U.S.C. section 360bbb-3(b)(1), unless the authorization is terminated or revoked.  Performed at [facility], [address], [place], [place] [address]       Imaging Studies   DG Chest 2 View  Result Date: 11/09/2020 CLINICAL DATA:  Shortness of breath.  History of lung cancer EXAM: CHEST - 2 VIEW COMPARISON:  October 23, 2020, November 04, 2020 FINDINGS: The cardiomediastinal silhouette is unchanged in contour with unchanged irregular RIGHT perihilar contours and opacity of the anterior mediastinum.There is a moderate RIGHT pleural effusion, increased in comparison to prior. Trace LEFT pleural effusion. No pneumothorax. Homogeneous opacification of the RIGHT lung base, likely atelectasis. Status post cholecystectomy. Mild degenerative changes of the thoracic spine. IMPRESSION: 1. Interval increase in size of a moderate RIGHT pleural effusion. 2. Revisualization of malignancy involving the anterior mediastinum and extending along the RIGHT perihilar border. Electronically Signed   By: Valentino Saxon MD   On: 11/09/2020 11:50     Medications   Scheduled Meds: . enoxaparin (LOVENOX) injection  0.5 mg/kg Subcutaneous Q24H   Continuous Infusions:     LOS: 0 days    Time spent: 30 minutes    Ezekiel Slocumb, DO [facility]  11/10/2020, 8:53 AM      If 7PM-7AM, please contact night-coverage. How to contact the [facility] Attending or Consulting provider [place] or covering provider during after hours [place], for this patient?    1. Check the care team in [facility] and look for a) attending/consulting [facility] provider listed and b) the [facility] team listed 2. Log into [email or web address] and use [place]'s universal password to access. If you do not have the password, please contact  the hospital operator. 3. Locate the [facility] provider you are looking for under [facility] and page to a number that you can be directly reached. 4. If you still have difficulty reaching the provider, please page the [facility] (Director on Call) for the Hospitalists listed on amion for assistance.

## 2020-11-10 NOTE — Hospital Course (Addendum)
Summary of HPI on admission: 55 y.o. female with no previous significant medical history, not on medications, with recent diagnosis of right lung malignancy and recurrent right pleural effusion.  She presented to the ED on 11/09/20 for chief concerns of recurrent progressive shortness of breath.    Recent history:  -- CXR on 2/9 for hx of shortness of breath showed moderate R pleural effusion and right hilar mass.  -- CTA chest also on 2/9 confirmed malignant appearing mediastinal mass up to 14.2 cm, encases the anterior mediastinum narrowing the right lung pulmonary arteries, pulmonary veins, and airways.  Top differentials include small cell carcinoma, lymphoma, and non-small cell bronchogenic carcinoma. --Thoracenteses on 2/9, 2/16, and again on 2/21.  Cytology of fluid inconclusive. --PleurX catheter placement planeed outpatient --CT-guided biopsy on 2/21 - flow cytometry of sample was of low viability but showed likely neoplastic B cell process. --Staging: MRI brain negative for metastases.  PET scan showed the malignant findings as above, but negative for abdominal/pelvic metastatic disease or osseous metastatic disease  Hospital Course: Due to tachypnea and hypoxia associated with enlarging pleural effusion, patient admitted to the hospital for close monitoring.

## 2020-11-11 ENCOUNTER — Ambulatory Visit
Admission: RE | Admit: 2020-11-11 | Discharge: 2020-11-11 | Disposition: A | Discharge status: Home / Self Care | Payer: 59 | Source: Ambulatory Visit | Attending: Oncology | Admitting: Oncology

## 2020-11-11 DIAGNOSIS — J90 Pleural effusion, not elsewhere classified: Secondary | ICD-10-CM | POA: Diagnosis not present

## 2020-11-11 DIAGNOSIS — J910 Malignant pleural effusion: Secondary | ICD-10-CM | POA: Diagnosis not present

## 2020-11-11 DIAGNOSIS — R918 Other nonspecific abnormal finding of lung field: Secondary | ICD-10-CM | POA: Diagnosis not present

## 2020-11-11 HISTORY — PX: IR PERC PLEURAL DRAIN W/INDWELL CATH W/IMG GUIDE: IMG5383

## 2020-11-11 MED ORDER — MIDAZOLAM HCL 2 MG/2ML IJ SOLN
INTRAMUSCULAR | Status: AC
  Filled 2020-11-11: qty 2

## 2020-11-11 MED ORDER — CEFAZOLIN SODIUM-DEXTROSE 1-4 GM/50ML-% IV SOLN
INTRAVENOUS | Status: AC | PRN
  Administered 2020-11-11: 2 g via INTRAVENOUS

## 2020-11-11 MED ORDER — HYDROCODONE-ACETAMINOPHEN 5-325 MG PO TABS: 1.0000 | ORAL_TABLET | Freq: Four times a day (QID) | ORAL | 0 refills | Status: DC | PRN

## 2020-11-11 MED ORDER — MIDAZOLAM HCL 2 MG/2ML IJ SOLN
INTRAMUSCULAR | Status: AC | PRN
  Administered 2020-11-11 (×3): 1 mg via INTRAVENOUS

## 2020-11-11 MED ORDER — CEFAZOLIN SODIUM-DEXTROSE 2-4 GM/100ML-% IV SOLN: 2.0000 g | INTRAVENOUS | Status: DC

## 2020-11-11 MED ORDER — FENTANYL CITRATE (PF) 100 MCG/2ML IJ SOLN
INTRAMUSCULAR | Status: AC
  Filled 2020-11-11: qty 2

## 2020-11-11 MED ORDER — FENTANYL CITRATE (PF) 100 MCG/2ML IJ SOLN
INTRAMUSCULAR | Status: AC | PRN
  Administered 2020-11-11 (×3): 50 ug via INTRAVENOUS

## 2020-11-11 NOTE — H&P (Signed)
Chief Complaint: Patient was seen in consultation today for tunneled pleural catheter  Referring Physician(s): Lloyd Huger  Supervising Physician: Ruthann Cancer  Patient Status: [name] - In-pt  History of Present Illness: Jennifer Cole is a 55 y.o. female with a remote history of cervical cancer. She presented to the [facility] ED 10/23/20 with shortness of breath and a non-productive cough. CXR was positive for a moderately-sized right pleural effusion. Additional imaging was negative for a PE but a large, right-sided mediastinal mass was identified. She underwent a thoracentesis that day with 1.1 liter of clear fluid removed. She was stable for discharge afterwards and she returned to IR as an outpatient on 11/04/20 for a thoracentesis and a mediastinal mass biopsy. The flow cytometry of the sample was of low viability but showed likely neoplastic B-cell process.   Due to the rapid reaccumulation of pleural fluid the patient was referred to IR for a tunneled pleural catheter. This case was reviewed and procedure approved by Dr. Pascal Lux. This was to be done as an outpatient but she became severely short of breath and returned to the ED 11/09/20.  She was admitted for symptom management.   Past Medical History:  Diagnosis Date  . Cancer ([place])    cervical    No past surgical history on file.  Allergies: Patient has no known allergies.  Medications: Prior to Admission medications   Medication Sig Start Date End Date Taking? Authorizing Provider  acetaminophen (TYLENOL) 500 MG tablet Take 1 tablet (500 mg total) by mouth every 6 (six) hours as needed. 02/06/19   Law, Bea Graff, PA-C  albuterol (VENTOLIN HFA) 108 (90 Base) MCG/ACT inhaler Inhale 2 puffs into the lungs every 6 (six) hours as needed for wheezing or shortness of breath. 10/28/20   Lloyd Huger, MD  folic acid (FOLVITE) 1 MG tablet Take 1 mg by mouth daily.    [provider]  ibuprofen (ADVIL) 600 MG  tablet Take 1 tablet (600 mg total) by mouth every 6 (six) hours as needed. 02/06/19   Frederica Kuster, PA-C     No family history on file.  Social History   Socioeconomic History  . Marital status: Legally Separated    Spouse name: Not on file  . Number of children: Not on file  . Years of education: Not on file  . Highest education level: Not on file  Occupational History  . Not on file  Tobacco Use  . Smoking status: Never Smoker  . Smokeless tobacco: Never Used  Substance and Sexual Activity  . Alcohol use: Never  . Drug use: Not on file  . Sexual activity: Not on file  Other Topics Concern  . Not on file  Social History Narrative  . Not on file   Social Determinants of Health   Financial Resource Strain: Not on file  Food Insecurity: Not on file  Transportation Needs: Not on file  Physical Activity: Not on file  Stress: Not on file  Social Connections: Not on file    Review of Systems: A 12 point ROS discussed and pertinent positives are indicated in the HPI above.  All other systems are negative.  Review of Systems  Constitutional: Negative for appetite change and fatigue.  Respiratory: Positive for shortness of breath. Negative for cough.   Cardiovascular: Negative for chest pain and leg swelling.  Gastrointestinal: Negative for abdominal pain, diarrhea, nausea and vomiting.  Musculoskeletal: Negative for back pain.  Neurological: Negative for dizziness and headaches.  Vital Signs: Temp: 97.8, BP 95/75, HR 94, RR 18, Oxygen saturation 96% on room air   Physical Exam Constitutional:      General: She is not in acute distress.    Appearance: She is not ill-appearing.  HENT:     Mouth/Throat:     Mouth: Mucous membranes are moist.     Pharynx: Oropharynx is clear.  Cardiovascular:     Rate and Rhythm: Normal rate and regular rhythm.     Pulses: Normal pulses.     Heart sounds: Normal heart sounds.  Pulmonary:     Breath sounds: Examination of the  right-upper field reveals decreased breath sounds. Decreased breath sounds present.  Abdominal:     General: Bowel sounds are normal.     Tenderness: There is no abdominal tenderness.  Musculoskeletal:        General: Normal range of motion.     Cervical back: Normal range of motion.  Skin:    General: Skin is warm and dry.  Neurological:     Mental Status: She is alert and oriented to person, place, and time.  Psychiatric:        Mood and Affect: Mood normal.        Behavior: Behavior normal.        Thought Content: Thought content normal.        Judgment: Judgment normal.     Imaging: DG Chest 2 View  Result Date: 11/09/2020 CLINICAL DATA:  Shortness of breath.  History of lung cancer EXAM: CHEST - 2 VIEW COMPARISON:  October 23, 2020, November 04, 2020 FINDINGS: The cardiomediastinal silhouette is unchanged in contour with unchanged irregular RIGHT perihilar contours and opacity of the anterior mediastinum.There is a moderate RIGHT pleural effusion, increased in comparison to prior. Trace LEFT pleural effusion. No pneumothorax. Homogeneous opacification of the RIGHT lung base, likely atelectasis. Status post cholecystectomy. Mild degenerative changes of the thoracic spine. IMPRESSION: 1. Interval increase in size of a moderate RIGHT pleural effusion. 2. Revisualization of malignancy involving the anterior mediastinum and extending along the RIGHT perihilar border. Electronically Signed   By: Valentino Saxon MD   On: 11/09/2020 11:50   DG Chest 2 View  Result Date: 10/28/2020 CLINICAL DATA:  Shortness of breath EXAM: CHEST - 2 VIEW COMPARISON:  Chest radiograph and chest CT October 23, 2020 FINDINGS: Right pleural effusion, increased in size. Airspace opacity/consolidation with obstruction of the right lower lobe bronchus; CT shows large mass in this area. Left lung is clear. Heart is upper normal in size. Pulmonary vascularity on the left is normal. Pulmonary vascularity on the right  appears grossly normal, although there is soft tissue fullness in this area due to adenopathy which is evident on recent CT. No appreciable bone lesions. IMPRESSION: Pleural effusion on the right has increased in size. Extensive mass with consolidation throughout portions of the right middle lobe region with mass arising from the right mediastinum and potentially pericardium. There is right hilar adenopathy, better delineated by CT. Left lung is clear. Stable cardiac silhouette. Electronically Signed   By: Lowella Grip III M.D.   On: 10/28/2020 11:31   DG Chest 2 View  Result Date: 10/23/2020 CLINICAL DATA:  55 year old female with increasing shortness of breath for 2 days. EXAM: CHEST - 2 VIEW COMPARISON:  None. FINDINGS: Moderate size layering right pleural effusion with dense right lung base opacification. No superimposed pulmonary edema or pneumothorax. The left lung appears to remain clear. Possible cardiomegaly. Right hilum and right  heart border are obscured. Furthermore, possible tapered appearance of the right bronchus intermedius. Visualized tracheal air column is within normal limits. No acute osseous abnormality identified. Negative visible bowel gas pattern. Surgical clips in the upper abdomen. IMPRESSION: 1. Moderate size right pleural effusion. Underlying mass or pneumonia not excluded. 2. Questionable cardiomegaly.  No overt edema. Electronically Signed   By: Genevie Ann M.D.   On: 10/23/2020 08:51   CT Angio Chest PE W/Cm &/Or Wo Cm  Result Date: 10/23/2020 CLINICAL DATA:  55 year old female with increasing shortness of breath and abnormal chest x-ray this morning. EXAM: CT ANGIOGRAPHY CHEST WITH CONTRAST TECHNIQUE: Multidetector CT imaging of the chest was performed using the standard protocol during bolus administration of intravenous contrast. Multiplanar CT image reconstructions and MIPs were obtained to evaluate the vascular anatomy. CONTRAST:  60mL OMNIPAQUE IOHEXOL 350 MG/ML SOLN  COMPARISON:  Chest radiographs 10/23/2020. FINDINGS: Cardiovascular: Adequate contrast bolus timing in the pulmonary arterial tree. Respiratory motion degrades detail of the distal left lower lobe pulmonary arteries. Elsewhere no focal filling defect identified in the pulmonary arteries to suggest acute pulmonary embolism. However, there is a large mediastinal mass which severely narrows the right middle and upper lobe pulmonary artery branches (series 6, image 121). The mass infiltrates the superior mediastinum and is also inseparable from the ascending aorta, some of the proximal great vessels, cardiac base and right heart border. No pericardial effusion. No cardiomegaly. Aorta and proximal great vessels remain patent without irregularity. The mass also severely attenuates the right pulmonary veins. Mediastinum/Nodes: Large, infiltrative mediastinal mass occupying the superior and anterior mediastinum tracking inferiorly on the right. The mass encompasses 9.9 x 14.1 x 14.2 cm (AP by transverse by CC). See series 5, image 51 and coronal series 10, image 30. Slightly Discontinuous ex-nodal disease is noted about the disease great vessels. There is no lymphadenopathy in the visible lower neck or in the axilla. Associated regional mass effect, including on the right airways, see below. Lungs/Pleura: Moderate to large mixed subpleural and layering right pleural effusion with simple fluid density. Compressive atelectasis of virtually the entire right lower lobe. Atelectasis of the right middle lobe is related both to pleural fluid and the large mediastinal mass. There is irregular narrowing of the bronchus intermedius (series 7, image 42) and the other right hilar airways are also encased by tumor. Major airways do remain patent. In the left lung 2 indeterminate although somewhat sub solid appearing pulmonary nodules are identified in the peripheral upper lobe (series 7, image 28, 4-5 mm) and lower lobe just above the  diaphragm (6-7 mm image 71). Trace left pleural fluid. Upper Abdomen: Absent gallbladder. Visible liver, spleen, pancreas, adrenal glands, kidneys and bowel remain within normal limits. Musculoskeletal: No sternal or rib destruction related to the mass. No acute or suspicious osseous lesion is identified. Review of the MIP images confirms the above findings. IMPRESSION: 1. 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 Discontinuous ex-nodal disease in the superior prevascular space. Top differential considerations include small cell carcinoma, lymphoma, and non-small cell bronchogenic carcinoma. 2. Superimposed moderate to large layering and sub-pulmonic right pleural effusion associated right lung atelectasis. 3. Two small indeterminate left lung pulmonary nodules. 4. Negative for acute pulmonary embolus. No metastatic disease identified in the upper abdomen. Electronically Signed   By: Genevie Ann M.D.   On: 10/23/2020 11:30   MR Brain W Wo  Contrast  Result Date: 10/30/2020 CLINICAL DATA:  Staging of recently diagnosed lung cancer. EXAM: MRI HEAD WITHOUT AND WITH CONTRAST TECHNIQUE: Multiplanar, multiecho pulse sequences of the brain and surrounding structures were obtained without and with intravenous contrast. CONTRAST:  66mL GADAVIST GADOBUTROL 1 MMOL/ML IV SOLN COMPARISON:  None. FINDINGS: Brain: There is no evidence of an acute infarct, intracranial hemorrhage, mass, midline shift, or extra-axial fluid collection. The ventricles and sulci are normal. A few punctate foci of T2 hyperintensity in the cerebral white matter are nonspecific and considered to be within normal limits for age. No abnormal enhancement is identified. Vascular: Major intracranial vascular flow voids are preserved. Skull and upper cervical spine: Unremarkable bone marrow signal. Sinuses/Orbits:  Unremarkable orbits. Clear paranasal sinuses. Small bilateral mastoid effusions. Other: None. IMPRESSION: Unremarkable appearance of the brain for age. No evidence of intracranial metastases. Electronically Signed   By: Logan Bores M.D.   On: 10/30/2020 08:17   NM PET Image Initial (PI) Skull Base To Thigh  Result Date: 10/30/2020 CLINICAL DATA:  Initial treatment strategy for mediastinal mass. EXAM: NUCLEAR MEDICINE PET SKULL BASE TO THIGH TECHNIQUE: 11.4 mCi F-18 FDG was injected intravenously. Full-ring PET imaging was performed from the skull base to thigh after the radiotracer. CT data was obtained and used for attenuation correction and anatomic localization. Fasting blood glucose: 72 mg/dl COMPARISON:  CT scan 10/30/2020 FINDINGS: Mediastinal blood pool activity: SUV max 1.97 Liver activity: SUV max NA NECK: No neck mass or adenopathy. Incidental CT findings: none CHEST: Hypermetabolic soft tissue mass noted in the left upper chest wall area with ill-defined soft tissue density which could be a nodal mass measures a maximum of 14 mm and the SUV max is 19.07. Small subclavicular lymph node measures 8 mm on image 63/3 and has an SUV max of 3.37. No breast masses, axillary or subpectoral adenopathy. Large anterior mediastinal mass also invading the middle mediastinum on the right side. The lesion is markedly hypermetabolic with SUV max of 73.71. There is an area of chest wall invasion along the left side of the sternum. There is also a separate pleural nodule on the left side measuring 10 mm with an SUV max of 4.20. A small pleural nodule is also noted posterior to the descending thoracic aorta on image number 74/3. It measures 10 mm and the SUV max is 6.21. Moderate-sized right pleural effusion is noted but no definite hypermetabolic right-sided pleural nodules. A few small indeterminate pulmonary nodules are also noted. Incidental CT findings: none ABDOMEN/PELVIS: No hypermetabolic solid abdominal organ  lesions and no hypermetabolic lymphadenopathy. No inguinal adenopathy. Incidental CT findings: none SKELETON: No osseous lesions are identified. Incidental CT findings: none IMPRESSION: 1. Large markedly hypermetabolic anterior mediastinal mass also involving the middle mediastinum on the right side. Findings suspicious for thymic neoplasm. Lymphoma and germ cell tumors would be other possibilities. The lesion appears to invade the left chest wall along the lateral margin of the sternum. 2. Associated small hypermetabolic pleural nodules on the left side. 3. 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 4. A few small indeterminate pulmonary nodules are noted. 5. Hypermetabolic soft tissue mass in the left chest wall anterior to the left first rib could represent adenopathy. 6. No findings for abdominal/pelvic metastatic disease or osseous metastatic disease. Electronically Signed   By: Marijo Sanes M.D.   On: 10/30/2020 17:06   CT BIOPSY  Result Date: 11/04/2020 INDICATION: 55 year old with a large mediastinal mass. Tissue diagnosis is needed. EXAM: CT-GUIDED CORE BIOPSY OF  MEDIASTINAL MASS MEDICATIONS: None. ANESTHESIA/SEDATION: Moderate (conscious) sedation was employed during this procedure. A total of Versed 2.0 mg and Fentanyl 100 mcg was administered intravenously. Moderate Sedation Time: 16 minutes. The patient's level of consciousness and vital signs were monitored continuously by radiology nursing throughout the procedure under my direct supervision. FLUOROSCOPY TIME:  Fluoroscopy Time: None COMPLICATIONS: None immediate. PROCEDURE: Informed written consent was obtained from the patient after a thorough discussion of the procedural risks, benefits and alternatives. All questions were addressed. A timeout was performed prior to the initiation of the procedure. Patient was placed supine on the CT scanner. CT images through the chest were obtained. The anterior chest was prepped with chlorhexidine and sterile  field was created. Skin and soft tissues were anesthetized with 1% lidocaine. Using CT guidance, a 17 gauge coaxial needle was directed into the anterior mediastinal mass along the left side of the sternum. Needle was directed into the mediastinal mass and needle position was confirmed with CT. Three core biopsies were obtained with an 18 gauge core device. Specimens were placed on Telfa pad with saline. Needle was removed without complication and follow up CT images were obtained. Bandage placed over the puncture site. FINDINGS: Again noted is a large anterior mediastinal mass with low-density along the right side of the mass. Biopsy needle was directed into the mass along the left side of the sternum. Needle position confirmed within the mediastinal mass. No significant bleeding or hematoma formation at the end of the procedure. Small loculated right pleural effusion. Tiny focus of gas within the right pleural effusion related to recent thoracentesis. No significant pleural gas. Again noted is a small nodule in the left upper lobe measuring close to 6 mm. IMPRESSION: CT-guided core biopsy of the anterior mediastinal mass. Electronically Signed   By: Markus Daft M.D.   On: 11/04/2020 15:34   DG Chest Port 1 View  Result Date: 11/04/2020 CLINICAL DATA:  Status post thoracentesis EXAM: PORTABLE CHEST 1 VIEW COMPARISON:  October 30, 2020. FINDINGS: No pneumothorax evident. Small right pleural effusion evident. There is mild right base atelectasis. The lungs elsewhere are clear. There is stable cardiomegaly with pulmonary vascularity normal. No adenopathy. No bone lesions. IMPRESSION: No appreciable pneumothorax. Small right pleural effusion with right base atelectasis. No consolidation. Stable cardiomegaly. Electronically Signed   By: Lowella Grip III M.D.   On: 11/04/2020 15:15   DG Chest Port 1 View  Result Date: 10/30/2020 CLINICAL DATA:  Status post thoracentesis. EXAM: PORTABLE CHEST 1 VIEW  COMPARISON:  10/28/2020 FINDINGS: Interval near complete evacuation of the right pleural fluid collection. No postprocedural pneumothorax is identified. Streaky right basilar atelectasis. IMPRESSION: Near complete evacuation of right pleural fluid collection without postprocedural pneumothorax. Electronically Signed   By: Marijo Sanes M.D.   On: 10/30/2020 13:04   DG Chest Port 1 View  Result Date: 10/23/2020 CLINICAL DATA:  Post thoracentesis EXAM: PORTABLE CHEST 1 VIEW COMPARISON:  CT 10/23/2020 FINDINGS: Interval decrease in the size of the right pleural effusion with some small to moderate volume residual layering fluid in the right lung base. Suspect at least trace left effusion. Residual atelectatic changes are noted in both lungs. Redemonstration of the irregular heterogeneous mediastinal contours compatible with the infiltrative mediastinal mass seen on comparison CT imaging. Previously seen pulmonary nodules are less well visualized radiographically. No visible pneumothorax. No acute osseous or soft tissue abnormality. Telemetry leads overlie the chest. IMPRESSION: 1. Interval decrease in the size of the right pleural effusion with  some small to moderate volume residual layering fluid in the right lung base. No pneumothorax. Suspect at least trace left effusion. 2. Irregular mediastinal margins compatible with the infiltrative mass seen on comparison CT. Electronically Signed   By: Lovena Le M.D.   On: 10/23/2020 16:11   US THORACENTESIS ASP PLEURAL SPACE W/IMG GUIDE  Result Date: 11/04/2020 INDICATION: Mediastinal mass. Shortness of breath. Recurrent right pleural effusion. Request for therapeutic thoracentesis. EXAM: ULTRASOUND GUIDED RIGHT THORACENTESIS MEDICATIONS: 1% plain lidocaine, 10 mL COMPLICATIONS: None immediate. PROCEDURE: An ultrasound guided thoracentesis was thoroughly discussed with the patient and questions answered. The benefits, risks, alternatives and complications were also  discussed. The patient understands and wishes to proceed with the procedure. Written consent was obtained. Ultrasound was performed to localize and mark an adequate pocket of fluid in the right chest. The area was then prepped and draped in the normal sterile fashion. 1% Lidocaine was used for local anesthesia. Under ultrasound guidance a 6 Fr Safe-T-Centesis catheter was introduced. Thoracentesis was performed. The catheter was removed and a dressing applied. FINDINGS: A total of approximately 900 mL of clear yellow fluid was removed. Ultrasound of the right chest demonstrates right pleural effusion with interval development of loculations. IMPRESSION: Successful ultrasound guided right thoracentesis yielding 900 mL of pleural fluid. Read by: Ascencion Dike PA-C Electronically Signed   By: Markus Daft M.D.   On: 11/04/2020 12:52   US THORACENTESIS ASP PLEURAL SPACE W/IMG GUIDE  Result Date: 10/30/2020 INDICATION: 55 year old with mediastinal mass and recurrent right pleural effusion. EXAM: ULTRASOUND GUIDED RIGHT THORACENTESIS MEDICATIONS: None. COMPLICATIONS: None immediate. PROCEDURE: An ultrasound guided thoracentesis was thoroughly discussed with the patient and questions answered. The benefits, risks, alternatives and complications were also discussed. The patient understands and wishes to proceed with the procedure. Written consent was obtained. Ultrasound was performed to localize and mark an adequate pocket of fluid in the right chest. The area was then prepped and draped in the normal sterile fashion. 1% Lidocaine was used for local anesthesia. Under ultrasound guidance a 6 Fr Safe-T-Centesis catheter was introduced. Thoracentesis was performed. The catheter was removed and a dressing applied. FINDINGS: A total of approximately 1.3 L of yellow fluid was removed. IMPRESSION: Successful ultrasound guided right thoracentesis yielding 1.3 L of pleural fluid. Electronically Signed   By: Markus Daft M.D.   On:  10/30/2020 13:29   US THORACENTESIS ASP PLEURAL SPACE W/IMG GUIDE  Result Date: 10/23/2020 INDICATION: RIGHT PLEURAL EFFUSION EXAM: ULTRASOUND GUIDED RIGHT THORACENTESIS MEDICATIONS: 1% LIDOCAINE LOCAL COMPLICATIONS: None immediate. PROCEDURE: An ultrasound guided thoracentesis was thoroughly discussed with the patient and questions answered. The benefits, risks, alternatives and complications were also discussed. The patient understands and wishes to proceed with the procedure. Written consent was obtained. Ultrasound was performed to localize and mark an adequate pocket of fluid in the right chest. The area was then prepped and draped in the normal sterile fashion. 1% Lidocaine was used for local anesthesia. Under ultrasound guidance a 6 Fr Safe-T-Centesis catheter was introduced. Thoracentesis was performed. The catheter was removed and a dressing applied. FINDINGS: A total of approximately 1.1 L of CLEAR PLEURAL fluid was removed. Samples were sent to the laboratory as requested by the clinical team. IMPRESSION: Successful ultrasound guided right thoracentesis yielding 1.1 L of pleural fluid. Electronically Signed   By: Jerilynn Mages.  Shick M.D.   On: 10/23/2020 15:59    Labs:  CBC: Recent Labs    10/23/20 0752 11/09/20 1120 11/10/20 0322  WBC 6.0 5.5  4.9  HGB 12.3 12.7 12.2  HCT 38.4 39.5 38.2  PLT 322 327 337    COAGS: No results for input(s): INR, APTT in the last 8760 hours.  BMP: Recent Labs    10/23/20 0752 11/09/20 1120 11/10/20 0322  NA 141 139 137  K 3.9 4.3 3.8  CL 102 101 103  CO2 26 27 24   GLUCOSE 130* 120* 99  BUN 17 17 16   CALCIUM 9.5 9.5 9.3  CREATININE 0.65 0.72 0.63  GFRNONAA >60 >60 >60    LIVER FUNCTION TESTS: No results for input(s): BILITOT, AST, ALT, ALKPHOS, PROT, ALBUMIN in the last 8760 hours.  TUMOR MARKERS: No results for input(s): AFPTM, CEA, CA199, CHROMGRNA in the last 8760 hours.  Assessment and Plan:  Right lung mass; recurrent right pleural  effusion: [name], 55 year old female, presents today to the [facility] Interventional Radiology department for an image-guided tunneled pleural catheter.   Risks and benefits discussed with the patient including bleeding, infection, damage to adjacent structures, malfunction of the catheter with need for additional procedures. All of the patient's questions were answered, patient is agreeable to proceed. She has been NPO. Labs and vitals have been reviewed.   Consent signed and in chart.  Thank you for this interesting consult.  I greatly enjoyed meeting Tajia Szeliga and look forward to participating in their care.  A copy of this report was sent to the requesting provider on this date.  Electronically Signed: Soyla Dryer, AGACNP-BC [phone] 11/11/2020, 12:47 PM   I spent a total of 20 Minutes    in face to face in clinical consultation, greater than 50% of which was counseling/coordinating care for tunneled pleural catheter.

## 2020-11-11 NOTE — Procedures (Signed)
Interventional Radiology Procedure Note  Procedure: Right pleurex catheter placement  Findings: Please refer to procedural dictation for full description.  15.5 Fr right pleurex catheter placement.  50 mL translucent, straw colored fluid removed.  Complications: None immediate  Estimated Blood Loss: < 5 mL  Recommendations: Local wound care.  IR will follow.   Ruthann Cancer, MD Pager: [phone]

## 2020-11-11 NOTE — Discharge Summary (Addendum)
Physician Discharge Summary  Jennifer Cole WUG:[id number] DOB: 12-21-1965 DOA: 11/09/2020  PCP: Patient, [name]  Admit date: 11/09/2020 Discharge date: 11/20/2020  Admitted From: home Disposition:  home  Recommendations for Outpatient Follow-up:  Follow up with PCP in 1-2 weeks Please obtain BMP/CBC in one week Please follow up tomorrow with Dr. Grayland Ormond in the cancer center  Home Health: No  Equipment/Devices: None   Discharge Condition: Stable  CODE STATUS: Full  Diet recommendation: Regular    Discharge Diagnoses: Active Problems:   Recurrent right pleural effusion   Malignant pleural effusion    Summary of HPI and Hospital Course:  Summary of HPI on admission: 55 y.o. female with no previous significant medical history, not on medications, with recent diagnosis of right lung malignancy and recurrent right pleural effusion.  She presented to the ED on 11/09/20 for chief concerns of recurrent progressive shortness of breath.    Recent history:  -- CXR on 2/9 for hx of shortness of breath showed moderate R pleural effusion and right hilar mass.  -- CTA chest also on 2/9 confirmed malignant appearing mediastinal mass up to 14.2 cm, encases the anterior mediastinum narrowing the right lung pulmonary arteries, pulmonary veins, and airways.  Top differentials include small cell carcinoma, lymphoma, and non-small cell bronchogenic carcinoma. --Thoracenteses on 2/9, 2/16, and again on 2/21.  Cytology of fluid inconclusive. --PleurX catheter placement planeed outpatient --CT-guided biopsy on 2/21 - flow cytometry of sample was of low viability but showed likely neoplastic B cell process. --Staging: MRI brain negative for metastases.  PET scan showed the malignant findings as above, but negative for abdominal/pelvic metastatic disease or osseous metastatic disease  Hospital Course: Due to tachypnea and hypoxia associated with enlarging pleural effusion, patient admitted to the  hospital for close monitoring.        Recurrent right malignant pleural effusion -IR was consulted on admission.  PleurX catheter placed on 2/28.   New lung malignancy -definitive diagnosis is still pending.  Oncology consulted, follows with Dr. Grayland Ormond. IR obtained biopsy 2/28.  Pathology pending at d/c.   SIRS -due to pleural effusion.  No apparent source of infection, patient not septic at this time.  Dyspnea / without acute hypoxic respiratory failure - due to pleural effusion; pt presented quite short of breath, but maintained oxygen saturations.   Obesity: Body mass index is 34.54 kg/m. Complicates overall care and prognosis.      Discharge Instructions   Discharge Instructions     Call MD for:  extreme fatigue   Complete by: As directed    Call MD for:  persistant dizziness or light-headedness   Complete by: As directed    Call MD for:  severe uncontrolled pain   Complete by: As directed    Call MD for:  temperature >100.4   Complete by: As directed    Diet - low sodium heart healthy   Complete by: As directed    Discharge instructions   Complete by: As directed    Follow up with Dr. Grayland Ormond tomorrow at the [facility], as previously scheduled.  I sent some pain medication to your pharmacy, in case you have pain around the PleurX catheter. Tylenol with ibuprofen is often just as, or even more, effective than stronger pain medicine, so would recommend trying those first, use hydrocodone if those alone are not enough.  Dr. Grayland Ormond will discuss in detail with you - definitive diagnosis, treatment plans and prognosis tomorrow.   Increase activity slowly  Complete by: As directed       Allergies as of 11/11/2020   No Known Allergies      Medication List     TAKE these medications    acetaminophen 500 MG tablet Commonly known as: TYLENOL Take 1 tablet (500 mg total) by mouth every 6 (six) hours as needed.   albuterol 108 (90 Base) MCG/ACT  inhaler Commonly known as: VENTOLIN HFA Inhale 2 puffs into the lungs every 6 (six) hours as needed for wheezing or shortness of breath.   folic acid 1 MG tablet Commonly known as: FOLVITE Take 1 mg by mouth daily.   ibuprofen 600 MG tablet Commonly known as: ADVIL Take 1 tablet (600 mg total) by mouth every 6 (six) hours as needed.       ASK your doctor about these medications    HYDROcodone-acetaminophen 5-325 MG tablet Commonly known as: NORCO/VICODIN Take 1 tablet by mouth every 6 (six) hours as needed for up to 5 days for moderate pain or severe pain. Ask about: Should I take this medication?         No Known Allergies   If you experience worsening of your admission symptoms, develop shortness of breath, life threatening emergency, suicidal or homicidal thoughts you must seek medical attention immediately by calling 911 or calling your MD immediately  if symptoms less severe.    Please note   You were cared for by a hospitalist during your hospital stay. If you have any questions about your discharge medications or the care you received while you were in the hospital after you are discharged, you can call the unit and asked to speak with the hospitalist on call if the hospitalist that took care of you is not available. Once you are discharged, your primary care physician will handle any further medical issues. Please note that NO REFILLS for any discharge medications will be authorized once you are discharged, as it is imperative that you return to your primary care physician (or establish a relationship with a primary care physician if you do not have one) for your aftercare needs so that they can reassess your need for medications and monitor your lab values.   Consultations: Oncology Interventional radiology   Procedures/Studies: DG Chest 2 View  Result Date: 11/09/2020 CLINICAL DATA:  Shortness of breath.  History of lung cancer EXAM: CHEST - 2 VIEW COMPARISON:   October 23, 2020, November 04, 2020 FINDINGS: The cardiomediastinal silhouette is unchanged in contour with unchanged irregular RIGHT perihilar contours and opacity of the anterior mediastinum.There is a moderate RIGHT pleural effusion, increased in comparison to prior. Trace LEFT pleural effusion. No pneumothorax. Homogeneous opacification of the RIGHT lung base, likely atelectasis. Status post cholecystectomy. Mild degenerative changes of the thoracic spine. IMPRESSION: 1. Interval increase in size of a moderate RIGHT pleural effusion. 2. Revisualization of malignancy involving the anterior mediastinum and extending along the RIGHT perihilar border. Electronically Signed   By: Valentino Saxon MD   On: 11/09/2020 11:50   DG Chest 2 View  Result Date: 10/28/2020 CLINICAL DATA:  Shortness of breath EXAM: CHEST - 2 VIEW COMPARISON:  Chest radiograph and chest CT October 23, 2020 FINDINGS: Right pleural effusion, increased in size. Airspace opacity/consolidation with obstruction of the right lower lobe bronchus; CT shows large mass in this area. Left lung is clear. Heart is upper normal in size. Pulmonary vascularity on the left is normal. Pulmonary vascularity on the right appears grossly normal, although there is soft tissue  fullness in this area due to adenopathy which is evident on recent CT. No appreciable bone lesions. IMPRESSION: Pleural effusion on the right has increased in size. Extensive mass with consolidation throughout portions of the right middle lobe region with mass arising from the right mediastinum and potentially pericardium. There is right hilar adenopathy, better delineated by CT. Left lung is clear. Stable cardiac silhouette. Electronically Signed   By: Lowella Grip III M.D.   On: 10/28/2020 11:31   DG Chest 2 View  Result Date: 10/23/2020 CLINICAL DATA:  55 year old female with increasing shortness of breath for 2 days. EXAM: CHEST - 2 VIEW COMPARISON:  None. FINDINGS: Moderate  size layering right pleural effusion with dense right lung base opacification. No superimposed pulmonary edema or pneumothorax. The left lung appears to remain clear. Possible cardiomegaly. Right hilum and right heart border are obscured. Furthermore, possible tapered appearance of the right bronchus intermedius. Visualized tracheal air column is within normal limits. No acute osseous abnormality identified. Negative visible bowel gas pattern. Surgical clips in the upper abdomen. IMPRESSION: 1. Moderate size right pleural effusion. Underlying mass or pneumonia not excluded. 2. Questionable cardiomegaly.  No overt edema. Electronically Signed   By: Genevie Ann M.D.   On: 10/23/2020 08:51   CT Angio Chest PE W/Cm &/Or Wo Cm  Result Date: 10/23/2020 CLINICAL DATA:  55 year old female with increasing shortness of breath and abnormal chest x-ray this morning. EXAM: CT ANGIOGRAPHY CHEST WITH CONTRAST TECHNIQUE: Multidetector CT imaging of the chest was performed using the standard protocol during bolus administration of intravenous contrast. Multiplanar CT image reconstructions and MIPs were obtained to evaluate the vascular anatomy. CONTRAST:  38m OMNIPAQUE IOHEXOL 350 MG/ML SOLN COMPARISON:  Chest radiographs 10/23/2020. FINDINGS: Cardiovascular: Adequate contrast bolus timing in the pulmonary arterial tree. Respiratory motion degrades detail of the distal left lower lobe pulmonary arteries. Elsewhere no focal filling defect identified in the pulmonary arteries to suggest acute pulmonary embolism. However, there is a large mediastinal mass which severely narrows the right middle and upper lobe pulmonary artery branches (series 6, image 121). The mass infiltrates the superior mediastinum and is also inseparable from the ascending aorta, some of the proximal great vessels, cardiac base and right heart border. No pericardial effusion. No cardiomegaly. Aorta and proximal great vessels remain patent without irregularity. The  mass also severely attenuates the right pulmonary veins. Mediastinum/Nodes: Large, infiltrative mediastinal mass occupying the superior and anterior mediastinum tracking inferiorly on the right. The mass encompasses 9.9 x 14.1 x 14.2 cm (AP by transverse by CC). See series 5, image 51 and coronal series 10, image 30. Slightly Discontinuous ex-nodal disease is noted about the disease great vessels. There is no lymphadenopathy in the visible lower neck or in the axilla. Associated regional mass effect, including on the right airways, see below. Lungs/Pleura: Moderate to large mixed subpleural and layering right pleural effusion with simple fluid density. Compressive atelectasis of virtually the entire right lower lobe. Atelectasis of the right middle lobe is related both to pleural fluid and the large mediastinal mass. There is irregular narrowing of the bronchus intermedius (series 7, image 42) and the other right hilar airways are also encased by tumor. Major airways do remain patent. In the left lung 2 indeterminate although somewhat sub solid appearing pulmonary nodules are identified in the peripheral upper lobe (series 7, image 28, 4-5 mm) and lower lobe just above the diaphragm (6-7 mm image 71). Trace left pleural fluid. Upper Abdomen: Absent gallbladder. Visible liver, spleen,  pancreas, adrenal glands, kidneys and bowel remain within normal limits. Musculoskeletal: No sternal or rib destruction related to the mass. No acute or suspicious osseous lesion is identified. Review of the MIP images confirms the above findings. IMPRESSION: 1. 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 Discontinuous ex-nodal disease in the superior prevascular space. Top differential considerations include small cell carcinoma, lymphoma, and non-small cell bronchogenic carcinoma. 2. Superimposed  moderate to large layering and sub-pulmonic right pleural effusion associated right lung atelectasis. 3. Two small indeterminate left lung pulmonary nodules. 4. Negative for acute pulmonary embolus. No metastatic disease identified in the upper abdomen. Electronically Signed   By: Genevie Ann M.D.   On: 10/23/2020 11:30   MR Brain W Wo Contrast  Result Date: 10/30/2020 CLINICAL DATA:  Staging of recently diagnosed lung cancer. EXAM: MRI HEAD WITHOUT AND WITH CONTRAST TECHNIQUE: Multiplanar, multiecho pulse sequences of the brain and surrounding structures were obtained without and with intravenous contrast. CONTRAST:  103m GADAVIST GADOBUTROL 1 MMOL/ML IV SOLN COMPARISON:  None. FINDINGS: Brain: There is no evidence of an acute infarct, intracranial hemorrhage, mass, midline shift, or extra-axial fluid collection. The ventricles and sulci are normal. A few punctate foci of T2 hyperintensity in the cerebral white matter are nonspecific and considered to be within normal limits for age. No abnormal enhancement is identified. Vascular: Major intracranial vascular flow voids are preserved. Skull and upper cervical spine: Unremarkable bone marrow signal. Sinuses/Orbits: Unremarkable orbits. Clear paranasal sinuses. Small bilateral mastoid effusions. Other: None. IMPRESSION: Unremarkable appearance of the brain for age. No evidence of intracranial metastases. Electronically Signed   By: ALogan BoresM.D.   On: 10/30/2020 08:17   PERIPHERAL VASCULAR CATHETERIZATION  Result Date: 11/18/2020 See op note  NM PET Image Initial (PI) Skull Base To Thigh  Result Date: 10/30/2020 CLINICAL DATA:  Initial treatment strategy for mediastinal mass. EXAM: NUCLEAR MEDICINE PET SKULL BASE TO THIGH TECHNIQUE: 11.4 mCi F-18 FDG was injected intravenously. Full-ring PET imaging was performed from the skull base to thigh after the radiotracer. CT data was obtained and used for attenuation correction and anatomic localization. Fasting  blood glucose: 72 mg/dl COMPARISON:  CT scan 10/30/2020 FINDINGS: Mediastinal blood pool activity: SUV max 1.97 Liver activity: SUV max NA NECK: No neck mass or adenopathy. Incidental CT findings: none CHEST: Hypermetabolic soft tissue mass noted in the left upper chest wall area with ill-defined soft tissue density which could be a nodal mass measures a maximum of 14 mm and the SUV max is 19.07. Small subclavicular lymph node measures 8 mm on image 63/3 and has an SUV max of 3.37. No breast masses, axillary or subpectoral adenopathy. Large anterior mediastinal mass also invading the middle mediastinum on the right side. The lesion is markedly hypermetabolic with SUV max of 331.51 There is an area of chest wall invasion along the left side of the sternum. There is also a separate pleural nodule on the left side measuring 10 mm with an SUV max of 4.20. A small pleural nodule is also noted posterior to the descending thoracic aorta on image number 74/3. It measures 10 mm and the SUV max is 6.21. Moderate-sized right pleural effusion is noted but no definite hypermetabolic right-sided pleural nodules. A few small indeterminate pulmonary nodules are also noted. Incidental CT findings: none ABDOMEN/PELVIS: No hypermetabolic solid abdominal organ lesions and no hypermetabolic lymphadenopathy. No inguinal adenopathy.  Incidental CT findings: none SKELETON: No osseous lesions are identified. Incidental CT findings: none IMPRESSION: 1. Large markedly hypermetabolic anterior mediastinal mass also involving the middle mediastinum on the right side. Findings suspicious for thymic neoplasm. Lymphoma and germ cell tumors would be other possibilities. The lesion appears to invade the left chest wall along the lateral margin of the sternum. 2. Associated small hypermetabolic pleural nodules on the left side. 3. 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 4. A few small indeterminate pulmonary nodules are noted. 5. Hypermetabolic soft  tissue mass in the left chest wall anterior to the left first rib could represent adenopathy. 6. No findings for abdominal/pelvic metastatic disease or osseous metastatic disease. Electronically Signed   By: Marijo Sanes M.D.   On: 10/30/2020 17:06   US Venous Img Upper Uni Right  Result Date: 11/20/2020 CLINICAL DATA:  Right upper extremity edema.  Evaluate for DVT. EXAM: RIGHT UPPER EXTREMITY VENOUS DOPPLER ULTRASOUND TECHNIQUE: Gray-scale sonography with graded compression, as well as color Doppler and duplex ultrasound were performed to evaluate the upper extremity deep venous system from the level of the subclavian vein and including the jugular, axillary, basilic, radial, ulnar and upper cephalic vein. Spectral Doppler was utilized to evaluate flow at rest and with distal augmentation maneuvers. COMPARISON:  None. FINDINGS: Contralateral Subclavian Vein: Respiratory phasicity is normal and symmetric with the symptomatic side. No evidence of thrombus. Normal compressibility. Internal Jugular Vein: No evidence of thrombus. Normal compressibility, respiratory phasicity and response to augmentation. Subclavian Vein: No evidence of thrombus. Normal compressibility, respiratory phasicity and response to augmentation. Axillary Vein: No evidence of thrombus. Normal compressibility, respiratory phasicity and response to augmentation. Cephalic Vein: No evidence of thrombus. Normal compressibility, respiratory phasicity and response to augmentation. Basilic Vein: No evidence of thrombus. Normal compressibility, respiratory phasicity and response to augmentation. Brachial Veins: There is hypoechoic occlusive thrombus involving one of the paired brachial veins (images 21 and 26). The adjacent paired brachial vein appears widely patent. Radial Veins: No evidence of thrombus. Normal compressibility, respiratory phasicity and response to augmentation. Ulnar Veins: No evidence of thrombus. Normal compressibility,  respiratory phasicity and response to augmentation. Other Findings:  None visualized. IMPRESSION: Examination is positive for occlusive DVT involving one of the paired right brachial veins. There is no extension of this short-segment occlusive DVT to the more proximal venous system of the right upper extremity Electronically Signed   By: Sandi Mariscal M.D.   On: 11/20/2020 11:13   CT BIOPSY  Result Date: 11/04/2020 INDICATION: 55 year old with a large mediastinal mass. Tissue diagnosis is needed. EXAM: CT-GUIDED CORE BIOPSY OF MEDIASTINAL MASS MEDICATIONS: None. ANESTHESIA/SEDATION: Moderate (conscious) sedation was employed during this procedure. A total of Versed 2.0 mg and Fentanyl 100 mcg was administered intravenously. Moderate Sedation Time: 16 minutes. The patient's level of consciousness and vital signs were monitored continuously by radiology nursing throughout the procedure under my direct supervision. FLUOROSCOPY TIME:  Fluoroscopy Time: None COMPLICATIONS: None immediate. PROCEDURE: Informed written consent was obtained from the patient after a thorough discussion of the procedural risks, benefits and alternatives. All questions were addressed. A timeout was performed prior to the initiation of the procedure. Patient was placed supine on the CT scanner. CT images through the chest were obtained. The anterior chest was prepped with chlorhexidine and sterile field was created. Skin and soft tissues were anesthetized with 1% lidocaine. Using CT guidance, a 17 gauge coaxial needle was directed into the anterior mediastinal mass along the left side of the sternum. Needle  was directed into the mediastinal mass and needle position was confirmed with CT. Three core biopsies were obtained with an 18 gauge core device. Specimens were placed on Telfa pad with saline. Needle was removed without complication and follow up CT images were obtained. Bandage placed over the puncture site. FINDINGS: Again noted is a  large anterior mediastinal mass with low-density along the right side of the mass. Biopsy needle was directed into the mass along the left side of the sternum. Needle position confirmed within the mediastinal mass. No significant bleeding or hematoma formation at the end of the procedure. Small loculated right pleural effusion. Tiny focus of gas within the right pleural effusion related to recent thoracentesis. No significant pleural gas. Again noted is a small nodule in the left upper lobe measuring close to 6 mm. IMPRESSION: CT-guided core biopsy of the anterior mediastinal mass. Electronically Signed   By: Markus Daft M.D.   On: 11/04/2020 15:34   DG Chest Port 1 View  Result Date: 11/04/2020 CLINICAL DATA:  Status post thoracentesis EXAM: PORTABLE CHEST 1 VIEW COMPARISON:  October 30, 2020. FINDINGS: No pneumothorax evident. Small right pleural effusion evident. There is mild right base atelectasis. The lungs elsewhere are clear. There is stable cardiomegaly with pulmonary vascularity normal. No adenopathy. No bone lesions. IMPRESSION: No appreciable pneumothorax. Small right pleural effusion with right base atelectasis. No consolidation. Stable cardiomegaly. Electronically Signed   By: Lowella Grip III M.D.   On: 11/04/2020 15:15   DG Chest Port 1 View  Result Date: 10/30/2020 CLINICAL DATA:  Status post thoracentesis. EXAM: PORTABLE CHEST 1 VIEW COMPARISON:  10/28/2020 FINDINGS: Interval near complete evacuation of the right pleural fluid collection. No postprocedural pneumothorax is identified. Streaky right basilar atelectasis. IMPRESSION: Near complete evacuation of right pleural fluid collection without postprocedural pneumothorax. Electronically Signed   By: Marijo Sanes M.D.   On: 10/30/2020 13:04   DG Chest Port 1 View  Result Date: 10/23/2020 CLINICAL DATA:  Post thoracentesis EXAM: PORTABLE CHEST 1 VIEW COMPARISON:  CT 10/23/2020 FINDINGS: Interval decrease in the size of the right  pleural effusion with some small to moderate volume residual layering fluid in the right lung base. Suspect at least trace left effusion. Residual atelectatic changes are noted in both lungs. Redemonstration of the irregular heterogeneous mediastinal contours compatible with the infiltrative mediastinal mass seen on comparison CT imaging. Previously seen pulmonary nodules are less well visualized radiographically. No visible pneumothorax. No acute osseous or soft tissue abnormality. Telemetry leads overlie the chest. IMPRESSION: 1. Interval decrease in the size of the right pleural effusion with some small to moderate volume residual layering fluid in the right lung base. No pneumothorax. Suspect at least trace left effusion. 2. Irregular mediastinal margins compatible with the infiltrative mass seen on comparison CT. Electronically Signed   By: Lovena Le M.D.   On: 10/23/2020 16:11   CT BONE MARROW BIOPSY & ASPIRATION  Result Date: 11/18/2020 INDICATION: Recent diagnosis of B-cell lymphoma. Please perform CT-guided bone marrow biopsy for tissue diagnostic purposes. EXAM: CT-GUIDED BONE MARROW BIOPSY AND ASPIRATION MEDICATIONS: None ANESTHESIA/SEDATION: Fentanyl 100 mcg IV; Versed 3 mg IV Sedation Time: 12 Minutes; The patient was continuously monitored during the procedure by the interventional radiology nurse under my direct supervision. COMPLICATIONS: None immediate. PROCEDURE: Informed consent was obtained from the patient following an explanation of the procedure, risks, benefits and alternatives. The patient understands, agrees and consents for the procedure. All questions were addressed. A time out was performed prior  to the initiation of the procedure. The patient was positioned left lateral decubitus and non-contrast localization CT was performed of the pelvis to demonstrate the iliac marrow spaces. The operative site was prepped and draped in the usual sterile fashion. Under sterile conditions and  local anesthesia, a 22 gauge spinal needle was utilized for procedural planning. Next, an 11 gauge coaxial bone biopsy needle was advanced into the left iliac marrow space. Needle position was confirmed with CT imaging. Initially, a bone marrow aspiration was performed. Next, a bone marrow biopsy was obtained with the 11 gauge outer bone marrow device. The 11 gauge coaxial bone biopsy needle was re-advanced into a slightly different location within the left iliac marrow space, positioning was confirmed with CT imaging and an additional bone marrow biopsy was obtained. The needle was removed and superficial hemostasis was obtained with manual compression. A dressing was applied. The patient tolerated the procedure well without immediate post procedural complication. IMPRESSION: Successful CT guided left iliac bone marrow aspiration and core biopsy. Electronically Signed   By: Sandi Mariscal M.D.   On: 11/18/2020 11:18   ECHOCARDIOGRAM COMPLETE  Result Date: 11/19/2020    ECHOCARDIOGRAM REPORT   Patient Name:   JASIEL APACHITO Date of Exam: 11/19/2020 Medical Rec #:  [id number]        Height:       66.0 in Accession #:    [id number]       Weight:       214.0 lb Date of Birth:  11/02/1965        BSA:          2.058 m Patient Age:    33 years         BP:           99/64 mmHg Patient Gender: F                HR:           113 bpm. Exam Location:  [facility] Procedure: 2D Echo, Cardiac Doppler, Color Doppler, Strain Analysis and            Intracardiac Opacification Agent Indications:     Chemo Z09  History:         Patient has no prior history of Echocardiogram examinations.                  Cancer pt. malignant pleural eff.  Sonographer:     Sherrie Sport RDCS (AE) Referring Phys:  [place] Diagnosing Phys: Yolonda Kida MD  Sonographer Comments: Suboptimal parasternal window. Global longitudinal strain was attempted. IMPRESSIONS  1. Left ventricular ejection fraction, by estimation, is 55 to 60%. The left  ventricle has normal function. The left ventricle has no regional wall motion abnormalities. Left ventricular diastolic parameters were normal.  2. Right ventricular systolic function is normal. The right ventricular size is normal.  3. The mitral valve is normal in structure. No evidence of mitral valve regurgitation.  4. The aortic valve is normal in structure. Aortic valve regurgitation is not visualized. Conclusion(s)/Recommendation(s): Poor windows for evaluation of left ventricular function by transthoracic echocardiography. Would recommend an alternative means of evaluation. FINDINGS  Left Ventricle: Left ventricular ejection fraction, by estimation, is 55 to 60%. The left ventricle has normal function. The left ventricle has no regional wall motion abnormalities. Definity contrast agent was given IV to delineate the left ventricular  endocardial borders. The left ventricular internal cavity size was normal in size. There  is no left ventricular hypertrophy. Left ventricular diastolic parameters were normal. Right Ventricle: The right ventricular size is normal. No increase in right ventricular wall thickness. Right ventricular systolic function is normal. Left Atrium: Left atrial size was normal in size. Right Atrium: Right atrial size was normal in size. Pericardium: Trivial pericardial effusion is present. Mitral Valve: The mitral valve is normal in structure. No evidence of mitral valve regurgitation. Tricuspid Valve: The tricuspid valve is normal in structure. Tricuspid valve regurgitation is trivial. Aortic Valve: The aortic valve is normal in structure. Aortic valve regurgitation is not visualized. Aortic valve mean gradient measures 3.5 mmHg. Aortic valve peak gradient measures 6.4 mmHg. Aortic valve area, by VTI measures 2.91 cm. Pulmonic Valve: The pulmonic valve was normal in structure. Pulmonic valve regurgitation is not visualized. Aorta: The ascending aorta was not well visualized. IAS/Shunts:  No atrial level shunt detected by color flow Doppler.  LEFT VENTRICLE PLAX 2D LVIDd:         3.59 cm  Diastology LVIDs:         2.58 cm  LV e' medial:    11.50 cm/s LV PW:         1.25 cm  LV E/e' medial:  8.0 LV IVS:        0.77 cm  LV e' lateral:   10.30 cm/s LVOT diam:     2.20 cm  LV E/e' lateral: 8.9 LV SV:         52 LV SV Index:   25 LVOT Area:     3.80 cm  RIGHT VENTRICLE RV Basal diam:  2.42 cm RV S prime:     15.60 cm/s TAPSE (M-mode): 4.0 cm LEFT ATRIUM           Index       RIGHT ATRIUM          Index LA diam:      2.40 cm 1.17 cm/m  RA Area:     7.95 cm LA Vol (A2C): 16.0 ml 7.77 ml/m  RA Volume:   13.00 ml 6.32 ml/m LA Vol (A4C): 32.7 ml 15.89 ml/m  AORTIC VALVE AV Area (Vmax):    3.08 cm AV Area (Vmean):   2.87 cm AV Area (VTI):     2.91 cm AV Vmax:           126.00 cm/s AV Vmean:          85.100 cm/s AV VTI:            0.180 m AV Peak Grad:      6.4 mmHg AV Mean Grad:      3.5 mmHg LVOT Vmax:         102.00 cm/s LVOT Vmean:        64.300 cm/s LVOT VTI:          0.138 m LVOT/AV VTI ratio: 0.76  AORTA Ao Root diam: 3.35 cm MITRAL VALVE               TRICUSPID VALVE MV Area (PHT): 4.49 cm    TR Peak grad:   16.6 mmHg MV Decel Time: 169 msec    TR Vmax:        204.00 cm/s MV E velocity: 91.70 cm/s MV A velocity: 61.30 cm/s  SHUNTS MV E/A ratio:  1.50        Systemic VTI:  0.14 m  Systemic Diam: 2.20 cm Yolonda Kida MD Electronically signed by Yolonda Kida MD Signature Date/Time: 11/19/2020/5:59:27 PM    Final    IR PERC PLEURAL DRAIN W/INDWELL CATH W/IMG GUIDE  Result Date: 11/11/2020 CLINICAL DATA:  55 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 EXAM: INSERTION OF TUNNELED right SIDED PLEURAL DRAINAGE CATHETER COMPARISON:  11/09/2020 MEDICATIONS: Ancef 2 gm IV; Antibiotic was administered in an appropriate time interval for the procedure. ANESTHESIA/SEDATION: Moderate (conscious) sedation was employed during this procedure. A total of Versed 3  mg and Fentanyl 150 mcg was administered intravenously. Moderate Sedation Time: 34 minutes. The patient's level of consciousness and vital signs were monitored continuously by radiology nursing throughout the procedure under my direct supervision. FLUOROSCOPY TIME:  FLUOROSCOPY TIME 0.4 min (8.6 mGy) COMPLICATIONS: None immediate. PROCEDURE: The procedure, risks, benefits, and alternatives were explained to the patient, who wishes to proceed with the placement of this permanent pleural catheter as they are seeking palliative care. The patient understands and consents to the procedure. The right lateral chest and upper abdomen were prepped and draped in a sterile fashion, and a sterile drape was applied covering the operative field. A sterile gown and sterile gloves were used for the procedure. Initial ultrasound scanning and fluoroscopic imaging demonstrates a recurrent moderate to large pleural effusion. Under direct ultrasound guidance, the inferior lateral pleural space was accessed with a Yueh sheath needle after the overlying soft tissues were anesthetized with 1% lidocaine with epinephrine. A Rosen wire was then advanced under fluoroscopy into the pleural space. A 15.5 French tunneled Pleur-X catheter was tunneled from an incision in the right upper abdominal quadrant to the access site. The pleural access site was serially dilated under fluoroscopy, ultimately allowing placement of a peel-away sheath. The catheter was advanced through the peel-away sheath. The sheath was then removed. Final catheter positioning was confirmed with a fluoroscopic radiographic image. The access incision was closed with Dermabond. A 0 silk retention suture was applied at the catheter exit site. Thoracentesis was performed through the new catheter utilizing provided bulb vacuum assisted drainage bag. Dressings were applied. The patient tolerated the above procedure well without immediate postprocedural complication. FINDINGS:  Preprocedural ultrasound scanning demonstrates a recurrent moderate sized right sided pleural effusion. After ultrasound and fluoroscopic guided placement, the catheter is directed superiorly Following catheter placement, approximately 50 cc of translucent, straw-colored pleural fluid was removed. IMPRESSION: Successful placement of permanent, tunneled right pleural drainage catheter via lateral approach. 50 mL of translucent, straw-colored pleural fluid was removed following catheter placement. Ruthann Cancer, MD [facility] Radiology Electronically Signed   By: Ruthann Cancer MD   On: 11/11/2020 15:00   US THORACENTESIS ASP PLEURAL SPACE W/IMG GUIDE  Result Date: 11/04/2020 INDICATION: Mediastinal mass. Shortness of breath. Recurrent right pleural effusion. Request for therapeutic thoracentesis. EXAM: ULTRASOUND GUIDED RIGHT THORACENTESIS MEDICATIONS: 1% plain lidocaine, 10 mL COMPLICATIONS: None immediate. PROCEDURE: An ultrasound guided thoracentesis was thoroughly discussed with the patient and questions answered. The benefits, risks, alternatives and complications were also discussed. The patient understands and wishes to proceed with the procedure. Written consent was obtained. Ultrasound was performed to localize and mark an adequate pocket of fluid in the right chest. The area was then prepped and draped in the normal sterile fashion. 1% Lidocaine was used for local anesthesia. Under ultrasound guidance a 6 Fr Safe-T-Centesis catheter was introduced. Thoracentesis was performed. The catheter was removed and a dressing applied. FINDINGS: A total of approximately 900 mL  of clear yellow fluid was removed. Ultrasound of the right chest demonstrates right pleural effusion with interval development of loculations. IMPRESSION: Successful ultrasound guided right thoracentesis yielding 900 mL of pleural fluid. Read by: Ascencion Dike PA-C Electronically Signed   By:  Markus Daft M.D.   On: 11/04/2020 12:52   US THORACENTESIS ASP PLEURAL SPACE W/IMG GUIDE  Result Date: 10/30/2020 INDICATION: 56 year old with mediastinal mass and recurrent right pleural effusion. EXAM: ULTRASOUND GUIDED RIGHT THORACENTESIS MEDICATIONS: None. COMPLICATIONS: None immediate. PROCEDURE: An ultrasound guided thoracentesis was thoroughly discussed with the patient and questions answered. The benefits, risks, alternatives and complications were also discussed. The patient understands and wishes to proceed with the procedure. Written consent was obtained. Ultrasound was performed to localize and mark an adequate pocket of fluid in the right chest. The area was then prepped and draped in the normal sterile fashion. 1% Lidocaine was used for local anesthesia. Under ultrasound guidance a 6 Fr Safe-T-Centesis catheter was introduced. Thoracentesis was performed. The catheter was removed and a dressing applied. FINDINGS: A total of approximately 1.3 L of yellow fluid was removed. IMPRESSION: Successful ultrasound guided right thoracentesis yielding 1.3 L of pleural fluid. Electronically Signed   By: Markus Daft M.D.   On: 10/30/2020 13:29   US THORACENTESIS ASP PLEURAL SPACE W/IMG GUIDE  Result Date: 10/23/2020 INDICATION: RIGHT PLEURAL EFFUSION EXAM: ULTRASOUND GUIDED RIGHT THORACENTESIS MEDICATIONS: 1% LIDOCAINE LOCAL COMPLICATIONS: None immediate. PROCEDURE: An ultrasound guided thoracentesis was thoroughly discussed with the patient and questions answered. The benefits, risks, alternatives and complications were also discussed. The patient understands and wishes to proceed with the procedure. Written consent was obtained. Ultrasound was performed to localize and mark an adequate pocket of fluid in the right chest. The area was then prepped and draped in the normal sterile fashion. 1% Lidocaine was used for local anesthesia. Under ultrasound guidance a 6 Fr Safe-T-Centesis catheter was introduced.  Thoracentesis was performed. The catheter was removed and a dressing applied. FINDINGS: A total of approximately 1.1 L of CLEAR PLEURAL fluid was removed. Samples were sent to the laboratory as requested by the clinical team. IMPRESSION: Successful ultrasound guided right thoracentesis yielding 1.1 L of pleural fluid. Electronically Signed   By: Jerilynn Mages.  Shick M.D.   On: 10/23/2020 15:59      Subjective: Pt feeling okay today.  Breathing is better, but feels short of breathing with exertion.  Awaiting biopsy and then wants to go home today.   Discharge Exam: Vitals:   11/11/20 1208 11/11/20 1542  BP: 95/75 95/70  Pulse: 94 96  Resp: 18 18  Temp: 97.8 F (36.6 C) 98 F (36.7 C)  SpO2: 96% 100%   Vitals:   11/11/20 0400 11/11/20 0748 11/11/20 1208 11/11/20 1542  BP: 106/76 1'03/75 95/75 95/70 '  Pulse: 96 97 94 96  Resp: '20 18 18 18  ' Temp: 98.8 F (37.1 C) 98.3 F (36.8 C) 97.8 F (36.6 C) 98 F (36.7 C)  TempSrc: Oral     SpO2: 96% 93% 96% 100%  Weight:      Height:        General: Pt is alert, awake, not in acute distress Cardiovascular: RRR, S1/S2 +, no rubs, no gallops Respiratory: CTA bilaterally, no wheezing, no rhonchi Abdominal: Soft, NT, ND, bowel sounds + Extremities: no edema, no cyanosis    The results of significant diagnostics from this hospitalization (including imaging, microbiology, ancillary and laboratory) are listed below for reference.     Microbiology: Recent Results (from  the past 240 hour(s))  SARS CORONAVIRUS 2 (TAT 6-24 HRS) Nasopharyngeal Nasopharyngeal Swab     Status: None   Collection Time: 11/14/20 12:52 PM   Specimen: Nasopharyngeal Swab  Result Value Ref Range Status   SARS Coronavirus 2 NEGATIVE NEGATIVE Final    Comment: (NOTE) SARS-CoV-2 target nucleic acids are NOT DETECTED.  The SARS-CoV-2 RNA is generally detectable in upper and lower respiratory specimens during the acute phase of infection. Negative results do not preclude  SARS-CoV-2 infection, do not rule out co-infections with other pathogens, and should not be used as the sole basis for treatment or other patient management decisions. Negative results must be combined with clinical observations, patient history, and epidemiological information. The expected result is Negative.  Fact Sheet for Patients: SugarRoll.be  Fact Sheet for Healthcare Providers: https://www.woods-mathews.com/  This test is not yet approved or cleared by the [place] FDA and  has been authorized for detection and/or diagnosis of SARS-CoV-2 by FDA under an Emergency Use Authorization (EUA). This EUA will remain  in effect (meaning this test can be used) for the duration of the COVID-19 declaration under Se ction 564(b)(1) of the Act, 21 U.S.C. section 360bbb-3(b)(1), unless the authorization is terminated or revoked sooner.  Performed at [facility], [place] [address], [place], [place] [address]      Labs: BNP (last 3 results) Recent Labs    10/23/20 0752  BNP 91.4   Basic Metabolic Panel: Recent Labs  Lab 11/20/20 0755  NA 135  K 4.1  CL 97*  CO2 27  GLUCOSE 93  BUN 15  CREATININE 0.52  CALCIUM 9.2   Liver Function Tests: Recent Labs  Lab 11/20/20 0755  AST 27  ALT 12  ALKPHOS 69  BILITOT 0.9  PROT 7.3  ALBUMIN 3.5   No results for input(s): LIPASE, AMYLASE in the last 168 hours. No results for input(s): AMMONIA in the last 168 hours. CBC: Recent Labs  Lab 11/18/20 0912 11/20/20 0755  WBC 6.1 5.9  NEUTROABS 4.5 4.5  HGB 11.9* 12.4  HCT 36.8 38.0  MCV 88.5 88.8  PLT 309 279   Cardiac Enzymes: No results for input(s): CKTOTAL, CKMB, CKMBINDEX, TROPONINI in the last 168 hours. BNP: Invalid input(s): POCBNP CBG: No results for input(s): GLUCAP in the last 168 hours. D-Dimer No results for input(s): DDIMER in the last 72 hours. Hgb A1c No results for input(s): HGBA1C in the last 72  hours. Lipid Profile No results for input(s): CHOL, HDL, LDLCALC, TRIG, CHOLHDL, LDLDIRECT in the last 72 hours. Thyroid function studies No results for input(s): TSH, T4TOTAL, T3FREE, THYROIDAB in the last 72 hours.  Invalid input(s): FREET3 Anemia work up No results for input(s): VITAMINB12, FOLATE, FERRITIN, TIBC, IRON, RETICCTPCT in the last 72 hours. Urinalysis    Component Value Date/Time   COLORURINE YELLOW 03/24/2007 0010   APPEARANCEUR CLOUDY (A) 03/24/2007 0010   LABSPEC 1.011 03/24/2007 0010   PHURINE 6.0 03/24/2007 0010   GLUCOSEU NEGATIVE 03/24/2007 0010   HGBUR NEGATIVE 03/24/2007 0010   BILIRUBINUR NEGATIVE 03/24/2007 0010   KETONESUR NEGATIVE 03/24/2007 0010   PROTEINUR NEGATIVE 03/24/2007 0010   UROBILINOGEN 0.2 03/24/2007 0010   NITRITE POSITIVE (A) 03/24/2007 0010   LEUKOCYTESUR SMALL (A) 03/24/2007 0010   Sepsis Labs Invalid input(s): PROCALCITONIN,  WBC,  LACTICIDVEN Microbiology Recent Results (from the past 240 hour(s))  SARS CORONAVIRUS 2 (TAT 6-24 HRS) Nasopharyngeal Nasopharyngeal Swab     Status: None   Collection Time: 11/14/20 12:52  PM   Specimen: Nasopharyngeal Swab  Result Value Ref Range Status   SARS Coronavirus 2 NEGATIVE NEGATIVE Final    Comment: (NOTE) SARS-CoV-2 target nucleic acids are NOT DETECTED.  The SARS-CoV-2 RNA is generally detectable in upper and lower respiratory specimens during the acute phase of infection. Negative results do not preclude SARS-CoV-2 infection, do not rule out co-infections with other pathogens, and should not be used as the sole basis for treatment or other patient management decisions. Negative results must be combined with clinical observations, patient history, and epidemiological information. The expected result is Negative.  Fact Sheet for Patients: SugarRoll.be  Fact Sheet for Healthcare Providers: https://www.woods-mathews.com/  This test is not yet  approved or cleared by the [place] FDA and  has been authorized for detection and/or diagnosis of SARS-CoV-2 by FDA under an Emergency Use Authorization (EUA). This EUA will remain  in effect (meaning this test can be used) for the duration of the COVID-19 declaration under Se ction 564(b)(1) of the Act, 21 U.S.C. section 360bbb-3(b)(1), unless the authorization is terminated or revoked sooner.  Performed at [facility], [place] [address], [place], [place] [address]      Time coordinating discharge: Over 30 minutes  SIGNED:   Ezekiel Slocumb, DO [facility] 11/20/2020, 8:30 PM   If 7PM-7AM, please contact night-coverage [email or web address]

## 2020-11-12 ENCOUNTER — Other Ambulatory Visit: Payer: Self-pay | Admitting: *Deleted

## 2020-11-12 ENCOUNTER — Encounter: Payer: Self-pay | Admitting: Oncology

## 2020-11-12 ENCOUNTER — Inpatient Hospital Stay: Payer: 59 | Attending: Oncology | Admitting: Oncology

## 2020-11-12 ENCOUNTER — Encounter: Payer: Self-pay | Admitting: *Deleted

## 2020-11-12 ENCOUNTER — Ambulatory Visit: Payer: 59

## 2020-11-12 VITALS — BP 97/71 | HR 96 | Temp 99.0°F | Resp 20 | Wt 217.8 lb

## 2020-11-12 DIAGNOSIS — J90 Pleural effusion, not elsewhere classified: Secondary | ICD-10-CM | POA: Diagnosis not present

## 2020-11-12 DIAGNOSIS — C8520 Mediastinal (thymic) large B-cell lymphoma, unspecified site: Secondary | ICD-10-CM | POA: Insufficient documentation

## 2020-11-12 DIAGNOSIS — R918 Other nonspecific abnormal finding of lung field: Secondary | ICD-10-CM

## 2020-11-12 DIAGNOSIS — Z5112 Encounter for antineoplastic immunotherapy: Secondary | ICD-10-CM | POA: Insufficient documentation

## 2020-11-12 DIAGNOSIS — Z5111 Encounter for antineoplastic chemotherapy: Secondary | ICD-10-CM | POA: Diagnosis present

## 2020-11-12 DIAGNOSIS — J9859 Other diseases of mediastinum, not elsewhere classified: Secondary | ICD-10-CM

## 2020-11-12 NOTE — Progress Notes (Unsigned)
Patient here for oncology follow-up appointment, expresses concerns of surgical pain and shortness of breath

## 2020-11-12 NOTE — Progress Notes (Signed)
  Oncology Nurse Navigator Documentation  Navigator Location: [facility] (11/12/20 1300)   )Navigator Encounter Type: Follow-up Appt (11/12/20 1300)     Confirmed Diagnosis Date: 11/11/20 (11/12/20 1300)               Patient Visit Type: MedOnc (11/12/20 1300) Treatment Phase: Pre-Tx/Tx Discussion (11/12/20 1300) Barriers/Navigation Needs: Coordination of Care;Pain (11/12/20 1300)   Interventions: Coordination of Care (11/12/20 1300)   Coordination of Care: Appts;Chemo (11/12/20 1300)      Specialty Items/DME: Pleur X catheter (11/12/20 1300)      met with patient during follow up visit with Dr. Grayland Ormond to discuss pathology results and treatment options. All questions answered during visit. Reviewed care instructions regarding pleurx catheter and pt stated that her daughter can help with drainage. Reviewed upcoming appts. Instructed to call with any further questions or needs. Pt verbalized understanding.     Time Spent with Patient: 60 (11/12/20 1300)

## 2020-11-12 NOTE — Telephone Encounter (Signed)
Error

## 2020-11-13 ENCOUNTER — Other Ambulatory Visit: Payer: Self-pay | Admitting: Oncology

## 2020-11-13 ENCOUNTER — Other Ambulatory Visit: Payer: Self-pay | Admitting: *Deleted

## 2020-11-13 ENCOUNTER — Telehealth (INDEPENDENT_AMBULATORY_CARE_PROVIDER_SITE_OTHER): Payer: Self-pay

## 2020-11-13 DIAGNOSIS — C8520 Mediastinal (thymic) large B-cell lymphoma, unspecified site: Secondary | ICD-10-CM | POA: Insufficient documentation

## 2020-11-13 LAB — SURGICAL PATHOLOGY

## 2020-11-13 MED ORDER — ONDANSETRON HCL 8 MG PO TABS: 8.0000 mg | ORAL_TABLET | Freq: Two times a day (BID) | ORAL | 2 refills | Status: DC | PRN

## 2020-11-13 MED ORDER — PROCHLORPERAZINE MALEATE 10 MG PO TABS: 10.0000 mg | ORAL_TABLET | Freq: Four times a day (QID) | ORAL | 2 refills | Status: DC | PRN

## 2020-11-13 MED ORDER — PREDNISONE 20 MG PO TABS: 100.0000 mg | ORAL_TABLET | Freq: Every day | ORAL | 5 refills | Status: DC

## 2020-11-13 MED ORDER — ALLOPURINOL 300 MG PO TABS: 300.0000 mg | ORAL_TABLET | Freq: Every day | ORAL | 3 refills | Status: DC

## 2020-11-13 MED ORDER — LIDOCAINE-PRILOCAINE 2.5-2.5 % EX CREA: TOPICAL_CREAM | CUTANEOUS | 3 refills | Status: DC

## 2020-11-13 NOTE — Telephone Encounter (Signed)
Spoke with the patient regarding her port placement with Dr. Lucky Cowboy on 11/18/20 with a 11:15 am arrival to the [facility]. Covid testing on 11/14/20 between 8-1 pm at the [facility]. Pre-procedure instructions were discussed and mailed. Patient was advised that the mail may be slow.

## 2020-11-13 NOTE — Progress Notes (Signed)
START ON PATHWAY REGIMEN - Lymphoma and CLL     A cycle is every 21 days:     Prednisone      Rituximab-xxxx      Cyclophosphamide      Doxorubicin      Vincristine   **Always confirm dose/schedule in your pharmacy ordering system**  Patient Characteristics: Primary Mediastinal Large B-Cell Lymphoma, First Line Disease Type: Not Applicable Disease Type: Primary Mediastinal Large B-Cell Lymphoma Disease Type: Not Applicable Line of Therapy: First Line Intent of Therapy: Curative Intent, Discussed with Patient

## 2020-11-13 NOTE — Telephone Encounter (Signed)
Pt called to switch pharmacies and asked that her prescriptions that were sent to [facility] be sent to [facility] instead. Pharmacy updated and prescriptions sent to requested pharmacy.

## 2020-11-14 ENCOUNTER — Other Ambulatory Visit
Admission: RE | Admit: 2020-11-14 | Discharge: 2020-11-14 | Disposition: A | Discharge status: Home / Self Care | Payer: 59 | Source: Ambulatory Visit | Attending: Vascular Surgery | Admitting: Vascular Surgery

## 2020-11-14 ENCOUNTER — Other Ambulatory Visit: Payer: Self-pay

## 2020-11-14 DIAGNOSIS — Z01812 Encounter for preprocedural laboratory examination: Secondary | ICD-10-CM | POA: Diagnosis present

## 2020-11-14 DIAGNOSIS — Z20822 Contact with and (suspected) exposure to covid-19: Secondary | ICD-10-CM | POA: Diagnosis not present

## 2020-11-14 LAB — SARS CORONAVIRUS 2 (TAT 6-24 HRS): SARS Coronavirus 2: NEGATIVE

## 2020-11-14 NOTE — Progress Notes (Signed)
Patient on schedule for BMB per IR, thereafter Port placement per [facility]IR later am,11/18/2020. Called and spoke with patient on phone with instructions given. Made aware to be here @ 0830 , NPO after MN prior to procedure, and driver post procedures/discharge. Stated understanding.

## 2020-11-14 NOTE — Patient Instructions (Signed)
Rituximab Injection What is this medicine? RITUXIMAB (ri TUX i mab) is a monoclonal antibody. It is used to treat certain types of cancer like non-Hodgkin lymphoma and chronic lymphocytic leukemia. It is also used to treat rheumatoid arthritis, granulomatosis with polyangiitis, microscopic polyangiitis, and pemphigus vulgaris. This medicine may be used for other purposes; ask your health care provider or pharmacist if you have questions. COMMON BRAND NAME(S): RIABNI, Rituxan, RUXIENCE What should I tell my health care provider before I take this medicine? They need to know if you have any of these conditions:  chest pain  heart disease  infection especially a viral infection such as chickenpox, cold sores, hepatitis B, or herpes  immune system problems  irregular heartbeat or rhythm  kidney disease  low blood counts (white cells, platelets, or red cells)  lung disease  recent or upcoming vaccine  an unusual or allergic reaction to rituximab, other medicines, foods, dyes, or preservatives  pregnant or trying to get pregnant  breast-feeding How should I use this medicine? This medicine is injected into a vein. It is given by a health care provider in a hospital or clinic setting. A special MedGuide will be given to you before each treatment. Be sure to read this information carefully each time. Talk to your health care provider about the use of this medicine in children. While this drug may be prescribed for children as young as 2 years for selected conditions, precautions do apply. Overdosage: If you think you have taken too much of this medicine contact a poison control center or emergency room at once. NOTE: This medicine is only for you. Do not share this medicine with others. What if I miss a dose? Keep appointments for follow-up doses. It is important not to miss your dose. Call your health care provider if you are unable to keep an appointment. What may interact with this  medicine? Do not take this medicine with any of the following medicines:  live vaccines This medicine may also interact with the following medicines:  cisplatin This list may not describe all possible interactions. Give your health care provider a list of all the medicines, herbs, non-prescription drugs, or dietary supplements you use. Also tell them if you smoke, drink alcohol, or use illegal drugs. Some items may interact with your medicine. What should I watch for while using this medicine? Your condition will be monitored carefully while you are receiving this medicine. You may need blood work done while you are taking this medicine. This medicine can cause serious infusion reactions. To reduce the risk your health care provider may give you other medicines to take before receiving this one. Be sure to follow the directions from your health care provider. This medicine may increase your risk of getting an infection. Call your health care provider for advice if you get a fever, chills, sore throat, or other symptoms of a cold or flu. Do not treat yourself. Try to avoid being around people who are sick. Call your health care provider if you are around anyone with measles, chickenpox, or if you develop sores or blisters that do not heal properly. Avoid taking medicines that contain aspirin, acetaminophen, ibuprofen, naproxen, or ketoprofen unless instructed by your health care provider. These medicines may hide a fever. This medicine may cause serious skin reactions. They can happen weeks to months after starting the medicine. Contact your health care provider right away if you notice fevers or flu-like symptoms with a rash. The rash may be red  or purple and then turn into blisters or peeling of the skin. Or, you might notice a red rash with swelling of the face, lips or lymph nodes in your neck or under your arms. In some patients, this medicine may cause a serious brain infection that may cause  death. If you have any problems seeing, thinking, speaking, walking, or standing, tell your healthcare professional right away. If you cannot reach your healthcare professional, urgently seek other source of medical care. Do not become pregnant while taking this medicine or for at least 12 months after stopping it. Women should inform their health care provider if they wish to become pregnant or think they might be pregnant. There is potential for serious harm to an unborn child. Talk to your health care provider for more information. Women should use a reliable form of birth control while taking this medicine and for 12 months after stopping it. Do not breast-feed while taking this medicine or for at least 6 months after stopping it. What side effects may I notice from receiving this medicine? Side effects that you should report to your health care provider as soon as possible:  allergic reactions (skin rash, itching or hives; swelling of the face, lips, or tongue)  diarrhea  edema (sudden weight gain; swelling of the ankles, feet, hands or other unusual swelling; trouble breathing)  fast, irregular heartbeat  heart attack (trouble breathing; pain or tightness in the chest, neck, back or arms; unusually weak or tired)  infection (fever, chills, cough, sore throat, pain or trouble passing urine)  kidney injury (trouble passing urine or change in the amount of urine)  liver injury (dark yellow or brown urine; general ill feeling or flu-like symptoms; loss of appetite, right upper belly pain; unusually weak or tired, yellowing of the eyes or skin)  low blood pressure (dizziness; feeling faint or lightheaded, falls; unusually weak or tired)  low red blood cell counts (trouble breathing; feeling faint; lightheaded, falls; unusually weak or tired)  mouth sores  redness, blistering, peeling, or loosening of the skin, including inside the mouth  stomach pain  unusual bruising or  bleeding  wheezing (trouble breathing with loud or whistling sounds)  vomiting Side effects that usually do not require medical attention (report to your health care provider if they continue or are bothersome):  headache  joint pain  muscle cramps, pain  nausea This list may not describe all possible side effects. Call your doctor for medical advice about side effects. You may report side effects to FDA at 1-800-FDA-1088. Where should I keep my medicine? This medicine is given in a hospital or clinic. It will not be stored at home. NOTE: This sheet is a summary. It may not cover all possible information. If you have questions about this medicine, talk to your doctor, pharmacist, or health care provider.  2021 Elsevier/Gold Standard (2020-06-13 21:35:50) Doxorubicin injection What is this medicine? DOXORUBICIN (dox oh ROO bi sin) is a chemotherapy drug. It is used to treat many kinds of cancer like leukemia, lymphoma, neuroblastoma, sarcoma, and Wilms' tumor. It is also used to treat bladder cancer, breast cancer, lung cancer, ovarian cancer, stomach cancer, and thyroid cancer. This medicine may be used for other purposes; ask your health care provider or pharmacist if you have questions. COMMON BRAND NAME(S): Adriamycin, Adriamycin PFS, Adriamycin RDF, Rubex What should I tell my health care provider before I take this medicine? They need to know if you have any of these conditions:  heart disease  history of low blood counts caused by a medicine  liver disease  recent or ongoing radiation therapy  an unusual or allergic reaction to doxorubicin, other chemotherapy agents, other medicines, foods, dyes, or preservatives  pregnant or trying to get pregnant  breast-feeding How should I use this medicine? This drug is given as an infusion into a vein. It is administered in a hospital or clinic by a specially trained health care professional. If you have pain, swelling, burning  or any unusual feeling around the site of your injection, tell your health care professional right away. Talk to your pediatrician regarding the use of this medicine in children. Special care may be needed. Overdosage: If you think you have taken too much of this medicine contact a poison control center or emergency room at once. NOTE: This medicine is only for you. Do not share this medicine with others. What if I miss a dose? It is important not to miss your dose. Call your doctor or health care professional if you are unable to keep an appointment. What may interact with this medicine? This medicine may interact with the following medications:  6-mercaptopurine  paclitaxel  phenytoin  St. John's Wort  trastuzumab  verapamil This list may not describe all possible interactions. Give your health care provider a list of all the medicines, herbs, non-prescription drugs, or dietary supplements you use. Also tell them if you smoke, drink alcohol, or use illegal drugs. Some items may interact with your medicine. What should I watch for while using this medicine? This drug may make you feel generally unwell. This is not uncommon, as chemotherapy can affect healthy cells as well as cancer cells. Report any side effects. Continue your course of treatment even though you feel ill unless your doctor tells you to stop. There is a maximum amount of this medicine you should receive throughout your life. The amount depends on the medical condition being treated and your overall health. Your doctor will watch how much of this medicine you receive in your lifetime. Tell your doctor if you have taken this medicine before. You may need blood work done while you are taking this medicine. Your urine may turn red for a few days after your dose. This is not blood. If your urine is dark or brown, call your doctor. In some cases, you may be given additional medicines to help with side effects. Follow all  directions for their use. Call your doctor or health care professional for advice if you get a fever, chills or sore throat, or other symptoms of a cold or flu. Do not treat yourself. This drug decreases your body's ability to fight infections. Try to avoid being around people who are sick. This medicine may increase your risk to bruise or bleed. Call your doctor or health care professional if you notice any unusual bleeding. Talk to your doctor about your risk of cancer. You may be more at risk for certain types of cancers if you take this medicine. Do not become pregnant while taking this medicine or for 6 months after stopping it. Women should inform their doctor if they wish to become pregnant or think they might be pregnant. Men should not father a child while taking this medicine and for 6 months after stopping it. There is a potential for serious side effects to an unborn child. Talk to your health care professional or pharmacist for more information. Do not breast-feed an infant while taking this medicine. This medicine has caused ovarian  failure in some women and reduced sperm counts in some men This medicine may interfere with the ability to have a child. Talk with your doctor or health care professional if you are concerned about your fertility. This medicine may cause a decrease in Co-Enzyme Q-10. You should make sure that you get enough Co-Enzyme Q-10 while you are taking this medicine. Discuss the foods you eat and the vitamins you take with your health care professional. What side effects may I notice from receiving this medicine? Side effects that you should report to your doctor or health care professional as soon as possible:  allergic reactions like skin rash, itching or hives, swelling of the face, lips, or tongue  breathing problems  chest pain  fast or irregular heartbeat  low blood counts - this medicine may decrease the number of white blood cells, red blood cells and  platelets. You may be at increased risk for infections and bleeding.  pain, redness, or irritation at site where injected  signs of infection - fever or chills, cough, sore throat, pain or difficulty passing urine  signs of decreased platelets or bleeding - bruising, pinpoint red spots on the skin, black, tarry stools, blood in the urine  swelling of the ankles, feet, hands  tiredness  weakness Side effects that usually do not require medical attention (report to your doctor or health care professional if they continue or are bothersome):  diarrhea  hair loss  mouth sores  nail discoloration or damage  nausea  red colored urine  vomiting This list may not describe all possible side effects. Call your doctor for medical advice about side effects. You may report side effects to FDA at 1-800-FDA-1088. Where should I keep my medicine? This drug is given in a hospital or clinic and will not be stored at home. NOTE: This sheet is a summary. It may not cover all possible information. If you have questions about this medicine, talk to your doctor, pharmacist, or health care provider.  2021 Elsevier/Gold Standard (2017-04-14 11:01:26) Vincristine injection What is this medicine? VINCRISTINE (vin KRIS teen) is a chemotherapy drug. It slows the growth of cancer cells. This medicine is used to treat many types of cancer like Hodgkin's disease, leukemia, non-Hodgkin's lymphoma, neuroblastoma (brain cancer), rhabdomyosarcoma, and Wilms' tumor. This medicine may be used for other purposes; ask your health care provider or pharmacist if you have questions. COMMON BRAND NAME(S): Oncovin, Vincasar PFS What should I tell my health care provider before I take this medicine? They need to know if you have any of these conditions:  blood disorders  gout  infection (especially chickenpox, cold sores, or herpes)  kidney disease  liver disease  lung disease  nervous system disease like  Charcot-Marie-Tooth (CMT)  recent or ongoing radiation therapy  an unusual or allergic reaction to vincristine, other chemotherapy agents, other medicines, foods, dyes, or preservatives  pregnant or trying to get pregnant  breast-feeding How should I use this medicine? This drug is given as an infusion into a vein. It is administered in a hospital or clinic by a specially trained health care professional. If you have pain, swelling, burning, or any unusual feeling around the site of your injection, tell your health care professional right away. Talk to your pediatrician regarding the use of this medicine in children. While this drug may be prescribed for selected conditions, precautions do apply. Overdosage: If you think you have taken too much of this medicine contact a poison control center or emergency  room at once. NOTE: This medicine is only for you. Do not share this medicine with others. What if I miss a dose? It is important not to miss your dose. Call your doctor or health care professional if you are unable to keep an appointment. What may interact with this medicine?  certain medicines for fungal infections like itraconazole, ketoconazole, posaconazole, voriconazole  certain medicines for seizures like phenytoin This list may not describe all possible interactions. Give your health care provider a list of all the medicines, herbs, non-prescription drugs, or dietary supplements you use. Also tell them if you smoke, drink alcohol, or use illegal drugs. Some items may interact with your medicine. What should I watch for while using this medicine? This drug may make you feel generally unwell. This is not uncommon, as chemotherapy can affect healthy cells as well as cancer cells. Report any side effects. Continue your course of treatment even though you feel ill unless your doctor tells you to stop. You may need blood work done while you are taking this medicine. This medicine will  cause constipation. Try to have a bowel movement at least every 2 to 3 days. If you do not have a bowel movement for 3 days, call your doctor or health care professional. In some cases, you may be given additional medicines to help with side effects. Follow all directions for their use. Do not become pregnant while taking this medicine. Women should inform their doctor if they wish to become pregnant or think they might be pregnant. There is a potential for serious side effects to an unborn child. Talk to your health care professional or pharmacist for more information. Do not breast-feed an infant while taking this medicine. This medicine may make it more difficult to get pregnant or to father a child. Talk to your healthcare professional if you are concerned about your fertility. What side effects may I notice from receiving this medicine? Side effects that you should report to your doctor or health care professional as soon as possible:  allergic reactions like skin rash, itching or hives, swelling of the face, lips, or tongue  breathing problems  confusion or changes in emotions or moods  constipation  cough  mouth sores  muscle weakness  nausea and vomiting  pain, swelling, redness or irritation at the injection site  pain, tingling, numbness in the hands or feet  problems with balance, talking, walking  seizures  stomach pain  trouble passing urine or change in the amount of urine Side effects that usually do not require medical attention (report to your doctor or health care professional if they continue or are bothersome):  diarrhea  hair loss  jaw pain  loss of appetite This list may not describe all possible side effects. Call your doctor for medical advice about side effects. You may report side effects to FDA at 1-800-FDA-1088. Where should I keep my medicine? This drug is given in a hospital or clinic and will not be stored at home. NOTE: This sheet is a  summary. It may not cover all possible information. If you have questions about this medicine, talk to your doctor, pharmacist, or health care provider.  2021 Elsevier/Gold Standard (2019-08-01 17:05:13) Cyclophosphamide Injection What is this medicine? CYCLOPHOSPHAMIDE (sye kloe FOSS fa mide) is a chemotherapy drug. It slows the growth of cancer cells. This medicine is used to treat many types of cancer like lymphoma, myeloma, leukemia, breast cancer, and ovarian cancer, to name a few. This medicine may  be used for other purposes; ask your health care provider or pharmacist if you have questions. COMMON BRAND NAME(S): Cytoxan, Neosar What should I tell my health care provider before I take this medicine? They need to know if you have any of these conditions:  heart disease  history of irregular heartbeat  infection  kidney disease  liver disease  low blood counts, like white cells, platelets, or red blood cells  on hemodialysis  recent or ongoing radiation therapy  scarring or thickening of the lungs  trouble passing urine  an unusual or allergic reaction to cyclophosphamide, other medicines, foods, dyes, or preservatives  pregnant or trying to get pregnant  breast-feeding How should I use this medicine? This drug is usually given as an injection into a vein or muscle or by infusion into a vein. It is administered in a hospital or clinic by a specially trained health care professional. Talk to your pediatrician regarding the use of this medicine in children. Special care may be needed. Overdosage: If you think you have taken too much of this medicine contact a poison control center or emergency room at once. NOTE: This medicine is only for you. Do not share this medicine with others. What if I miss a dose? It is important not to miss your dose. Call your doctor or health care professional if you are unable to keep an appointment. What may interact with this  medicine?  amphotericin B  azathioprine  certain antivirals for HIV or hepatitis  certain medicines for blood pressure, heart disease, irregular heart beat  certain medicines that treat or prevent blood clots like warfarin  certain other medicines for cancer  cyclosporine  etanercept  indomethacin  medicines that relax muscles for surgery  medicines to increase blood counts  metronidazole This list may not describe all possible interactions. Give your health care provider a list of all the medicines, herbs, non-prescription drugs, or dietary supplements you use. Also tell them if you smoke, drink alcohol, or use illegal drugs. Some items may interact with your medicine. What should I watch for while using this medicine? Your condition will be monitored carefully while you are receiving this medicine. You may need blood work done while you are taking this medicine. Drink water or other fluids as directed. Urinate often, even at night. Some products may contain alcohol. Ask your health care professional if this medicine contains alcohol. Be sure to tell all health care professionals you are taking this medicine. Certain medicines, like metronidazole and disulfiram, can cause an unpleasant reaction when taken with alcohol. The reaction includes flushing, headache, nausea, vomiting, sweating, and increased thirst. The reaction can last from 30 minutes to several hours. Do not become pregnant while taking this medicine or for 1 year after stopping it. Women should inform their health care professional if they wish to become pregnant or think they might be pregnant. Men should not father a child while taking this medicine and for 4 months after stopping it. There is potential for serious side effects to an unborn child. Talk to your health care professional for more information. Do not breast-feed an infant while taking this medicine or for 1 week after stopping it. This medicine has  caused ovarian failure in some women. This medicine may make it more difficult to get pregnant. Talk to your health care professional if you are concerned about your fertility. This medicine has caused decreased sperm counts in some men. This may make it more difficult to father a  child. Talk to your health care professional if you are concerned about your fertility. Call your health care professional for advice if you get a fever, chills, or sore throat, or other symptoms of a cold or flu. Do not treat yourself. This medicine decreases your body's ability to fight infections. Try to avoid being around people who are sick. Avoid taking medicines that contain aspirin, acetaminophen, ibuprofen, naproxen, or ketoprofen unless instructed by your health care professional. These medicines may hide a fever. Talk to your health care professional about your risk of cancer. You may be more at risk for certain types of cancer if you take this medicine. If you are going to need surgery or other procedure, tell your health care professional that you are using this medicine. Be careful brushing or flossing your teeth or using a toothpick because you may get an infection or bleed more easily. If you have any dental work done, tell your dentist you are receiving this medicine. What side effects may I notice from receiving this medicine? Side effects that you should report to your doctor or health care professional as soon as possible:  allergic reactions like skin rash, itching or hives, swelling of the face, lips, or tongue  breathing problems  nausea, vomiting  signs and symptoms of bleeding such as bloody or black, tarry stools; red or dark brown urine; spitting up blood or brown material that looks like coffee grounds; red spots on the skin; unusual bruising or bleeding from the eyes, gums, or nose  signs and symptoms of heart failure like fast, irregular heartbeat, sudden weight gain; swelling of the ankles,  feet, hands  signs and symptoms of infection like fever; chills; cough; sore throat; pain or trouble passing urine  signs and symptoms of kidney injury like trouble passing urine or change in the amount of urine  signs and symptoms of liver injury like dark yellow or brown urine; general ill feeling or flu-like symptoms; light-colored stools; loss of appetite; nausea; right upper belly pain; unusually weak or tired; yellowing of the eyes or skin Side effects that usually do not require medical attention (report to your doctor or health care professional if they continue or are bothersome):  confusion  decreased hearing  diarrhea  facial flushing  hair loss  headache  loss of appetite  missed menstrual periods  signs and symptoms of low red blood cells or anemia such as unusually weak or tired; feeling faint or lightheaded; falls  skin discoloration This list may not describe all possible side effects. Call your doctor for medical advice about side effects. You may report side effects to FDA at 1-800-FDA-1088. Where should I keep my medicine? This drug is given in a hospital or clinic and will not be stored at home. NOTE: This sheet is a summary. It may not cover all possible information. If you have questions about this medicine, talk to your doctor, pharmacist, or health care provider.  2021 [employer] (2019-06-05 09:53:29)

## 2020-11-15 ENCOUNTER — Inpatient Hospital Stay: Payer: 59

## 2020-11-15 ENCOUNTER — Inpatient Hospital Stay (HOSPITAL_BASED_OUTPATIENT_CLINIC_OR_DEPARTMENT_OTHER): Payer: 59 | Admitting: Nurse Practitioner

## 2020-11-15 ENCOUNTER — Encounter: Payer: Self-pay | Admitting: *Deleted

## 2020-11-15 DIAGNOSIS — C8520 Mediastinal (thymic) large B-cell lymphoma, unspecified site: Secondary | ICD-10-CM

## 2020-11-15 NOTE — Progress Notes (Signed)
Virtual Visit Progress Note  [place] NOTE [facility]  Telephone:([phone] [phone]  Patient Care Team: Patient, [name] as PCP - General ([place]) Telford Nab, RN as Oncology Nurse Navigator   Name of the patient: Jennifer Cole  [id number]  October 26, 1965   Date of visit: 11/15/20  I connected with [name] on 11/15/20 at 11:00 AM EST by telephone visit and verified that I am speaking with the correct person using two identifiers.   I discussed the limitations, risks, security and privacy concerns of performing an evaluation and management service by telemedicine and the availability of in-person appointments. I also discussed with the patient that there may be a patient responsible charge related to this service. The patient expressed understanding and agreed to proceed.   Other persons participating in the visit and their role in the encounter: Magdalene Patricia, Therapist, sports (Nurse Navigator & Chemo Education)  Patient's location: home Provider's location: clinic  Diagnosis-   Chief complaint/Reason for visit- Initial Meeting for 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 I introduced the chemo care clinic and we discussed that the role of the clinic is to assist those  who are at an increased risk of emergency room visits and/or complications during the course of chemotherapy treatment. We discussed that the increased risk takes into account factors such as age, performance status, and co-morbidities. We also discussed that for some, this might include barriers to care such as not having a primary care provider, lack of insurance/transportation, or not being able to afford medications. We discussed that the goal of the program is to help prevent unplanned ER visits and help reduce complications during chemotherapy. We do this by discussing specific risk factors to each individual and identifying ways that we can help improve these risk factors and reduce barriers to care.  Feeling nervous and uncertain of the unknown.   ECOG FS:3 - Symptomatic, >50% confined to bed  Review of systems- Review of Systems  Constitutional: Positive for malaise/fatigue. Negative for chills, diaphoresis, fever and weight loss.  HENT: Negative for congestion, ear discharge, ear pain, nosebleeds, sinus pain and sore throat.   Eyes: Negative for pain and redness.  Respiratory: Positive for shortness of breath (improved). Negative for cough, hemoptysis, sputum production, wheezing and stridor.   Cardiovascular: Negative for chest pain, palpitations and leg swelling.  Gastrointestinal: Negative for abdominal pain, constipation, diarrhea, nausea and vomiting.  Genitourinary: Positive for flank pain. Negative for dysuria.  Musculoskeletal: Negative for falls, joint pain and myalgias.  Skin: Negative for rash.  Neurological: Negative for dizziness, loss of consciousness, weakness and headaches.  Psychiatric/Behavioral: Negative for depression. The patient is not nervous/anxious and does not have insomnia.   All other systems reviewed and are negative.    No Known Allergies  Past Medical History:  Diagnosis  Date  . Cancer [facility])    cervical    Past Surgical History:  Procedure  Laterality Date  . IR PERC PLEURAL DRAIN W/INDWELL CATH W/IMG GUIDE  11/11/2020    Social History   Socioeconomic History  . Marital status: Legally Separated    Spouse name: Not on file  . Number of children: Not on file  . Years of education: Not on file  . Highest education level: Not on file  Occupational History  . Not on file  Tobacco Use  . Smoking status: Never Smoker  . Smokeless tobacco: Never Used  Substance and Sexual Activity  . Alcohol use: Never  . Drug use: Not on file  . Sexual activity: Not on file  Other Topics Concern  . Not on file  Social History Narrative  . Not on file   Social Determinants of Health   Financial Resource Strain: Not on file  Food Insecurity: Not on file  Transportation Needs: Not on file  Physical Activity: Not on file  Stress: Not on file  Social Connections: Not on file  Intimate Partner Violence: Not on file    No family history on file.   Current Outpatient Medications:  .  acetaminophen (TYLENOL) 500 MG tablet, Take 1 tablet (500 mg total) by mouth every 6 (six) hours as needed., Disp: 30 tablet, Rfl: 0 .  albuterol (VENTOLIN HFA) 108 (90 Base) MCG/ACT inhaler, Inhale 2 puffs into the lungs every 6 (six) hours as needed for wheezing or shortness of breath., Disp: 8 g, Rfl: 2 .  allopurinol (ZYLOPRIM) 300 MG tablet, Take 1 tablet (300 mg total) by mouth daily., Disp: 30 tablet, Rfl: 3 .  folic acid (FOLVITE) 1 MG tablet, Take 1 mg by mouth daily., Disp: , Rfl:  .  HYDROcodone-acetaminophen (NORCO/VICODIN) 5-325 MG tablet, Take 1 tablet by mouth every 6 (six) hours as needed for up to 5 days for moderate pain or severe pain., Disp: 20 tablet, Rfl: 0 .  ibuprofen (ADVIL) 600 MG tablet, Take 1 tablet (600 mg total) by mouth every 6 (six) hours as needed., Disp: 30 tablet, Rfl: 0 .  lidocaine-prilocaine (EMLA) cream, Apply to affected area once, Disp: 30 g, Rfl: 3 .  ondansetron (ZOFRAN) 8 MG tablet, Take 1 tablet (8 mg total) by  mouth 2 (two) times daily as needed for refractory nausea / vomiting., Disp: 60 tablet, Rfl: 2 .  predniSONE (DELTASONE) 20 MG tablet, Take 5 tablets (100 mg total) by mouth daily. Take with food on days 1-5 of chemotherapy., Disp: 25 tablet, Rfl: 5 .  prochlorperazine (COMPAZINE) 10 MG tablet, Take 1 tablet (10 mg total) by mouth every 6 (six) hours as needed (Nausea or vomiting)., Disp: 60 tablet, Rfl: 2  Physical exam: There were no vitals filed for this visit. Physical Exam   CMP Latest Ref Rng & Units 11/10/2020  Glucose 70 - 99 mg/dL 99  BUN 6 - 20 mg/dL 16  Creatinine 0.44 - 1.00 mg/dL 0.63  Sodium 135 - 145 mmol/L 137  Potassium 3.5 - 5.1 mmol/L 3.8  Chloride 98 - 111 mmol/L 103  CO2 22 - 32 mmol/L 24  Calcium 8.9 - 10.3 mg/dL 9.3   CBC Latest Ref Rng & Units 11/10/2020  WBC 4.0 - 10.5 K/uL 4.9  Hemoglobin 12.0 - 15.0 g/dL 12.2  Hematocrit 36.0 - 46.0 % 38.2  Platelets 150 - 400 K/uL 337    No images are attached to the encounter.  DG Chest 2  View  Result Date: 11/09/2020 CLINICAL DATA:  Shortness of breath.  History of lung cancer EXAM: CHEST - 2 VIEW COMPARISON:  October 23, 2020, November 04, 2020 FINDINGS: The cardiomediastinal silhouette is unchanged in contour with unchanged irregular RIGHT perihilar contours and opacity of the anterior mediastinum.There is a moderate RIGHT pleural effusion, increased in comparison to prior. Trace LEFT pleural effusion. No pneumothorax. Homogeneous opacification of the RIGHT lung base, likely atelectasis. Status post cholecystectomy. Mild degenerative changes of the thoracic spine. IMPRESSION: 1. Interval increase in size of a moderate RIGHT pleural effusion. 2. Revisualization of malignancy involving the anterior mediastinum and extending along the RIGHT perihilar border. Electronically Signed   By: Valentino Saxon MD   On: 11/09/2020 11:50   DG Chest 2 View  Result Date: 10/28/2020 CLINICAL DATA:  Shortness of breath EXAM: CHEST - 2  VIEW COMPARISON:  Chest radiograph and chest CT October 23, 2020 FINDINGS: Right pleural effusion, increased in size. Airspace opacity/consolidation with obstruction of the right lower lobe bronchus; CT shows large mass in this area. Left lung is clear. Heart is upper normal in size. Pulmonary vascularity on the left is normal. Pulmonary vascularity on the right appears grossly normal, although there is soft tissue fullness in this area due to adenopathy which is evident on recent CT. No appreciable bone lesions. IMPRESSION: Pleural effusion on the right has increased in size. Extensive mass with consolidation throughout portions of the right middle lobe region with mass arising from the right mediastinum and potentially pericardium. There is right hilar adenopathy, better delineated by CT. Left lung is clear. Stable cardiac silhouette. Electronically Signed   By: Lowella Grip III M.D.   On: 10/28/2020 11:31   DG Chest 2 View  Result Date: 10/23/2020 CLINICAL DATA:  55 year old female with increasing shortness of breath for 2 days. EXAM: CHEST - 2 VIEW COMPARISON:  None. FINDINGS: Moderate size layering right pleural effusion with dense right lung base opacification. No superimposed pulmonary edema or pneumothorax. The left lung appears to remain clear. Possible cardiomegaly. Right hilum and right heart border are obscured. Furthermore, possible tapered appearance of the right bronchus intermedius. Visualized tracheal air column is within normal limits. No acute osseous abnormality identified. Negative visible bowel gas pattern. Surgical clips in the upper abdomen. IMPRESSION: 1. Moderate size right pleural effusion. Underlying mass or pneumonia not excluded. 2. Questionable cardiomegaly.  No overt edema. Electronically Signed   By: Genevie Ann M.D.   On: 10/23/2020 08:51   CT Angio Chest PE W/Cm &/Or Wo Cm  Result Date: 10/23/2020 CLINICAL DATA:  55 year old female with increasing shortness of breath and  abnormal chest x-ray this morning. EXAM: CT ANGIOGRAPHY CHEST WITH CONTRAST TECHNIQUE: Multidetector CT imaging of the chest was performed using the standard protocol during bolus administration of intravenous contrast. Multiplanar CT image reconstructions and MIPs were obtained to evaluate the vascular anatomy. CONTRAST:  73mL OMNIPAQUE IOHEXOL 350 MG/ML SOLN COMPARISON:  Chest radiographs 10/23/2020. FINDINGS: Cardiovascular: Adequate contrast bolus timing in the pulmonary arterial tree. Respiratory motion degrades detail of the distal left lower lobe pulmonary arteries. Elsewhere no focal filling defect identified in the pulmonary arteries to suggest acute pulmonary embolism. However, there is a large mediastinal mass which severely narrows the right middle and upper lobe pulmonary artery branches (series 6, image 121). The mass infiltrates the superior mediastinum and is also inseparable from the ascending aorta, some of the proximal great vessels, cardiac base and right heart border. No pericardial effusion. No cardiomegaly.  Aorta and proximal great vessels remain patent without irregularity. The mass also severely attenuates the right pulmonary veins. Mediastinum/Nodes: Large, infiltrative mediastinal mass occupying the superior and anterior mediastinum tracking inferiorly on the right. The mass encompasses 9.9 x 14.1 x 14.2 cm (AP by transverse by CC). See series 5, image 51 and coronal series 10, image 30. Slightly Discontinuous ex-nodal disease is noted about the disease great vessels. There is no lymphadenopathy in the visible lower neck or in the axilla. Associated regional mass effect, including on the right airways, see below. Lungs/Pleura: Moderate to large mixed subpleural and layering right pleural effusion with simple fluid density. Compressive atelectasis of virtually the entire right lower lobe. Atelectasis of the right middle lobe is related both to pleural fluid and the large mediastinal mass.  There is irregular narrowing of the bronchus intermedius (series 7, image 42) and the other right hilar airways are also encased by tumor. Major airways do remain patent. In the left lung 2 indeterminate although somewhat sub solid appearing pulmonary nodules are identified in the peripheral upper lobe (series 7, image 28, 4-5 mm) and lower lobe just above the diaphragm (6-7 mm image 71). Trace left pleural fluid. Upper Abdomen: Absent gallbladder. Visible liver, spleen, pancreas, adrenal glands, kidneys and bowel remain within normal limits. Musculoskeletal: No sternal or rib destruction related to the mass. No acute or suspicious osseous lesion is identified. Review of the MIP images confirms the above findings. IMPRESSION: 1. 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 Discontinuous ex-nodal disease in the superior prevascular space. Top differential considerations include small cell carcinoma, lymphoma, and non-small cell bronchogenic carcinoma. 2. Superimposed moderate to large layering and sub-pulmonic right pleural effusion associated right lung atelectasis. 3. Two small indeterminate left lung pulmonary nodules. 4. Negative for acute pulmonary embolus. No metastatic disease identified in the upper abdomen. Electronically Signed   By: Genevie Ann M.D.   On: 10/23/2020 11:30   MR Brain W Wo Contrast  Result Date: 10/30/2020 CLINICAL DATA:  Staging of recently diagnosed lung cancer. EXAM: MRI HEAD WITHOUT AND WITH CONTRAST TECHNIQUE: Multiplanar, multiecho pulse sequences of the brain and surrounding structures were obtained without and with intravenous contrast. CONTRAST:  40mL GADAVIST GADOBUTROL 1 MMOL/ML IV SOLN COMPARISON:  None. FINDINGS: Brain: There is no evidence of an acute infarct, intracranial hemorrhage, mass, midline shift, or extra-axial fluid collection. The  ventricles and sulci are normal. A few punctate foci of T2 hyperintensity in the cerebral white matter are nonspecific and considered to be within normal limits for age. No abnormal enhancement is identified. Vascular: Major intracranial vascular flow voids are preserved. Skull and upper cervical spine: Unremarkable bone marrow signal. Sinuses/Orbits: Unremarkable orbits. Clear paranasal sinuses. Small bilateral mastoid effusions. Other: None. IMPRESSION: Unremarkable appearance of the brain for age. No evidence of intracranial metastases. Electronically Signed   By: Logan Bores M.D.   On: 10/30/2020 08:17   NM PET Image Initial (PI) Skull Base To Thigh  Result Date: 10/30/2020 CLINICAL DATA:  Initial treatment strategy for mediastinal mass. EXAM: NUCLEAR MEDICINE PET SKULL BASE TO THIGH TECHNIQUE: 11.4 mCi F-18 FDG was injected intravenously. Full-ring PET imaging was performed from the skull base to thigh after the radiotracer. CT data was obtained and used for attenuation correction and anatomic localization. Fasting blood glucose: 72 mg/dl COMPARISON:  CT scan 10/30/2020 FINDINGS: Mediastinal blood pool activity: SUV max 1.97  Liver activity: SUV max NA NECK: No neck mass or adenopathy. Incidental CT findings: none CHEST: Hypermetabolic soft tissue mass noted in the left upper chest wall area with ill-defined soft tissue density which could be a nodal mass measures a maximum of 14 mm and the SUV max is 19.07. Small subclavicular lymph node measures 8 mm on image 63/3 and has an SUV max of 3.37. No breast masses, axillary or subpectoral adenopathy. Large anterior mediastinal mass also invading the middle mediastinum on the right side. The lesion is markedly hypermetabolic with SUV max of 76.19. There is an area of chest wall invasion along the left side of the sternum. There is also a separate pleural nodule on the left side measuring 10 mm with an SUV max of 4.20. A small pleural nodule is also noted  posterior to the descending thoracic aorta on image number 74/3. It measures 10 mm and the SUV max is 6.21. Moderate-sized right pleural effusion is noted but no definite hypermetabolic right-sided pleural nodules. A few small indeterminate pulmonary nodules are also noted. Incidental CT findings: none ABDOMEN/PELVIS: No hypermetabolic solid abdominal organ lesions and no hypermetabolic lymphadenopathy. No inguinal adenopathy. Incidental CT findings: none SKELETON: No osseous lesions are identified. Incidental CT findings: none IMPRESSION: 1. Large markedly hypermetabolic anterior mediastinal mass also involving the middle mediastinum on the right side. Findings suspicious for thymic neoplasm. Lymphoma and germ cell tumors would be other possibilities. The lesion appears to invade the left chest wall along the lateral margin of the sternum. 2. Associated small hypermetabolic pleural nodules on the left side. 3. 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 4. A few small indeterminate pulmonary nodules are noted. 5. Hypermetabolic soft tissue mass in the left chest wall anterior to the left first rib could represent adenopathy. 6. No findings for abdominal/pelvic metastatic disease or osseous metastatic disease. Electronically Signed   By: Marijo Sanes M.D.   On: 10/30/2020 17:06   CT BIOPSY  Result Date: 11/04/2020 INDICATION: 55 year old with a large mediastinal mass. Tissue diagnosis is needed. EXAM: CT-GUIDED CORE BIOPSY OF MEDIASTINAL MASS MEDICATIONS: None. ANESTHESIA/SEDATION: Moderate (conscious) sedation was employed during this procedure. A total of Versed 2.0 mg and Fentanyl 100 mcg was administered intravenously. Moderate Sedation Time: 16 minutes. The patient's level of consciousness and vital signs were monitored continuously by radiology nursing throughout the procedure under my direct supervision. FLUOROSCOPY TIME:  Fluoroscopy Time: None COMPLICATIONS: None immediate. PROCEDURE: Informed written  consent was obtained from the patient after a thorough discussion of the procedural risks, benefits and alternatives. All questions were addressed. A timeout was performed prior to the initiation of the procedure. Patient was placed supine on the CT scanner. CT images through the chest were obtained. The anterior chest was prepped with chlorhexidine and sterile field was created. Skin and soft tissues were anesthetized with 1% lidocaine. Using CT guidance, a 17 gauge coaxial needle was directed into the anterior mediastinal mass along the left side of the sternum. Needle was directed into the mediastinal mass and needle position was confirmed with CT. Three core biopsies were obtained with an 18 gauge core device. Specimens were placed on Telfa pad with saline. Needle was removed without complication and follow up CT images were obtained. Bandage placed over the puncture site. FINDINGS: Again noted is a large anterior mediastinal mass with low-density along the right side of the mass. Biopsy needle was directed into the mass along the left side of the sternum. Needle position confirmed within the mediastinal mass.  No significant bleeding or hematoma formation at the end of the procedure. Small loculated right pleural effusion. Tiny focus of gas within the right pleural effusion related to recent thoracentesis. No significant pleural gas. Again noted is a small nodule in the left upper lobe measuring close to 6 mm. IMPRESSION: CT-guided core biopsy of the anterior mediastinal mass. Electronically Signed   By: Markus Daft M.D.   On: 11/04/2020 15:34   DG Chest Port 1 View  Result Date: 11/04/2020 CLINICAL DATA:  Status post thoracentesis EXAM: PORTABLE CHEST 1 VIEW COMPARISON:  October 30, 2020. FINDINGS: No pneumothorax evident. Small right pleural effusion evident. There is mild right base atelectasis. The lungs elsewhere are clear. There is stable cardiomegaly with pulmonary vascularity normal. No adenopathy.  No bone lesions. IMPRESSION: No appreciable pneumothorax. Small right pleural effusion with right base atelectasis. No consolidation. Stable cardiomegaly. Electronically Signed   By: Lowella Grip III M.D.   On: 11/04/2020 15:15   DG Chest Port 1 View  Result Date: 10/30/2020 CLINICAL DATA:  Status post thoracentesis. EXAM: PORTABLE CHEST 1 VIEW COMPARISON:  10/28/2020 FINDINGS: Interval near complete evacuation of the right pleural fluid collection. No postprocedural pneumothorax is identified. Streaky right basilar atelectasis. IMPRESSION: Near complete evacuation of right pleural fluid collection without postprocedural pneumothorax. Electronically Signed   By: Marijo Sanes M.D.   On: 10/30/2020 13:04   DG Chest Port 1 View  Result Date: 10/23/2020 CLINICAL DATA:  Post thoracentesis EXAM: PORTABLE CHEST 1 VIEW COMPARISON:  CT 10/23/2020 FINDINGS: Interval decrease in the size of the right pleural effusion with some small to moderate volume residual layering fluid in the right lung base. Suspect at least trace left effusion. Residual atelectatic changes are noted in both lungs. Redemonstration of the irregular heterogeneous mediastinal contours compatible with the infiltrative mediastinal mass seen on comparison CT imaging. Previously seen pulmonary nodules are less well visualized radiographically. No visible pneumothorax. No acute osseous or soft tissue abnormality. Telemetry leads overlie the chest. IMPRESSION: 1. Interval decrease in the size of the right pleural effusion with some small to moderate volume residual layering fluid in the right lung base. No pneumothorax. Suspect at least trace left effusion. 2. Irregular mediastinal margins compatible with the infiltrative mass seen on comparison CT. Electronically Signed   By: Lovena Le M.D.   On: 10/23/2020 16:11   IR PERC PLEURAL DRAIN W/INDWELL CATH W/IMG GUIDE  Result Date: 11/11/2020 CLINICAL DATA:  55 year old female with malignant  right pleural effusion. EXAM: INSERTION OF TUNNELED right SIDED PLEURAL DRAINAGE CATHETER COMPARISON:  11/09/2020 MEDICATIONS: Ancef 2 gm IV; Antibiotic was administered in an appropriate time interval for the procedure. ANESTHESIA/SEDATION: Moderate (conscious) sedation was employed during this procedure. A total of Versed 3 mg and Fentanyl 150 mcg was administered intravenously. Moderate Sedation Time: 34 minutes. The patient's level of consciousness and vital signs were monitored continuously by radiology nursing throughout the procedure under my direct supervision. FLUOROSCOPY TIME:  FLUOROSCOPY TIME 0.4 min (8.6 mGy) COMPLICATIONS: None immediate. PROCEDURE: The procedure, risks, benefits, and alternatives were explained to the patient, who wishes to proceed with the placement of this permanent pleural catheter as they are seeking palliative care. The patient understands and consents to the procedure. The right lateral chest and upper abdomen were prepped and draped in a sterile fashion, and a sterile drape was applied covering the operative field. A sterile gown and sterile gloves were used for the procedure. Initial ultrasound scanning and fluoroscopic imaging demonstrates a recurrent moderate  to large pleural effusion. Under direct ultrasound guidance, the inferior lateral pleural space was accessed with a Yueh sheath needle after the overlying soft tissues were anesthetized with 1% lidocaine with epinephrine. A Rosen wire was then advanced under fluoroscopy into the pleural space. A 15.5 French tunneled Pleur-X catheter was tunneled from an incision in the right upper abdominal quadrant to the access site. The pleural access site was serially dilated under fluoroscopy, ultimately allowing placement of a peel-away sheath. The catheter was advanced through the peel-away sheath. The sheath was then removed. Final catheter positioning was confirmed with a fluoroscopic radiographic image. The access incision was  closed with Dermabond. A 0 silk retention suture was applied at the catheter exit site. Thoracentesis was performed through the new catheter utilizing provided bulb vacuum assisted drainage bag. Dressings were applied. The patient tolerated the above procedure well without immediate postprocedural complication. FINDINGS: Preprocedural ultrasound scanning demonstrates a recurrent moderate sized right sided pleural effusion. After ultrasound and fluoroscopic guided placement, the catheter is directed superiorly Following catheter placement, approximately 50 cc of translucent, straw-colored pleural fluid was removed. IMPRESSION: Successful placement of permanent, tunneled right pleural drainage catheter via lateral approach. 50 mL of translucent, straw-colored pleural fluid was removed following catheter placement. Ruthann Cancer, MD [facility] Radiology Electronically Signed   By: Ruthann Cancer MD   On: 11/11/2020 15:00   US THORACENTESIS ASP PLEURAL SPACE W/IMG GUIDE  Result Date: 11/04/2020 INDICATION: Mediastinal mass. Shortness of breath. Recurrent right pleural effusion. Request for therapeutic thoracentesis. EXAM: ULTRASOUND GUIDED RIGHT THORACENTESIS MEDICATIONS: 1% plain lidocaine, 10 mL COMPLICATIONS: None immediate. PROCEDURE: An ultrasound guided thoracentesis was thoroughly discussed with the patient and questions answered. The benefits, risks, alternatives and complications were also discussed. The patient understands and wishes to proceed with the procedure. Written consent was obtained. Ultrasound was performed to localize and mark an adequate pocket of fluid in the right chest. The area was then prepped and draped in the normal sterile fashion. 1% Lidocaine was used for local anesthesia. Under ultrasound guidance a 6 Fr Safe-T-Centesis catheter was introduced. Thoracentesis was performed. The catheter was removed and a dressing applied. FINDINGS: A  total of approximately 900 mL of clear yellow fluid was removed. Ultrasound of the right chest demonstrates right pleural effusion with interval development of loculations. IMPRESSION: Successful ultrasound guided right thoracentesis yielding 900 mL of pleural fluid. Read by: Ascencion Dike PA-C Electronically Signed   By: Markus Daft M.D.   On: 11/04/2020 12:52   US THORACENTESIS ASP PLEURAL SPACE W/IMG GUIDE  Result Date: 10/30/2020 INDICATION: 55 year old with mediastinal mass and recurrent right pleural effusion. EXAM: ULTRASOUND GUIDED RIGHT THORACENTESIS MEDICATIONS: None. COMPLICATIONS: None immediate. PROCEDURE: An ultrasound guided thoracentesis was thoroughly discussed with the patient and questions answered. The benefits, risks, alternatives and complications were also discussed. The patient understands and wishes to proceed with the procedure. Written consent was obtained. Ultrasound was performed to localize and mark an adequate pocket of fluid in the right chest. The area was then prepped and draped in the normal sterile fashion. 1% Lidocaine was used for local anesthesia. Under ultrasound guidance a 6 Fr Safe-T-Centesis catheter was introduced. Thoracentesis was performed. The catheter was removed and a dressing applied. FINDINGS: A total of approximately 1.3 L of yellow fluid was removed. IMPRESSION: Successful ultrasound guided right thoracentesis yielding 1.3 L of pleural fluid. Electronically Signed   By: Markus Daft M.D.   On: 10/30/2020 13:29   US THORACENTESIS ASP  PLEURAL SPACE W/IMG GUIDE  Result Date: 10/23/2020 INDICATION: RIGHT PLEURAL EFFUSION EXAM: ULTRASOUND GUIDED RIGHT THORACENTESIS MEDICATIONS: 1% LIDOCAINE LOCAL COMPLICATIONS: None immediate. PROCEDURE: An ultrasound guided thoracentesis was thoroughly discussed with the patient and questions answered. The benefits, risks, alternatives and complications were also discussed. The patient understands and wishes to proceed with the  procedure. Written consent was obtained. Ultrasound was performed to localize and mark an adequate pocket of fluid in the right chest. The area was then prepped and draped in the normal sterile fashion. 1% Lidocaine was used for local anesthesia. Under ultrasound guidance a 6 Fr Safe-T-Centesis catheter was introduced. Thoracentesis was performed. The catheter was removed and a dressing applied. FINDINGS: A total of approximately 1.1 L of CLEAR PLEURAL fluid was removed. Samples were sent to the laboratory as requested by the clinical team. IMPRESSION: Successful ultrasound guided right thoracentesis yielding 1.1 L of pleural fluid. Electronically Signed   By: Jerilynn Mages.  Shick M.D.   On: 10/23/2020 15:59     Assessment and plan- Patient is a 55 y.o. female who presents to [facility] for initial meeting in preparation for starting chemotherapy for the treatment of    1. 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 Tentatively planning for c cycles of RCHOP chemotherapy with neulasta support followed by consolidation XRT.   2. Chemo Care Clinic/High Risk for ER/Hospitalization during chemotherapy- We discussed the role of the chemo care clinic and identified patient specific risk factors. I discussed that patient was identified as high risk primarily based on: no pcp, limited performance status, previous hospitalizations and ER visits, medical comorbidities, not identifying as being in a relationship, and insurance status.   3. Social Determinants of Health- we discussed that social determinants of health may have significant impacts on health and outcomes for cancer patients.  Today we discussed specific social determinants of performance status, alcohol use, depression, financial needs, food insecurity, housing, interpersonal violence, social connections, stress, tobacco use, and transportation.  After lengthy discussion the following were identified as areas of need:    Based on performance status/activity level we discussed options including home based and outpatient services, DME, and CARE program. We discusssed that patients who participate in regular physical activity report fewer negative impacts of cancer and treatments and report less fatigue. Consider evaluation with OT.   Based on depression: we discussed [facility]-referral [facility] services, psychiatry for medication management, or palliative care/symptom management as well as primary care providers. Depressed before diagnosis- symptoms are worse. Will start medication today.   Based on financial insecurity: We discussed that living with cancer can create tremendous financial burden.  We discussed options for assistance. I asked that if assistance is needed in affording medications or paying bills to please let us know so that we can provide assistance. Living with boyfriend who is working. Getting assistance with bills from [place]. Awaiting tax form from her landlord so that she can get rental assistance.   Based on lack of social connections-we discussed options for support groups at the cancer center. If interested, please notify nurse navigator to enroll. Daughter and boyfriend are support system.   Based on concern of stress-we discussed options for managing stress including healthy eating, exercise as well as participating in no charge counseling services at the cancer center and support groups.  If these are of interest, patient can notify either myself or primary nursing team.  Based on transportation need: we discussed options for transportation including  [place], paratransit, bus routes, [employer] transit, taxi/uber/lyft, and cancer center van.  I have notified primary oncology team who will help assist with arranging Lucianne Lei transportation for appointments when needed. We also discussed options for transportation on short notice/acute visits.   We also discussed the role of the Symptom  Management Clinic at [facility] for acute issues and methods of contacting clinic/provider. She denies needing specific assistance at this time.   Return to clinic as scheduled.   Referred to Primary Care made today through Patient [place].   Visit Diagnosis 1. Mediastinal (thymic) large B-cell lymphoma, unspecified body region [facility])    I discussed the assessment and treatment plan with the patient. The patient was provided an opportunity to ask questions and all were answered. The patient agreed with the plan and demonstrated an understanding of the instructions.   The patient was advised to call back or seek an in-person evaluation if the symptoms worsen or if the condition fails to improve as anticipated.  I spent 15 minutes on this telephone encounter.   Beckey Rutter, DNP, AGNP-C [place] at [facility] [phone] (clinic)

## 2020-11-15 NOTE — Patient Instructions (Signed)
You have been referred to [facility] and have an appointment scheduled in April. They will contact you if you can be seen sooner. This is to establish care with a primary care doctor.   In the meantime, you can access care through the following free and reduced cost health care locations in [place] ([address] [place], [place] [address]- [phone])  -[place] ([address], [place], [place] [address]- [phone])  -[facility] ([address], [place], [place] [address]- [phone])  -[place] ([place], [place] [phone])  -[facility] Department and [phone])

## 2020-11-15 NOTE — Progress Notes (Signed)
  Oncology Nurse Navigator Documentation  Navigator Location: [facility] (11/15/20 1300)   )Navigator Encounter Type: Education;Other: (pleurx drainage) (11/15/20 1300)                         Barriers/Navigation Needs: Coordination of Care;Education (11/15/20 1300) Education: Other (Pleurx catheter maintenance/drainage) (11/15/20 1300) Interventions: Coordination of Care;Education (11/15/20 1300)   Coordination of Care: Other (pleurx drainage) (11/15/20 1300) Education Method: Demonstration;Verbal;Video (11/15/20 1300)    Specialty Items/DME: Pleur X catheter (11/15/20 1300)    Met with patient and her daughter after chemo class to assist with pleurx catheter drainage and provide reinforcement education. Demonstration provided regarding pleurx catheter maintenance and drainage. Pt's daughter stated that she did not feel comfortable draining catheter at home after demonstration and feared she would make a mistake while training to drain. Reassured pt and her daughter that can provide drainage in the clinic while pt is here receiving chemo and hopefully once starts chemo that the frequency of drainage will decrease. Home health will have high copay cost and pt cannot afford at this time. Pt desires for catheter to be taken out when she no longer needs it. Assistance provided with drainage. Pt tolerated well with minimal discomfort. 761m of serosanguineous fluid removed during visit today. Pt states she feels less short of breath after drainage. Reviewed upcoming appts with pt and instructed to call with any questions or needs. Will follow up on Wed 3/9 when starts chemo to follow up with patient and provide further drainage if needed.        Time Spent with Patient: 30 (11/15/20 1300)

## 2020-11-16 NOTE — Progress Notes (Addendum)
[facility] [place]  Telephone:([phone] [phone]  ID: Jennifer Cole OB: [birth year]  MR#: [id number]  HQI#:[id number]  Patient Care Team: Patient, [name] as PCP - General (General Practice) Telford Nab, RN as Oncology Nurse Navigator  CHIEF COMPLAINT: Primary mediastinal B-cell lymphoma  INTERVAL HISTORY: Patient returns to clinic today for further evaluation and initiation of cycle 1 of 6 of R-CHOP.  She has new onset right arm swelling since her port was placed several days ago.  She continues to have shortness of breath and required drainage from her Pleurx catheter twice this past week.  Her flank pain has improved.  She continues to have chronic weakness and fatigue.  She continues to have a chronic cough as well.  She has no neurologic complaints.  She denies any recent fevers.  She has a fair appetite, but denies weight loss.  She has no chest pain or hemoptysis.  She denies any nausea, vomiting, constipation, or diarrhea.  She has no urinary complaints.  Patient offers no further specific complaints today.  REVIEW OF SYSTEMS:   Review of Systems  Constitutional: Positive for malaise/fatigue. Negative for fever and weight loss.  Respiratory: Positive for cough and shortness of breath. Negative for hemoptysis.   Cardiovascular: Negative.  Negative for chest pain and leg swelling.  Gastrointestinal: Negative.  Negative for abdominal pain.  Genitourinary: Negative.  Negative for dysuria.  Musculoskeletal: Negative.  Negative for back pain.  Skin: Negative.   Neurological: Positive for weakness. Negative for dizziness, focal weakness and headaches.  Psychiatric/Behavioral: Negative.  The patient is not nervous/anxious.     As per HPI. Otherwise, a complete review of systems is negative.  PAST MEDICAL HISTORY: Past Medical History:  Diagnosis Date  . Cancer ([place])    cervical    PAST SURGICAL HISTORY: Past Surgical History:  Procedure  Laterality Date  . IR PERC PLEURAL DRAIN W/INDWELL CATH W/IMG GUIDE  11/11/2020  . PORTA CATH INSERTION N/A 11/18/2020   Procedure: PORTA CATH INSERTION;  Surgeon: Algernon Huxley, MD;  Location: [facility];  Service: Cardiovascular;  Laterality: N/A;    FAMILY HISTORY: History reviewed. No pertinent family history.  ADVANCED DIRECTIVES (Y/N):  N  HEALTH MAINTENANCE: Social History   Tobacco Use  . Smoking status: Never Smoker  . Smokeless tobacco: Never Used  Substance Use Topics  . Alcohol use: Never     Colonoscopy:  PAP:  Bone density:  Lipid panel:  No Known Allergies  Current Outpatient Medications  Medication Sig Dispense Refill  . acetaminophen (TYLENOL) 500 MG tablet Take 1 tablet (500 mg total) by mouth every 6 (six) hours as needed. 30 tablet 0  . albuterol (VENTOLIN HFA) 108 (90 Base) MCG/ACT inhaler Inhale 2 puffs into the lungs every 6 (six) hours as needed for wheezing or shortness of breath. 8 g 2  . allopurinol (ZYLOPRIM) 300 MG tablet Take 1 tablet (300 mg total) by mouth daily. 30 tablet 3  . folic acid (FOLVITE) 1 MG tablet Take 1 mg by mouth daily.    Marland Kitchen ibuprofen (ADVIL) 600 MG tablet Take 1 tablet (600 mg total) by mouth every 6 (six) hours as needed. 30 tablet 0  . lidocaine-prilocaine (EMLA) cream Apply to affected area once 30 g 3  . ondansetron (ZOFRAN) 8 MG tablet Take 1 tablet (8 mg total) by mouth 2 (two) times daily as needed for refractory nausea / vomiting. 60 tablet 2  . predniSONE (DELTASONE) 20 MG tablet Take  5 tablets (100 mg total) by mouth daily. Take with food on days 1-5 of chemotherapy. 25 tablet 5  . prochlorperazine (COMPAZINE) 10 MG tablet Take 1 tablet (10 mg total) by mouth every 6 (six) hours as needed (Nausea or vomiting). 60 tablet 2   No current facility-administered medications for this visit.    OBJECTIVE: Vitals:   11/20/20 0830  BP: 106/76  Pulse: (!) 101  Resp: 20  Temp: 99.1 F (37.3 C)  SpO2: 96%     Body  mass index is 34.41 kg/m.    ECOG FS:1 - Symptomatic but completely ambulatory  General: Well-developed, well-nourished, no acute distress. Eyes: Pink conjunctiva, anicteric sclera. HEENT: Normocephalic, moist mucous membranes. Lungs: No audible wheezing or coughing. Heart: Regular rate and rhythm. Abdomen: Soft, nontender, no obvious distention. Musculoskeletal: Edema of right upper extremity greater than left.  Pleurx catheter in place. Neuro: Alert, answering all questions appropriately. Cranial nerves grossly intact. Skin: No rashes or petechiae noted. Psych: Normal affect.  LAB RESULTS:  Lab Results  Component Value Date   NA 135 11/20/2020   K 4.1 11/20/2020   CL 97 (L) 11/20/2020   CO2 27 11/20/2020   GLUCOSE 93 11/20/2020   BUN 15 11/20/2020   CREATININE 0.52 11/20/2020   CALCIUM 9.2 11/20/2020   PROT 7.3 11/20/2020   ALBUMIN 3.5 11/20/2020   AST 27 11/20/2020   ALT 12 11/20/2020   ALKPHOS 69 11/20/2020   BILITOT 0.9 11/20/2020   GFRNONAA >60 11/20/2020    Lab Results  Component Value Date   WBC 5.9 11/20/2020   NEUTROABS 4.5 11/20/2020   HGB 12.4 11/20/2020   HCT 38.0 11/20/2020   MCV 88.8 11/20/2020   PLT 279 11/20/2020     STUDIES: DG Chest 2 View  Result Date: 11/09/2020 CLINICAL DATA:  Shortness of breath.  History of lung cancer EXAM: CHEST - 2 VIEW COMPARISON:  October 23, 2020, November 04, 2020 FINDINGS: The cardiomediastinal silhouette is unchanged in contour with unchanged irregular RIGHT perihilar contours and opacity of the anterior mediastinum.There is a moderate RIGHT pleural effusion, increased in comparison to prior. Trace LEFT pleural effusion. No pneumothorax. Homogeneous opacification of the RIGHT lung base, likely atelectasis. Status post cholecystectomy. Mild degenerative changes of the thoracic spine. IMPRESSION: 1. Interval increase in size of a moderate RIGHT pleural effusion. 2. Revisualization of malignancy involving the anterior  mediastinum and extending along the RIGHT perihilar border. Electronically Signed   By: Valentino Saxon MD   On: 11/09/2020 11:50   DG Chest 2 View  Result Date: 10/28/2020 CLINICAL DATA:  Shortness of breath EXAM: CHEST - 2 VIEW COMPARISON:  Chest radiograph and chest CT October 23, 2020 FINDINGS: Right pleural effusion, increased in size. Airspace opacity/consolidation with obstruction of the right lower lobe bronchus; CT shows large mass in this area. Left lung is clear. Heart is upper normal in size. Pulmonary vascularity on the left is normal. Pulmonary vascularity on the right appears grossly normal, although there is soft tissue fullness in this area due to adenopathy which is evident on recent CT. No appreciable bone lesions. IMPRESSION: Pleural effusion on the right has increased in size. Extensive mass with consolidation throughout portions of the right middle lobe region with mass arising from the right mediastinum and potentially pericardium. There is right hilar adenopathy, better delineated by CT. Left lung is clear. Stable cardiac silhouette. Electronically Signed   By: Lowella Grip III M.D.   On: 10/28/2020 11:31   DG Chest  2 View  Result Date: 10/23/2020 CLINICAL DATA:  55 year old female with increasing shortness of breath for 2 days. EXAM: CHEST - 2 VIEW COMPARISON:  None. FINDINGS: Moderate size layering right pleural effusion with dense right lung base opacification. No superimposed pulmonary edema or pneumothorax. The left lung appears to remain clear. Possible cardiomegaly. Right hilum and right heart border are obscured. Furthermore, possible tapered appearance of the right bronchus intermedius. Visualized tracheal air column is within normal limits. No acute osseous abnormality identified. Negative visible bowel gas pattern. Surgical clips in the upper abdomen. IMPRESSION: 1. Moderate size right pleural effusion. Underlying mass or pneumonia not excluded. 2. Questionable  cardiomegaly.  No overt edema. Electronically Signed   By: Genevie Ann M.D.   On: 10/23/2020 08:51   CT Angio Chest PE W/Cm &/Or Wo Cm  Result Date: 10/23/2020 CLINICAL DATA:  55 year old female with increasing shortness of breath and abnormal chest x-ray this morning. EXAM: CT ANGIOGRAPHY CHEST WITH CONTRAST TECHNIQUE: Multidetector CT imaging of the chest was performed using the standard protocol during bolus administration of intravenous contrast. Multiplanar CT image reconstructions and MIPs were obtained to evaluate the vascular anatomy. CONTRAST:  68m OMNIPAQUE IOHEXOL 350 MG/ML SOLN COMPARISON:  Chest radiographs 10/23/2020. FINDINGS: Cardiovascular: Adequate contrast bolus timing in the pulmonary arterial tree. Respiratory motion degrades detail of the distal left lower lobe pulmonary arteries. Elsewhere no focal filling defect identified in the pulmonary arteries to suggest acute pulmonary embolism. However, there is a large mediastinal mass which severely narrows the right middle and upper lobe pulmonary artery branches (series 6, image 121). The mass infiltrates the superior mediastinum and is also inseparable from the ascending aorta, some of the proximal great vessels, cardiac base and right heart border. No pericardial effusion. No cardiomegaly. Aorta and proximal great vessels remain patent without irregularity. The mass also severely attenuates the right pulmonary veins. Mediastinum/Nodes: Large, infiltrative mediastinal mass occupying the superior and anterior mediastinum tracking inferiorly on the right. The mass encompasses 9.9 x 14.1 x 14.2 cm (AP by transverse by CC). See series 5, image 51 and coronal series 10, image 30. Slightly Discontinuous ex-nodal disease is noted about the disease great vessels. There is no lymphadenopathy in the visible lower neck or in the axilla. Associated regional mass effect, including on the right airways, see below. Lungs/Pleura: Moderate to large mixed  subpleural and layering right pleural effusion with simple fluid density. Compressive atelectasis of virtually the entire right lower lobe. Atelectasis of the right middle lobe is related both to pleural fluid and the large mediastinal mass. There is irregular narrowing of the bronchus intermedius (series 7, image 42) and the other right hilar airways are also encased by tumor. Major airways do remain patent. In the left lung 2 indeterminate although somewhat sub solid appearing pulmonary nodules are identified in the peripheral upper lobe (series 7, image 28, 4-5 mm) and lower lobe just above the diaphragm (6-7 mm image 71). Trace left pleural fluid. Upper Abdomen: Absent gallbladder. Visible liver, spleen, pancreas, adrenal glands, kidneys and bowel remain within normal limits. Musculoskeletal: No sternal or rib destruction related to the mass. No acute or suspicious osseous lesion is identified. Review of the MIP images confirms the above findings. IMPRESSION: 1. 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 Discontinuous ex-nodal disease in the superior prevascular space. Top differential considerations include small cell carcinoma, lymphoma, and non-small cell  bronchogenic carcinoma. 2. Superimposed moderate to large layering and sub-pulmonic right pleural effusion associated right lung atelectasis. 3. Two small indeterminate left lung pulmonary nodules. 4. Negative for acute pulmonary embolus. No metastatic disease identified in the upper abdomen. Electronically Signed   By: Genevie Ann M.D.   On: 10/23/2020 11:30   MR Brain W Wo Contrast  Result Date: 10/30/2020 CLINICAL DATA:  Staging of recently diagnosed lung cancer. EXAM: MRI HEAD WITHOUT AND WITH CONTRAST TECHNIQUE: Multiplanar, multiecho pulse sequences of the brain and surrounding structures were obtained without and  with intravenous contrast. CONTRAST:  20m GADAVIST GADOBUTROL 1 MMOL/ML IV SOLN COMPARISON:  None. FINDINGS: Brain: There is no evidence of an acute infarct, intracranial hemorrhage, mass, midline shift, or extra-axial fluid collection. The ventricles and sulci are normal. A few punctate foci of T2 hyperintensity in the cerebral white matter are nonspecific and considered to be within normal limits for age. No abnormal enhancement is identified. Vascular: Major intracranial vascular flow voids are preserved. Skull and upper cervical spine: Unremarkable bone marrow signal. Sinuses/Orbits: Unremarkable orbits. Clear paranasal sinuses. Small bilateral mastoid effusions. Other: None. IMPRESSION: Unremarkable appearance of the brain for age. No evidence of intracranial metastases. Electronically Signed   By: ALogan BoresM.D.   On: 10/30/2020 08:17   PERIPHERAL VASCULAR CATHETERIZATION  Result Date: 11/18/2020 See op note  NM PET Image Initial (PI) Skull Base To Thigh  Result Date: 10/30/2020 CLINICAL DATA:  Initial treatment strategy for mediastinal mass. EXAM: NUCLEAR MEDICINE PET SKULL BASE TO THIGH TECHNIQUE: 11.4 mCi F-18 FDG was injected intravenously. Full-ring PET imaging was performed from the skull base to thigh after the radiotracer. CT data was obtained and used for attenuation correction and anatomic localization. Fasting blood glucose: 72 mg/dl COMPARISON:  CT scan 10/30/2020 FINDINGS: Mediastinal blood pool activity: SUV max 1.97 Liver activity: SUV max NA NECK: No neck mass or adenopathy. Incidental CT findings: none CHEST: Hypermetabolic soft tissue mass noted in the left upper chest wall area with ill-defined soft tissue density which could be a nodal mass measures a maximum of 14 mm and the SUV max is 19.07. Small subclavicular lymph node measures 8 mm on image 63/3 and has an SUV max of 3.37. No breast masses, axillary or subpectoral adenopathy. Large anterior mediastinal mass also invading the  middle mediastinum on the right side. The lesion is markedly hypermetabolic with SUV max of 300.92 There is an area of chest wall invasion along the left side of the sternum. There is also a separate pleural nodule on the left side measuring 10 mm with an SUV max of 4.20. A small pleural nodule is also noted posterior to the descending thoracic aorta on image number 74/3. It measures 10 mm and the SUV max is 6.21. Moderate-sized right pleural effusion is noted but no definite hypermetabolic right-sided pleural nodules. A few small indeterminate pulmonary nodules are also noted. Incidental CT findings: none ABDOMEN/PELVIS: No hypermetabolic solid abdominal organ lesions and no hypermetabolic lymphadenopathy. No inguinal adenopathy. Incidental CT findings: none SKELETON: No osseous lesions are identified. Incidental CT findings: none IMPRESSION: 1. Large markedly hypermetabolic anterior mediastinal mass also involving the middle mediastinum on the right side. Findings suspicious for thymic neoplasm. Lymphoma and germ cell tumors would be other possibilities. The lesion appears to invade the left chest wall along the lateral margin of the sternum. 2. Associated small hypermetabolic pleural nodules on the left side. 3. 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 4. A few small indeterminate pulmonary nodules  are noted. 5. Hypermetabolic soft tissue mass in the left chest wall anterior to the left first rib could represent adenopathy. 6. No findings for abdominal/pelvic metastatic disease or osseous metastatic disease. Electronically Signed   By: Marijo Sanes M.D.   On: 10/30/2020 17:06   CT BIOPSY  Result Date: 11/04/2020 INDICATION: 55 year old with a large mediastinal mass. Tissue diagnosis is needed. EXAM: CT-GUIDED CORE BIOPSY OF MEDIASTINAL MASS MEDICATIONS: None. ANESTHESIA/SEDATION: Moderate (conscious) sedation was employed during this procedure. A total of Versed 2.0 mg and Fentanyl 100 mcg was administered  intravenously. Moderate Sedation Time: 16 minutes. The patient's level of consciousness and vital signs were monitored continuously by radiology nursing throughout the procedure under my direct supervision. FLUOROSCOPY TIME:  Fluoroscopy Time: None COMPLICATIONS: None immediate. PROCEDURE: Informed written consent was obtained from the patient after a thorough discussion of the procedural risks, benefits and alternatives. All questions were addressed. A timeout was performed prior to the initiation of the procedure. Patient was placed supine on the CT scanner. CT images through the chest were obtained. The anterior chest was prepped with chlorhexidine and sterile field was created. Skin and soft tissues were anesthetized with 1% lidocaine. Using CT guidance, a 17 gauge coaxial needle was directed into the anterior mediastinal mass along the left side of the sternum. Needle was directed into the mediastinal mass and needle position was confirmed with CT. Three core biopsies were obtained with an 18 gauge core device. Specimens were placed on Telfa pad with saline. Needle was removed without complication and follow up CT images were obtained. Bandage placed over the puncture site. FINDINGS: Again noted is a large anterior mediastinal mass with low-density along the right side of the mass. Biopsy needle was directed into the mass along the left side of the sternum. Needle position confirmed within the mediastinal mass. No significant bleeding or hematoma formation at the end of the procedure. Small loculated right pleural effusion. Tiny focus of gas within the right pleural effusion related to recent thoracentesis. No significant pleural gas. Again noted is a small nodule in the left upper lobe measuring close to 6 mm. IMPRESSION: CT-guided core biopsy of the anterior mediastinal mass. Electronically Signed   By: Markus Daft M.D.   On: 11/04/2020 15:34   DG Chest Port 1 View  Result Date: 11/04/2020 CLINICAL DATA:   Status post thoracentesis EXAM: PORTABLE CHEST 1 VIEW COMPARISON:  October 30, 2020. FINDINGS: No pneumothorax evident. Small right pleural effusion evident. There is mild right base atelectasis. The lungs elsewhere are clear. There is stable cardiomegaly with pulmonary vascularity normal. No adenopathy. No bone lesions. IMPRESSION: No appreciable pneumothorax. Small right pleural effusion with right base atelectasis. No consolidation. Stable cardiomegaly. Electronically Signed   By: Lowella Grip III M.D.   On: 11/04/2020 15:15   DG Chest Port 1 View  Result Date: 10/30/2020 CLINICAL DATA:  Status post thoracentesis. EXAM: PORTABLE CHEST 1 VIEW COMPARISON:  10/28/2020 FINDINGS: Interval near complete evacuation of the right pleural fluid collection. No postprocedural pneumothorax is identified. Streaky right basilar atelectasis. IMPRESSION: Near complete evacuation of right pleural fluid collection without postprocedural pneumothorax. Electronically Signed   By: Marijo Sanes M.D.   On: 10/30/2020 13:04   DG Chest Port 1 View  Result Date: 10/23/2020 CLINICAL DATA:  Post thoracentesis EXAM: PORTABLE CHEST 1 VIEW COMPARISON:  CT 10/23/2020 FINDINGS: Interval decrease in the size of the right pleural effusion with some small to moderate volume residual layering fluid in the right lung  base. Suspect at least trace left effusion. Residual atelectatic changes are noted in both lungs. Redemonstration of the irregular heterogeneous mediastinal contours compatible with the infiltrative mediastinal mass seen on comparison CT imaging. Previously seen pulmonary nodules are less well visualized radiographically. No visible pneumothorax. No acute osseous or soft tissue abnormality. Telemetry leads overlie the chest. IMPRESSION: 1. Interval decrease in the size of the right pleural effusion with some small to moderate volume residual layering fluid in the right lung base. No pneumothorax. Suspect at least trace left  effusion. 2. Irregular mediastinal margins compatible with the infiltrative mass seen on comparison CT. Electronically Signed   By: Lovena Le M.D.   On: 10/23/2020 16:11   CT BONE MARROW BIOPSY & ASPIRATION  Result Date: 11/18/2020 INDICATION: Recent diagnosis of B-cell lymphoma. Please perform CT-guided bone marrow biopsy for tissue diagnostic purposes. EXAM: CT-GUIDED BONE MARROW BIOPSY AND ASPIRATION MEDICATIONS: None ANESTHESIA/SEDATION: Fentanyl 100 mcg IV; Versed 3 mg IV Sedation Time: 12 Minutes; The patient was continuously monitored during the procedure by the interventional radiology nurse under my direct supervision. COMPLICATIONS: None immediate. PROCEDURE: Informed consent was obtained from the patient following an explanation of the procedure, risks, benefits and alternatives. The patient understands, agrees and consents for the procedure. All questions were addressed. A time out was performed prior to the initiation of the procedure. The patient was positioned left lateral decubitus and non-contrast localization CT was performed of the pelvis to demonstrate the iliac marrow spaces. The operative site was prepped and draped in the usual sterile fashion. Under sterile conditions and local anesthesia, a 22 gauge spinal needle was utilized for procedural planning. Next, an 11 gauge coaxial bone biopsy needle was advanced into the left iliac marrow space. Needle position was confirmed with CT imaging. Initially, a bone marrow aspiration was performed. Next, a bone marrow biopsy was obtained with the 11 gauge outer bone marrow device. The 11 gauge coaxial bone biopsy needle was re-advanced into a slightly different location within the left iliac marrow space, positioning was confirmed with CT imaging and an additional bone marrow biopsy was obtained. The needle was removed and superficial hemostasis was obtained with manual compression. A dressing was applied. The patient tolerated the procedure well  without immediate post procedural complication. IMPRESSION: Successful CT guided left iliac bone marrow aspiration and core biopsy. Electronically Signed   By: Sandi Mariscal M.D.   On: 11/18/2020 11:18   ECHOCARDIOGRAM COMPLETE  Result Date: 11/19/2020    ECHOCARDIOGRAM REPORT   Patient Name:   Jennifer Cole Date of Exam: 11/19/2020 Medical Rec #:  [id number]        Height:       66.0 in Accession #:    [id number]       Weight:       214.0 lb Date of Birth:  28-Jan-1966        BSA:          2.058 m Patient Age:    37 years         BP:           99/64 mmHg Patient Gender: F                HR:           113 bpm. Exam Location:  [facility] Procedure: 2D Echo, Cardiac Doppler, Color Doppler, Strain Analysis and            Intracardiac Opacification Agent Indications:     Chemo Z09  History:         Patient has no prior history of Echocardiogram examinations.                  Cancer pt. malignant pleural eff.  Sonographer:     Sherrie Sport RDCS (AE) Referring Phys:  [place] Diagnosing Phys: Yolonda Kida MD  Sonographer Comments: Suboptimal parasternal window. Global longitudinal strain was attempted. IMPRESSIONS  1. Left ventricular ejection fraction, by estimation, is 55 to 60%. The left ventricle has normal function. The left ventricle has no regional wall motion abnormalities. Left ventricular diastolic parameters were normal.  2. Right ventricular systolic function is normal. The right ventricular size is normal.  3. The mitral valve is normal in structure. No evidence of mitral valve regurgitation.  4. The aortic valve is normal in structure. Aortic valve regurgitation is not visualized. Conclusion(s)/Recommendation(s): Poor windows for evaluation of left ventricular function by transthoracic echocardiography. Would recommend an alternative means of evaluation. FINDINGS  Left Ventricle: Left ventricular ejection fraction, by estimation, is 55 to 60%. The left ventricle has normal function. The left  ventricle has no regional wall motion abnormalities. Definity contrast agent was given IV to delineate the left ventricular  endocardial borders. The left ventricular internal cavity size was normal in size. There is no left ventricular hypertrophy. Left ventricular diastolic parameters were normal. Right Ventricle: The right ventricular size is normal. No increase in right ventricular wall thickness. Right ventricular systolic function is normal. Left Atrium: Left atrial size was normal in size. Right Atrium: Right atrial size was normal in size. Pericardium: Trivial pericardial effusion is present. Mitral Valve: The mitral valve is normal in structure. No evidence of mitral valve regurgitation. Tricuspid Valve: The tricuspid valve is normal in structure. Tricuspid valve regurgitation is trivial. Aortic Valve: The aortic valve is normal in structure. Aortic valve regurgitation is not visualized. Aortic valve mean gradient measures 3.5 mmHg. Aortic valve peak gradient measures 6.4 mmHg. Aortic valve area, by VTI measures 2.91 cm. Pulmonic Valve: The pulmonic valve was normal in structure. Pulmonic valve regurgitation is not visualized. Aorta: The ascending aorta was not well visualized. IAS/Shunts: No atrial level shunt detected by color flow Doppler.  LEFT VENTRICLE PLAX 2D LVIDd:         3.59 cm  Diastology LVIDs:         2.58 cm  LV e' medial:    11.50 cm/s LV PW:         1.25 cm  LV E/e' medial:  8.0 LV IVS:        0.77 cm  LV e' lateral:   10.30 cm/s LVOT diam:     2.20 cm  LV E/e' lateral: 8.9 LV SV:         52 LV SV Index:   25 LVOT Area:     3.80 cm  RIGHT VENTRICLE RV Basal diam:  2.42 cm RV S prime:     15.60 cm/s TAPSE (M-mode): 4.0 cm LEFT ATRIUM           Index       RIGHT ATRIUM          Index LA diam:      2.40 cm 1.17 cm/m  RA Area:     7.95 cm LA Vol (A2C): 16.0 ml 7.77 ml/m  RA Volume:   13.00 ml 6.32 ml/m LA Vol (A4C): 32.7 ml 15.89 ml/m  AORTIC VALVE AV Area (Vmax):    3.08 cm  AV Area  (Vmean):   2.87 cm AV Area (VTI):     2.91 cm AV Vmax:           126.00 cm/s AV Vmean:          85.100 cm/s AV VTI:            0.180 m AV Peak Grad:      6.4 mmHg AV Mean Grad:      3.5 mmHg LVOT Vmax:         102.00 cm/s LVOT Vmean:        64.300 cm/s LVOT VTI:          0.138 m LVOT/AV VTI ratio: 0.76  AORTA Ao Root diam: 3.35 cm MITRAL VALVE               TRICUSPID VALVE MV Area (PHT): 4.49 cm    TR Peak grad:   16.6 mmHg MV Decel Time: 169 msec    TR Vmax:        204.00 cm/s MV E velocity: 91.70 cm/s MV A velocity: 61.30 cm/s  SHUNTS MV E/A ratio:  1.50        Systemic VTI:  0.14 m                            Systemic Diam: 2.20 cm Dwayne Prince Rome MD Electronically signed by Yolonda Kida MD Signature Date/Time: 11/19/2020/5:59:27 PM    Final    IR PERC PLEURAL DRAIN W/INDWELL CATH W/IMG GUIDE  Result Date: 11/11/2020 CLINICAL DATA:  55 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 EXAM: INSERTION OF TUNNELED right SIDED PLEURAL DRAINAGE CATHETER COMPARISON:  11/09/2020 MEDICATIONS: Ancef 2 gm IV; Antibiotic was administered in an appropriate time interval for the procedure. ANESTHESIA/SEDATION: Moderate (conscious) sedation was employed during this procedure. A total of Versed 3 mg and Fentanyl 150 mcg was administered intravenously. Moderate Sedation Time: 34 minutes. The patient's level of consciousness and vital signs were monitored continuously by radiology nursing throughout the procedure under my direct supervision. FLUOROSCOPY TIME:  FLUOROSCOPY TIME 0.4 min (8.6 mGy) COMPLICATIONS: None immediate. PROCEDURE: The procedure, risks, benefits, and alternatives were explained to the patient, who wishes to proceed with the placement of this permanent pleural catheter as they are seeking palliative care. The patient understands and consents to the procedure. The right lateral chest and upper abdomen were prepped and draped in a sterile fashion, and a sterile drape was applied covering the  operative field. A sterile gown and sterile gloves were used for the procedure. Initial ultrasound scanning and fluoroscopic imaging demonstrates a recurrent moderate to large pleural effusion. Under direct ultrasound guidance, the inferior lateral pleural space was accessed with a Yueh sheath needle after the overlying soft tissues were anesthetized with 1% lidocaine with epinephrine. A Rosen wire was then advanced under fluoroscopy into the pleural space. A 15.5 French tunneled Pleur-X catheter was tunneled from an incision in the right upper abdominal quadrant to the access site. The pleural access site was serially dilated under fluoroscopy, ultimately allowing placement of a peel-away sheath. The catheter was advanced through the peel-away sheath. The sheath was then removed. Final catheter positioning was confirmed with a fluoroscopic radiographic image. The access incision was closed with Dermabond. A 0 silk retention suture was applied at the catheter exit site. Thoracentesis was performed through the new catheter utilizing provided bulb vacuum assisted drainage bag. Dressings were applied. The patient tolerated the above  procedure well without immediate postprocedural complication. FINDINGS: Preprocedural ultrasound scanning demonstrates a recurrent moderate sized right sided pleural effusion. After ultrasound and fluoroscopic guided placement, the catheter is directed superiorly Following catheter placement, approximately 50 cc of translucent, straw-colored pleural fluid was removed. IMPRESSION: Successful placement of permanent, tunneled right pleural drainage catheter via lateral approach. 50 mL of translucent, straw-colored pleural fluid was removed following catheter placement. Ruthann Cancer, MD [facility] Electronically Signed   By: Ruthann Cancer MD   On: 11/11/2020 15:00   US THORACENTESIS ASP PLEURAL SPACE W/IMG GUIDE  Result Date:  11/04/2020 INDICATION: Mediastinal mass. Shortness of breath. Recurrent right pleural effusion. Request for therapeutic thoracentesis. EXAM: ULTRASOUND GUIDED RIGHT THORACENTESIS MEDICATIONS: 1% plain lidocaine, 10 mL COMPLICATIONS: None immediate. PROCEDURE: An ultrasound guided thoracentesis was thoroughly discussed with the patient and questions answered. The benefits, risks, alternatives and complications were also discussed. The patient understands and wishes to proceed with the procedure. Written consent was obtained. Ultrasound was performed to localize and mark an adequate pocket of fluid in the right chest. The area was then prepped and draped in the normal sterile fashion. 1% Lidocaine was used for local anesthesia. Under ultrasound guidance a 6 Fr Safe-T-Centesis catheter was introduced. Thoracentesis was performed. The catheter was removed and a dressing applied. FINDINGS: A total of approximately 900 mL of clear yellow fluid was removed. Ultrasound of the right chest demonstrates right pleural effusion with interval development of loculations. IMPRESSION: Successful ultrasound guided right thoracentesis yielding 900 mL of pleural fluid. Read by: Ascencion Dike PA-C Electronically Signed   By: Markus Daft M.D.   On: 11/04/2020 12:52   US THORACENTESIS ASP PLEURAL SPACE W/IMG GUIDE  Result Date: 10/30/2020 INDICATION: 55 year old with mediastinal mass and recurrent right pleural effusion. EXAM: ULTRASOUND GUIDED RIGHT THORACENTESIS MEDICATIONS: None. COMPLICATIONS: None immediate. PROCEDURE: An ultrasound guided thoracentesis was thoroughly discussed with the patient and questions answered. The benefits, risks, alternatives and complications were also discussed. The patient understands and wishes to proceed with the procedure. Written consent was obtained. Ultrasound was performed to localize and mark an adequate pocket of fluid in the right chest. The area was then prepped and draped in the normal  sterile fashion. 1% Lidocaine was used for local anesthesia. Under ultrasound guidance a 6 Fr Safe-T-Centesis catheter was introduced. Thoracentesis was performed. The catheter was removed and a dressing applied. FINDINGS: A total of approximately 1.3 L of yellow fluid was removed. IMPRESSION: Successful ultrasound guided right thoracentesis yielding 1.3 L of pleural fluid. Electronically Signed   By: Markus Daft M.D.   On: 10/30/2020 13:29   US THORACENTESIS ASP PLEURAL SPACE W/IMG GUIDE  Result Date: 10/23/2020 INDICATION: RIGHT PLEURAL EFFUSION EXAM: ULTRASOUND GUIDED RIGHT THORACENTESIS MEDICATIONS: 1% LIDOCAINE LOCAL COMPLICATIONS: None immediate. PROCEDURE: An ultrasound guided thoracentesis was thoroughly discussed with the patient and questions answered. The benefits, risks, alternatives and complications were also discussed. The patient understands and wishes to proceed with the procedure. Written consent was obtained. Ultrasound was performed to localize and mark an adequate pocket of fluid in the right chest. The area was then prepped and draped in the normal sterile fashion. 1% Lidocaine was used for local anesthesia. Under ultrasound guidance a 6 Fr Safe-T-Centesis catheter was introduced. Thoracentesis was performed. The catheter was removed and a dressing applied. FINDINGS: A total of approximately 1.1 L of CLEAR PLEURAL fluid was removed. Samples were sent to the laboratory as requested by the clinical team. IMPRESSION: Successful ultrasound  guided right thoracentesis yielding 1.1 L of pleural fluid. Electronically Signed   By: Jerilynn Mages.  Shick M.D.   On: 10/23/2020 15:59    ASSESSMENT: Primary mediastinal B-cell lymphoma   PLAN:    1. Primary mediastinal B-cell lymphoma: Imaging and pathology reviewed independently and also discussed with radiology and pathologist.  Bone marrow biopsy results are pending at time of dictation.  Cardiac echo completed on November 19, 2020 revealed an EF of 55 to 60%.   Repeat in June 2022.  Proceed with cycle 1 of 6 of R-CHOP today.  Return to clinic in 2 days for Ziextenzo injection.  Patient with then return to clinic in 1 week for laboratory work and further evaluation and in 3 weeks for further evaluation and consideration of cycle 2.   2.  Shortness of breath/cough: Secondary to pleural effusion from lymphoma.  Patient has Pleurx catheter in place. 3.  Pain: Improved.  Continue current narcotics as prescribed. 4.  Headache: Patient does not complain of this today.  MRI of the brain on October 30, 2020 did not report any significant pathology. 5.  Right arm swelling: Upper extremity ultrasound from today revealed DVT possibly related to her port.  Treat with Eliquis 10 mg twice a day for 7 days and then 5 mg twice a day for at least 6 months.  Okay to use port today for treatment.  Patient expressed understanding and was in agreement with this plan. She also understands that She can call clinic at any time with any questions, concerns, or complaints.   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

## 2020-11-17 ENCOUNTER — Other Ambulatory Visit (INDEPENDENT_AMBULATORY_CARE_PROVIDER_SITE_OTHER): Payer: Self-pay | Admitting: Nurse Practitioner

## 2020-11-18 ENCOUNTER — Other Ambulatory Visit: Payer: Self-pay

## 2020-11-18 ENCOUNTER — Inpatient Hospital Stay: Payer: 59 | Admitting: Nurse Practitioner

## 2020-11-18 ENCOUNTER — Encounter
Admission: RE | Disposition: A | Discharge status: Home / Self Care | Payer: Self-pay | Source: Home / Self Care | Attending: Vascular Surgery

## 2020-11-18 ENCOUNTER — Telehealth: Payer: Self-pay

## 2020-11-18 ENCOUNTER — Other Ambulatory Visit: Payer: 59

## 2020-11-18 ENCOUNTER — Ambulatory Visit
Admission: RE | Admit: 2020-11-18 | Discharge: 2020-11-18 | Disposition: A | Discharge status: Home / Self Care | Payer: 59 | Attending: Vascular Surgery | Admitting: Vascular Surgery

## 2020-11-18 ENCOUNTER — Ambulatory Visit
Admission: RE | Admit: 2020-11-18 | Discharge: 2020-11-18 | Disposition: A | Discharge status: Home / Self Care | Payer: 59 | Source: Ambulatory Visit | Attending: Oncology | Admitting: Oncology

## 2020-11-18 DIAGNOSIS — Z79899 Other long term (current) drug therapy: Secondary | ICD-10-CM | POA: Insufficient documentation

## 2020-11-18 DIAGNOSIS — J9859 Other diseases of mediastinum, not elsewhere classified: Secondary | ICD-10-CM

## 2020-11-18 DIAGNOSIS — C8510 Unspecified B-cell lymphoma, unspecified site: Secondary | ICD-10-CM | POA: Diagnosis not present

## 2020-11-18 DIAGNOSIS — C8590 Non-Hodgkin lymphoma, unspecified, unspecified site: Secondary | ICD-10-CM

## 2020-11-18 DIAGNOSIS — C3490 Malignant neoplasm of unspecified part of unspecified bronchus or lung: Secondary | ICD-10-CM | POA: Diagnosis not present

## 2020-11-18 HISTORY — PX: PORTA CATH INSERTION: CATH118285

## 2020-11-18 LAB — CBC WITH DIFFERENTIAL/PLATELET
Abs Immature Granulocytes: 0.03 10*3/uL (ref 0.00–0.07)
Basophils Absolute: 0.1 10*3/uL (ref 0.0–0.1)
Basophils Relative: 1 %
Eosinophils Absolute: 0.4 10*3/uL (ref 0.0–0.5)
Eosinophils Relative: 7 %
HCT: 36.8 % (ref 36.0–46.0)
Hemoglobin: 11.9 g/dL — ABNORMAL LOW (ref 12.0–15.0)
Immature Granulocytes: 1 %
Lymphocytes Relative: 7 %
Lymphs Abs: 0.4 10*3/uL — ABNORMAL LOW (ref 0.7–4.0)
MCH: 28.6 pg (ref 26.0–34.0)
MCHC: 32.3 g/dL (ref 30.0–36.0)
MCV: 88.5 fL (ref 80.0–100.0)
Monocytes Absolute: 0.7 10*3/uL (ref 0.1–1.0)
Monocytes Relative: 12 %
Neutro Abs: 4.5 10*3/uL (ref 1.7–7.7)
Neutrophils Relative %: 72 %
Platelets: 309 10*3/uL (ref 150–400)
RBC: 4.16 MIL/uL (ref 3.87–5.11)
RDW: 13.7 % (ref 11.5–15.5)
WBC: 6.1 10*3/uL (ref 4.0–10.5)
nRBC: 0 % (ref 0.0–0.2)

## 2020-11-18 SURGERY — PORTA CATH INSERTION
Anesthesia: Moderate Sedation

## 2020-11-18 MED ORDER — MIDAZOLAM HCL 5 MG/5ML IJ SOLN
INTRAMUSCULAR | Status: AC
  Administered 2020-11-18: 1 mg
  Filled 2020-11-18: qty 5

## 2020-11-18 MED ORDER — CHLORHEXIDINE GLUCONATE CLOTH 2 % EX PADS: 6.0000 | MEDICATED_PAD | Freq: Every day | CUTANEOUS | Status: DC

## 2020-11-18 MED ORDER — FENTANYL CITRATE (PF) 100 MCG/2ML IJ SOLN
INTRAMUSCULAR | Status: AC
  Administered 2020-11-18: 25 ug
  Filled 2020-11-18: qty 2

## 2020-11-18 MED ORDER — MIDAZOLAM HCL 2 MG/2ML IJ SOLN
INTRAMUSCULAR | Status: AC | PRN
  Administered 2020-11-18: 1 mg via INTRAVENOUS
  Administered 2020-11-18: 2 mg via INTRAVENOUS

## 2020-11-18 MED ORDER — HYDROMORPHONE HCL 1 MG/ML IJ SOLN: 1.0000 mg | Freq: Once | INTRAMUSCULAR | Status: DC | PRN

## 2020-11-18 MED ORDER — MIDAZOLAM HCL 2 MG/ML PO SYRP: 8.0000 mg | ORAL_SOLUTION | Freq: Once | ORAL | Status: DC | PRN

## 2020-11-18 MED ORDER — MIDAZOLAM HCL 2 MG/2ML IJ SOLN
INTRAMUSCULAR | Status: DC | PRN
  Administered 2020-11-18: 1 mg via INTRAVENOUS

## 2020-11-18 MED ORDER — METHYLPREDNISOLONE SODIUM SUCC 125 MG IJ SOLR
125.0000 mg | Freq: Once | INTRAMUSCULAR | Status: DC | PRN
  Filled 2020-11-18: qty 2

## 2020-11-18 MED ORDER — ONDANSETRON HCL 4 MG/2ML IJ SOLN
4.0000 mg | Freq: Four times a day (QID) | INTRAMUSCULAR | Status: DC | PRN
  Filled 2020-11-18: qty 2

## 2020-11-18 MED ORDER — FENTANYL CITRATE (PF) 100 MCG/2ML IJ SOLN
INTRAMUSCULAR | Status: AC | PRN
  Administered 2020-11-18 (×2): 50 ug via INTRAVENOUS

## 2020-11-18 MED ORDER — SODIUM CHLORIDE 0.9 % IV SOLN: INTRAVENOUS | Status: DC

## 2020-11-18 MED ORDER — SODIUM CHLORIDE 0.9 % IV SOLN
Freq: Once | INTRAVENOUS | Status: DC
  Filled 2020-11-18: qty 2

## 2020-11-18 MED ORDER — DIPHENHYDRAMINE HCL 50 MG/ML IJ SOLN
50.0000 mg | Freq: Once | INTRAMUSCULAR | Status: DC | PRN
  Filled 2020-11-18: qty 1

## 2020-11-18 MED ORDER — HEPARIN SOD (PORK) LOCK FLUSH 100 UNIT/ML IV SOLN
INTRAVENOUS | Status: AC
  Filled 2020-11-18: qty 5

## 2020-11-18 MED ORDER — FAMOTIDINE 40 MG PO TABS
40.0000 mg | ORAL_TABLET | Freq: Once | ORAL | Status: DC | PRN
  Filled 2020-11-18: qty 1

## 2020-11-18 MED ORDER — CEFAZOLIN SODIUM-DEXTROSE 2-4 GM/100ML-% IV SOLN
2.0000 g | Freq: Once | INTRAVENOUS | Status: DC
  Filled 2020-11-18: qty 100

## 2020-11-18 SURGICAL SUPPLY — 8 items
ADH SKN CLS APL DERMABOND .7 (GAUZE/BANDAGES/DRESSINGS) ×1
DERMABOND ADVANCED (GAUZE/BANDAGES/DRESSINGS) ×1
DERMABOND ADVANCED .7 DNX12 (GAUZE/BANDAGES/DRESSINGS) ×1 IMPLANT
KIT PORT POWER 8FR ISP CVUE (Port) ×2 IMPLANT
PACK ANGIOGRAPHY (CUSTOM PROCEDURE TRAY) ×2 IMPLANT
SUT MNCRL AB 4-0 PS2 18 (SUTURE) ×2 IMPLANT
SUT VIC AB 3-0 SH 27 (SUTURE) ×2
SUT VIC AB 3-0 SH 27X BRD (SUTURE) ×1 IMPLANT

## 2020-11-18 NOTE — Interval H&P Note (Signed)
History and Physical Interval Note:  11/18/2020 8:45 AM  Jennifer Cole  has presented today for surgery, with the diagnosis of Porta Cath Placement   Lung Ca Covid  March 3.  The various methods of treatment have been discussed with the patient and family. After consideration of risks, benefits and other options for treatment, the patient has consented to  Procedure(s): PORTA CATH INSERTION (N/A) as a surgical intervention.  The patient's history has been reviewed, patient examined, no change in status, stable for surgery.  I have reviewed the patient's chart and labs.  Questions were answered to the patient's satisfaction.     Leotis Pain

## 2020-11-18 NOTE — Progress Notes (Signed)
Patient clinically stable post BMB per Dr Pascal Lux, tolerated well. Vitals stable pre and post procedure. Received Versed 3 mg along with Fentanyl 100 mcg IV for procedure. Denies complaints at this time. Report given to [facility] post procedure/specials.

## 2020-11-18 NOTE — Telephone Encounter (Signed)
Copied from [place] [id number]. Topic: Appointment Scheduling - Scheduling Inquiry for Clinic >> Nov 15, 2020  3:03 PM Oneta Rack wrote: [name] Human name: Jennifer Cole  Relation to pt: [facility] Oncologist in [place]  Call back number: [phone]   Reason for call:  Seeking to schedule NPA with a provider within 2 weeks due to new diagnosis. Patient was scheduled for April. Please follow up regarding seeing the patient in the next 2 weeks.  Please advice

## 2020-11-18 NOTE — Procedures (Signed)
Pre-procedure Diagnosis: B-cell lymphoma Post-procedure Diagnosis: Same  Technically successful CT guided bone marrow aspiration and biopsy of left iliac crest.   Complications: None Immediate  EBL: None  Signed: Sandi Mariscal Pager: [phone] 11/18/2020, 11:15 AM

## 2020-11-18 NOTE — Op Note (Signed)
      [place] [facility]       Operative Note  Date: 11/18/2020  Preoperative diagnosis:  1. lymphoma  Postoperative diagnosis:  Same as above  Procedures: #1. Ultrasound guidance for vascular access to the right internal jugular vein. #2. Fluoroscopic guidance for placement of catheter. #3. Placement of CT compatible Port-A-Cath, right internal jugular vein.  Surgeon: Leotis Pain, MD.   Anesthesia: Local with moderate conscious sedation for approximately 25  minutes using 2 mg of Versed and 50 mcg of Fentanyl  Fluoroscopy time: less than 1 minute  Contrast used: 0  Estimated blood loss: 15 cc  Indication for the procedure:  The patient is a 55 y.o.female with lymphoma.  The patient needs a Port-A-Cath for durable venous access, chemotherapy, lab draws, and CT scans. We are asked to place this. Risks and benefits were discussed and informed consent was obtained.  Description of procedure: The patient was brought to the vascular and interventional radiology suite.  Moderate conscious sedation was administered throughout the procedure during a face to face encounter with the patient with my supervision of the RN administering medicines and monitoring the patient's vital signs, pulse oximetry, telemetry and mental status throughout from the start of the procedure until the patient was taken to the recovery room. The right neck chest and shoulder were sterilely prepped and draped, and a sterile surgical field was created. Ultrasound was used to help visualize a patent right internal jugular vein. This was then accessed under direct ultrasound guidance without difficulty with the Seldinger needle and a permanent image was recorded. A J-wire was placed. After skin nick and dilatation, the peel-away sheath was then placed over the wire. I then anesthetized an area under the clavicle approximately 1-2 fingerbreadths. A transverse incision was created and an inferior pocket was created  with electrocautery and blunt dissection. The port was then brought onto the field, placed into the pocket and secured to the chest wall with 2 Prolene sutures. The catheter was connected to the port and tunneled from the subclavicular incision to the access site. Fluoroscopic guidance was then used to cut the catheter to an appropriate length. The catheter was then placed through the peel-away sheath and the peel-away sheath was removed. The catheter tip was parked in excellent location under fluorocoscopic guidance in the cavoatrial junction. The pocket was then irrigated with antibiotic impregnated saline and the wound was closed with a running 3-0 Vicryl and a 4-0 Monocryl. The access incision was closed with a single 4-0 Monocryl. The Huber needle was used to withdraw blood and flush the port with heparinized saline. Dermabond was then placed as a dressing. The patient tolerated the procedure well and was taken to the recovery room in stable condition.   Leotis Pain 11/18/2020 11:45 AM   This note was created with Dragon Medical transcription system. Any errors in dictation are purely unintentional.

## 2020-11-18 NOTE — H&P (Signed)
Chief Complaint: Staging for lymphoma. Request is for bone marrow biopsy  Referring Physician(s): Finnegan,Timothy J  Supervising Physician: Sandi Mariscal  Patient Status: [facility] - Out-pt  History of Present Illness: Jennifer Cole is a 55 y.o. female History of  cervical cancer. Jennifer Cole was found to have a mediastinal mass while being worked up for [facility]. 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 Team is requesting a bone marrow biopsy for staging of lymphoma   Patient sitting on the side of the bed tearful and anxious. She is anxious because her family and boyfriend  are not able to be at bedside with her and concern regarding her lymphoma staging and states that she doe snot have 900$ for the pleurX bottles. Ms. Markes states that she has been going to the cancer center for fluid removal. She endorses right sided pleuritic pain and states that it feels like the pleurX catheter is "rubbing against 2 ribs" Denies any fevers, headache, chest pain, SOB, cough, abdominal pain, nausea, vomiting or bleeding.    Past Medical History:  Diagnosis Date  . [facility])    cervical    Past Surgical History:  Procedure Laterality Date  . IR PERC PLEURAL DRAIN W/INDWELL CATH W/IMG GUIDE  11/11/2020    Allergies: Patient has no known allergies.  Medications: Prior to Admission medications   Medication Sig Start Date End Date Taking? Authorizing Provider  acetaminophen (TYLENOL) 500 MG tablet Take 1 tablet (500 mg total) by mouth every 6 (six) hours as needed. 02/06/19   Law, Bea Graff, PA-C  albuterol (VENTOLIN HFA) 108 (90 Base) MCG/ACT inhaler Inhale 2 puffs into the lungs every 6 (six) hours as needed for wheezing or shortness of breath. 10/28/20   Lloyd Huger, MD  allopurinol (ZYLOPRIM) 300 MG tablet Take 1 tablet (300 mg total) by mouth daily. 11/13/20   Lloyd Huger, MD  folic acid (FOLVITE) 1 MG tablet Take 1 mg by mouth daily.    [provider]  ibuprofen (ADVIL) 600 MG tablet Take 1 tablet (600 mg total) by mouth every 6 (six) hours as needed. 02/06/19   Frederica Kuster, PA-C  lidocaine-prilocaine (EMLA) cream Apply to affected area once 11/13/20   Lloyd Huger, MD  ondansetron (ZOFRAN) 8 MG tablet Take 1 tablet (8 mg total) by mouth 2 (two) times daily as needed for refractory nausea / vomiting. 11/13/20   Lloyd Huger, MD  predniSONE (DELTASONE) 20 MG tablet Take 5 tablets (100 mg total) by mouth daily. Take with food on days 1-5 of chemotherapy. 11/13/20   Lloyd Huger, MD  prochlorperazine (COMPAZINE) 10 MG tablet Take 1 tablet (10 mg total) by mouth every 6 (six) hours as needed (Nausea or vomiting). 11/13/20   Lloyd Huger, MD     History reviewed. No pertinent family history.  Social History   Socioeconomic History  . Marital status: Legally Separated    Spouse name: Not on file  . Number of children: Not on file  . Years of education: Not on file  . Highest education level: Not on file  Occupational History  . Not on file  Tobacco Use  . Smoking status: Never Smoker  . Smokeless tobacco: Never Used  Substance and Sexual Activity  . Alcohol use: Never  . Drug use: Not on file  . Sexual activity: Not on file  Other Topics Concern  . Not on file  Social History Narrative  . Not on file  Social Determinants of Health   Financial Resource Strain: Not on file  Food Insecurity: Not on file  Transportation Needs: Not on file  Physical Activity: Not on file  Stress: Not on file  Social Connections: Not on file     Review of Systems: A 12 point ROS discussed and pertinent positives are indicated in the HPI above.  All other systems are negative.  Review of Systems  Constitutional: Negative for fatigue and fever.  HENT: Negative for congestion.   Respiratory: Negative for cough and shortness of breath.        Pain at pluerX catheter site.  Gastrointestinal: Negative for  abdominal pain, diarrhea, nausea and vomiting.    Vital Signs: BP 113/66   Pulse 98   Temp 98.3 F (36.8 C) (Oral)   Resp (!) 28   Ht _0  (1.676 m)   Wt 214 lb (97.1 kg)   SpO2 95%   BMI 34.54 kg/m   Physical Exam Vitals and nursing note reviewed.  Constitutional:      Appearance: She is well-developed.  HENT:     Head: Normocephalic and atraumatic.  Eyes:     Conjunctiva/sclera: Conjunctivae normal.  Cardiovascular:     Rate and Rhythm: Normal rate and regular rhythm.     Heart sounds: Normal heart sounds.  Pulmonary:     Effort: Pulmonary effort is normal.     Breath sounds: Normal breath sounds.     Comments: Right sided pleurex catheter presents. Dressing c/d/i Musculoskeletal:        General: Normal range of motion.     Cervical back: Normal range of motion.  Skin:    General: Skin is warm.  Neurological:     Mental Status: She is alert and oriented to person, place, and time.     Imaging: DG Chest 2 View  Result Date: 11/09/2020 CLINICAL DATA:  Shortness of breath.  History of lung cancer EXAM: CHEST - 2 VIEW COMPARISON:  October 23, 2020, November 04, 2020 FINDINGS: The cardiomediastinal silhouette is unchanged in contour with unchanged irregular RIGHT perihilar contours and opacity of the anterior mediastinum.There is a moderate RIGHT pleural effusion, increased in comparison to prior. Trace LEFT pleural effusion. No pneumothorax. Homogeneous opacification of the RIGHT lung base, likely atelectasis. Status post cholecystectomy. Mild degenerative changes of the thoracic spine. IMPRESSION: 1. Interval increase in size of a moderate RIGHT pleural effusion. 2. Revisualization of malignancy involving the anterior mediastinum and extending along the RIGHT perihilar border. Electronically Signed   By: Valentino Saxon MD   On: 11/09/2020 11:50   DG Chest 2 View  Result Date: 10/28/2020 CLINICAL DATA:  Shortness of breath EXAM: CHEST - 2 VIEW COMPARISON:  Chest  radiograph and chest CT October 23, 2020 FINDINGS: Right pleural effusion, increased in size. Airspace opacity/consolidation with obstruction of the right lower lobe bronchus; CT shows large mass in this area. Left lung is clear. Heart is upper normal in size. Pulmonary vascularity on the left is normal. Pulmonary vascularity on the right appears grossly normal, although there is soft tissue fullness in this area due to adenopathy which is evident on recent CT. No appreciable bone lesions. IMPRESSION: Pleural effusion on the right has increased in size. Extensive mass with consolidation throughout portions of the right middle lobe region with mass arising from the right mediastinum and potentially pericardium. There is right hilar adenopathy, better delineated by CT. Left lung is clear. Stable cardiac silhouette. Electronically Signed   By: Gwyndolyn Saxon  Jasmine December III M.D.   On: 10/28/2020 11:31   DG Chest 2 View  Result Date: 10/23/2020 CLINICAL DATA:  55 year old female with increasing shortness of breath for 2 days. EXAM: CHEST - 2 VIEW COMPARISON:  None. FINDINGS: Moderate size layering right pleural effusion with dense right lung base opacification. No superimposed pulmonary edema or pneumothorax. The left lung appears to remain clear. Possible cardiomegaly. Right hilum and right heart border are obscured. Furthermore, possible tapered appearance of the right bronchus intermedius. Visualized tracheal air column is within normal limits. No acute osseous abnormality identified. Negative visible bowel gas pattern. Surgical clips in the upper abdomen. IMPRESSION: 1. Moderate size right pleural effusion. Underlying mass or pneumonia not excluded. 2. Questionable cardiomegaly.  No overt edema. Electronically Signed   By: Genevie Ann M.D.   On: 10/23/2020 08:51   CT Angio Chest PE W/Cm &/Or Wo Cm  Result Date: 10/23/2020 CLINICAL DATA:  55 year old female with increasing shortness of breath and abnormal chest x-ray this  morning. EXAM: CT ANGIOGRAPHY CHEST WITH CONTRAST TECHNIQUE: Multidetector CT imaging of the chest was performed using the standard protocol during bolus administration of intravenous contrast. Multiplanar CT image reconstructions and MIPs were obtained to evaluate the vascular anatomy. CONTRAST:  55m OMNIPAQUE IOHEXOL 350 MG/ML SOLN COMPARISON:  Chest radiographs 10/23/2020. FINDINGS: Cardiovascular: Adequate contrast bolus timing in the pulmonary arterial tree. Respiratory motion degrades detail of the distal left lower lobe pulmonary arteries. Elsewhere no focal filling defect identified in the pulmonary arteries to suggest acute pulmonary embolism. However, there is a large mediastinal mass which severely narrows the right middle and upper lobe pulmonary artery branches (series 6, image 121). The mass infiltrates the superior mediastinum and is also inseparable from the ascending aorta, some of the proximal great vessels, cardiac base and right heart border. No pericardial effusion. No cardiomegaly. Aorta and proximal great vessels remain patent without irregularity. The mass also severely attenuates the right pulmonary veins. Mediastinum/Nodes: Large, infiltrative mediastinal mass occupying the superior and anterior mediastinum tracking inferiorly on the right. The mass encompasses 9.9 x 14.1 x 14.2 cm (AP by transverse by CC). See series 5, image 51 and coronal series 10, image 30. Slightly Discontinuous ex-nodal disease is noted about the disease great vessels. There is no lymphadenopathy in the visible lower neck or in the axilla. Associated regional mass effect, including on the right airways, see below. Lungs/Pleura: Moderate to large mixed subpleural and layering right pleural effusion with simple fluid density. Compressive atelectasis of virtually the entire right lower lobe. Atelectasis of the right middle lobe is related both to pleural fluid and the large mediastinal mass. There is irregular  narrowing of the bronchus intermedius (series 7, image 42) and the other right hilar airways are also encased by tumor. Major airways do remain patent. In the left lung 2 indeterminate although somewhat sub solid appearing pulmonary nodules are identified in the peripheral upper lobe (series 7, image 28, 4-5 mm) and lower lobe just above the diaphragm (6-7 mm image 71). Trace left pleural fluid. Upper Abdomen: Absent gallbladder. Visible liver, spleen, pancreas, adrenal glands, kidneys and bowel remain within normal limits. Musculoskeletal: No sternal or rib destruction related to the mass. No acute or suspicious osseous lesion is identified. Review of the MIP images confirms the above findings. IMPRESSION: 1. 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 Discontinuous ex-nodal disease in the superior prevascular  space. Top differential considerations include small cell carcinoma, lymphoma, and non-small cell bronchogenic carcinoma. 2. Superimposed moderate to large layering and sub-pulmonic right pleural effusion associated right lung atelectasis. 3. Two small indeterminate left lung pulmonary nodules. 4. Negative for acute pulmonary embolus. No metastatic disease identified in the upper abdomen. Electronically Signed   By: Genevie Ann M.D.   On: 10/23/2020 11:30   MR Brain W Wo Contrast  Result Date: 10/30/2020 CLINICAL DATA:  Staging of recently diagnosed lung cancer. EXAM: MRI HEAD WITHOUT AND WITH CONTRAST TECHNIQUE: Multiplanar, multiecho pulse sequences of the brain and surrounding structures were obtained without and with intravenous contrast. CONTRAST:  26m GADAVIST GADOBUTROL 1 MMOL/ML IV SOLN COMPARISON:  None. FINDINGS: Brain: There is no evidence of an acute infarct, intracranial hemorrhage, mass, midline shift, or extra-axial fluid collection. The ventricles and sulci  are normal. A few punctate foci of T2 hyperintensity in the cerebral white matter are nonspecific and considered to be within normal limits for age. No abnormal enhancement is identified. Vascular: Major intracranial vascular flow voids are preserved. Skull and upper cervical spine: Unremarkable bone marrow signal. Sinuses/Orbits: Unremarkable orbits. Clear paranasal sinuses. Small bilateral mastoid effusions. Other: None. IMPRESSION: Unremarkable appearance of the brain for age. No evidence of intracranial metastases. Electronically Signed   By: ALogan BoresM.D.   On: 10/30/2020 08:17   NM PET Image Initial (PI) Skull Base To Thigh  Result Date: 10/30/2020 CLINICAL DATA:  Initial treatment strategy for mediastinal mass. EXAM: NUCLEAR MEDICINE PET SKULL BASE TO THIGH TECHNIQUE: 11.4 mCi F-18 FDG was injected intravenously. Full-ring PET imaging was performed from the skull base to thigh after the radiotracer. CT data was obtained and used for attenuation correction and anatomic localization. Fasting blood glucose: 72 mg/dl COMPARISON:  CT scan 10/30/2020 FINDINGS: Mediastinal blood pool activity: SUV max 1.97 Liver activity: SUV max NA NECK: No neck mass or adenopathy. Incidental CT findings: none CHEST: Hypermetabolic soft tissue mass noted in the left upper chest wall area with ill-defined soft tissue density which could be a nodal mass measures a maximum of 14 mm and the SUV max is 19.07. Small subclavicular lymph node measures 8 mm on image 63/3 and has an SUV max of 3.37. No breast masses, axillary or subpectoral adenopathy. Large anterior mediastinal mass also invading the middle mediastinum on the right side. The lesion is markedly hypermetabolic with SUV max of 354.27 There is an area of chest wall invasion along the left side of the sternum. There is also a separate pleural nodule on the left side measuring 10 mm with an SUV max of 4.20. A small pleural nodule is also noted posterior to the descending  thoracic aorta on image number 74/3. It measures 10 mm and the SUV max is 6.21. Moderate-sized right pleural effusion is noted but no definite hypermetabolic right-sided pleural nodules. A few small indeterminate pulmonary nodules are also noted. Incidental CT findings: none ABDOMEN/PELVIS: No hypermetabolic solid abdominal organ lesions and no hypermetabolic lymphadenopathy. No inguinal adenopathy. Incidental CT findings: none SKELETON: No osseous lesions are identified. Incidental CT findings: none IMPRESSION: 1. Large markedly hypermetabolic anterior mediastinal mass also involving the middle mediastinum on the right side. Findings suspicious for thymic neoplasm. Lymphoma and germ cell tumors would be other possibilities. The lesion appears to invade the left chest wall along the lateral margin of the sternum. 2. Associated small hypermetabolic pleural nodules on the left side. 3. 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 4. A few small indeterminate pulmonary  nodules are noted. 5. Hypermetabolic soft tissue mass in the left chest wall anterior to the left first rib could represent adenopathy. 6. No findings for abdominal/pelvic metastatic disease or osseous metastatic disease. Electronically Signed   By: Marijo Sanes M.D.   On: 10/30/2020 17:06   CT BIOPSY  Result Date: 11/04/2020 INDICATION: 55 year old with a large mediastinal mass. Tissue diagnosis is needed. EXAM: CT-GUIDED CORE BIOPSY OF MEDIASTINAL MASS MEDICATIONS: None. ANESTHESIA/SEDATION: Moderate (conscious) sedation was employed during this procedure. A total of Versed 2.0 mg and Fentanyl 100 mcg was administered intravenously. Moderate Sedation Time: 16 minutes. The patient's level of consciousness and vital signs were monitored continuously by radiology nursing throughout the procedure under my direct supervision. FLUOROSCOPY TIME:  Fluoroscopy Time: None COMPLICATIONS: None immediate. PROCEDURE: Informed written consent was obtained from the  patient after a thorough discussion of the procedural risks, benefits and alternatives. All questions were addressed. A timeout was performed prior to the initiation of the procedure. Patient was placed supine on the CT scanner. CT images through the chest were obtained. The anterior chest was prepped with chlorhexidine and sterile field was created. Skin and soft tissues were anesthetized with 1% lidocaine. Using CT guidance, a 17 gauge coaxial needle was directed into the anterior mediastinal mass along the left side of the sternum. Needle was directed into the mediastinal mass and needle position was confirmed with CT. Three core biopsies were obtained with an 18 gauge core device. Specimens were placed on Telfa pad with saline. Needle was removed without complication and follow up CT images were obtained. Bandage placed over the puncture site. FINDINGS: Again noted is a large anterior mediastinal mass with low-density along the right side of the mass. Biopsy needle was directed into the mass along the left side of the sternum. Needle position confirmed within the mediastinal mass. No significant bleeding or hematoma formation at the end of the procedure. Small loculated right pleural effusion. Tiny focus of gas within the right pleural effusion related to recent thoracentesis. No significant pleural gas. Again noted is a small nodule in the left upper lobe measuring close to 6 mm. IMPRESSION: CT-guided core biopsy of the anterior mediastinal mass. Electronically Signed   By: Markus Daft M.D.   On: 11/04/2020 15:34   DG Chest Port 1 View  Result Date: 11/04/2020 CLINICAL DATA:  Status post thoracentesis EXAM: PORTABLE CHEST 1 VIEW COMPARISON:  October 30, 2020. FINDINGS: No pneumothorax evident. Small right pleural effusion evident. There is mild right base atelectasis. The lungs elsewhere are clear. There is stable cardiomegaly with pulmonary vascularity normal. No adenopathy. No bone lesions. IMPRESSION: No  appreciable pneumothorax. Small right pleural effusion with right base atelectasis. No consolidation. Stable cardiomegaly. Electronically Signed   By: Lowella Grip III M.D.   On: 11/04/2020 15:15   DG Chest Port 1 View  Result Date: 10/30/2020 CLINICAL DATA:  Status post thoracentesis. EXAM: PORTABLE CHEST 1 VIEW COMPARISON:  10/28/2020 FINDINGS: Interval near complete evacuation of the right pleural fluid collection. No postprocedural pneumothorax is identified. Streaky right basilar atelectasis. IMPRESSION: Near complete evacuation of right pleural fluid collection without postprocedural pneumothorax. Electronically Signed   By: Marijo Sanes M.D.   On: 10/30/2020 13:04   DG Chest Port 1 View  Result Date: 10/23/2020 CLINICAL DATA:  Post thoracentesis EXAM: PORTABLE CHEST 1 VIEW COMPARISON:  CT 10/23/2020 FINDINGS: Interval decrease in the size of the right pleural effusion with some small to moderate volume residual layering fluid in the right  lung base. Suspect at least trace left effusion. Residual atelectatic changes are noted in both lungs. Redemonstration of the irregular heterogeneous mediastinal contours compatible with the infiltrative mediastinal mass seen on comparison CT imaging. Previously seen pulmonary nodules are less well visualized radiographically. No visible pneumothorax. No acute osseous or soft tissue abnormality. Telemetry leads overlie the chest. IMPRESSION: 1. Interval decrease in the size of the right pleural effusion with some small to moderate volume residual layering fluid in the right lung base. No pneumothorax. Suspect at least trace left effusion. 2. Irregular mediastinal margins compatible with the infiltrative mass seen on comparison CT. Electronically Signed   By: Lovena Le M.D.   On: 10/23/2020 16:11   IR PERC PLEURAL DRAIN W/INDWELL CATH W/IMG GUIDE  Result Date: 11/11/2020 CLINICAL DATA:  55 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 EXAM:  INSERTION OF TUNNELED right SIDED PLEURAL DRAINAGE CATHETER COMPARISON:  11/09/2020 MEDICATIONS: Ancef 2 gm IV; Antibiotic was administered in an appropriate time interval for the procedure. ANESTHESIA/SEDATION: Moderate (conscious) sedation was employed during this procedure. A total of Versed 3 mg and Fentanyl 150 mcg was administered intravenously. Moderate Sedation Time: 34 minutes. The patient's level of consciousness and vital signs were monitored continuously by radiology nursing throughout the procedure under my direct supervision. FLUOROSCOPY TIME:  FLUOROSCOPY TIME 0.4 min (8.6 mGy) COMPLICATIONS: None immediate. PROCEDURE: The procedure, risks, benefits, and alternatives were explained to the patient, who wishes to proceed with the placement of this permanent pleural catheter as they are seeking palliative care. The patient understands and consents to the procedure. The right lateral chest and upper abdomen were prepped and draped in a sterile fashion, and a sterile drape was applied covering the operative field. A sterile gown and sterile gloves were used for the procedure. Initial ultrasound scanning and fluoroscopic imaging demonstrates a recurrent moderate to large pleural effusion. Under direct ultrasound guidance, the inferior lateral pleural space was accessed with a Yueh sheath needle after the overlying soft tissues were anesthetized with 1% lidocaine with epinephrine. A Rosen wire was then advanced under fluoroscopy into the pleural space. A 15.5 French tunneled Pleur-X catheter was tunneled from an incision in the right upper abdominal quadrant to the access site. The pleural access site was serially dilated under fluoroscopy, ultimately allowing placement of a peel-away sheath. The catheter was advanced through the peel-away sheath. The sheath was then removed. Final catheter positioning was confirmed with a fluoroscopic radiographic image. The access incision was closed with Dermabond. A 0  silk retention suture was applied at the catheter exit site. Thoracentesis was performed through the new catheter utilizing provided bulb vacuum assisted drainage bag. Dressings were applied. The patient tolerated the above procedure well without immediate postprocedural complication. FINDINGS: Preprocedural ultrasound scanning demonstrates a recurrent moderate sized right sided pleural effusion. After ultrasound and fluoroscopic guided placement, the catheter is directed superiorly Following catheter placement, approximately 50 cc of translucent, straw-colored pleural fluid was removed. IMPRESSION: Successful placement of permanent, tunneled right pleural drainage catheter via lateral approach. 50 mL of translucent, straw-colored pleural fluid was removed following catheter placement. Ruthann Cancer, MD [facility] Electronically Signed   By: Ruthann Cancer MD   On: 11/11/2020 15:00   US THORACENTESIS ASP PLEURAL SPACE W/IMG GUIDE  Result Date: 11/04/2020 INDICATION: Mediastinal mass. Shortness of breath. Recurrent right pleural effusion. Request for therapeutic thoracentesis. EXAM: ULTRASOUND GUIDED RIGHT THORACENTESIS MEDICATIONS: 1% plain lidocaine, 10 mL COMPLICATIONS: None immediate. PROCEDURE: An ultrasound guided thoracentesis  was thoroughly discussed with the patient and questions answered. The benefits, risks, alternatives and complications were also discussed. The patient understands and wishes to proceed with the procedure. Written consent was obtained. Ultrasound was performed to localize and mark an adequate pocket of fluid in the right chest. The area was then prepped and draped in the normal sterile fashion. 1% Lidocaine was used for local anesthesia. Under ultrasound guidance a 6 Fr Safe-T-Centesis catheter was introduced. Thoracentesis was performed. The catheter was removed and a dressing applied. FINDINGS: A total of approximately 900 mL  of clear yellow fluid was removed. Ultrasound of the right chest demonstrates right pleural effusion with interval development of loculations. IMPRESSION: Successful ultrasound guided right thoracentesis yielding 900 mL of pleural fluid. Read by: Ascencion Dike PA-C Electronically Signed   By: Markus Daft M.D.   On: 11/04/2020 12:52   US THORACENTESIS ASP PLEURAL SPACE W/IMG GUIDE  Result Date: 10/30/2020 INDICATION: 55 year old with mediastinal mass and recurrent right pleural effusion. EXAM: ULTRASOUND GUIDED RIGHT THORACENTESIS MEDICATIONS: None. COMPLICATIONS: None immediate. PROCEDURE: An ultrasound guided thoracentesis was thoroughly discussed with the patient and questions answered. The benefits, risks, alternatives and complications were also discussed. The patient understands and wishes to proceed with the procedure. Written consent was obtained. Ultrasound was performed to localize and mark an adequate pocket of fluid in the right chest. The area was then prepped and draped in the normal sterile fashion. 1% Lidocaine was used for local anesthesia. Under ultrasound guidance a 6 Fr Safe-T-Centesis catheter was introduced. Thoracentesis was performed. The catheter was removed and a dressing applied. FINDINGS: A total of approximately 1.3 L of yellow fluid was removed. IMPRESSION: Successful ultrasound guided right thoracentesis yielding 1.3 L of pleural fluid. Electronically Signed   By: Markus Daft M.D.   On: 10/30/2020 13:29   US THORACENTESIS ASP PLEURAL SPACE W/IMG GUIDE  Result Date: 10/23/2020 INDICATION: RIGHT PLEURAL EFFUSION EXAM: ULTRASOUND GUIDED RIGHT THORACENTESIS MEDICATIONS: 1% LIDOCAINE LOCAL COMPLICATIONS: None immediate. PROCEDURE: An ultrasound guided thoracentesis was thoroughly discussed with the patient and questions answered. The benefits, risks, alternatives and complications were also discussed. The patient understands and wishes to proceed with the procedure. Written consent was  obtained. Ultrasound was performed to localize and mark an adequate pocket of fluid in the right chest. The area was then prepped and draped in the normal sterile fashion. 1% Lidocaine was used for local anesthesia. Under ultrasound guidance a 6 Fr Safe-T-Centesis catheter was introduced. Thoracentesis was performed. The catheter was removed and a dressing applied. FINDINGS: A total of approximately 1.1 L of CLEAR PLEURAL fluid was removed. Samples were sent to the laboratory as requested by the clinical team. IMPRESSION: Successful ultrasound guided right thoracentesis yielding 1.1 L of pleural fluid. Electronically Signed   By: Jerilynn Mages.  Shick M.D.   On: 10/23/2020 15:59    Labs:  CBC: Recent Labs    10/23/20 0752 11/09/20 1120 11/10/20 0322  WBC 6.0 5.5 4.9  HGB 12.3 12.7 12.2  HCT 38.4 39.5 38.2  PLT 322 327 337    COAGS: No results for input(s): INR, APTT in the last 8760 hours.  BMP: Recent Labs    10/23/20 0752 11/09/20 1120 11/10/20 0322  NA 141 139 137  K 3.9 4.3 3.8  CL 102 101 103  CO2 _0 GLUCOSE 130* 120* 99  BUN _1 CALCIUM 9.5 9.5 9.3  CREATININE 0.65 0.72 0.63  GFRNONAA >60 >60 >60    LIVER FUNCTION  TESTS: No results for input(s): BILITOT, AST, ALT, ALKPHOS, PROT, ALBUMIN in the last 8760 hours.  Assessment and Plan:  55 y.o. female outpatient. History of  cervical cancer. Ms. Whitmill was found to have a mediastinal mass while being worked up for [facility]. 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 Team is requesting a bone marrow biopsy for staging of lymphoma.   IR placed a right sided  thoracic pleurX catheter on 2.28.22. All labs from today pending. Labs from 2.27.22 and medications are within acceptable parameters. NKDA. Patient has been NPO since midnight.  Risks and benefits of bone marrow biopsy was discussed with the patient and/or patient's family including, but not limited to bleeding, infection, damage to adjacent  structures or low yield requiring additional tests.  All of the questions were answered and there is agreement to proceed.  Consent signed and in chart.   Thank you for this interesting consult.  I greatly enjoyed meeting Shady Padron and look forward to participating in their care.  A copy of this report was sent to the requesting provider on this date.  Electronically Signed: Jacqualine Mau, NP 11/18/2020, 9:06 AM   I spent a total of  30 Minutes   in face to face in clinical consultation, greater than 50% of which was counseling/coordinating care for bone marrow biopsy

## 2020-11-19 ENCOUNTER — Other Ambulatory Visit: Payer: Self-pay

## 2020-11-19 ENCOUNTER — Inpatient Hospital Stay: Payer: 59 | Admitting: Nurse Practitioner

## 2020-11-19 ENCOUNTER — Encounter: Payer: Self-pay | Admitting: *Deleted

## 2020-11-19 ENCOUNTER — Other Ambulatory Visit: Payer: 59

## 2020-11-19 ENCOUNTER — Ambulatory Visit
Admission: RE | Admit: 2020-11-19 | Discharge: 2020-11-19 | Disposition: A | Discharge status: Home / Self Care | Payer: 59 | Source: Ambulatory Visit | Attending: Oncology | Admitting: Oncology

## 2020-11-19 DIAGNOSIS — J910 Malignant pleural effusion: Secondary | ICD-10-CM | POA: Diagnosis not present

## 2020-11-19 DIAGNOSIS — R918 Other nonspecific abnormal finding of lung field: Secondary | ICD-10-CM | POA: Insufficient documentation

## 2020-11-19 LAB — ECHOCARDIOGRAM COMPLETE
AR max vel: 3.08 cm2
AV Area VTI: 2.91 cm2
AV Area mean vel: 2.87 cm2
AV Mean grad: 3.5 mmHg
AV Peak grad: 6.4 mmHg
Ao pk vel: 1.26 m/s
Area-P 1/2: 4.49 cm2
S' Lateral: 2.58 cm

## 2020-11-19 MED ORDER — PERFLUTREN LIPID MICROSPHERE
1.0000 mL | INTRAVENOUS | Status: AC | PRN
  Administered 2020-11-19: 2 mL via INTRAVENOUS
  Filled 2020-11-19: qty 10

## 2020-11-19 NOTE — Progress Notes (Signed)
*  PRELIMINARY RESULTS* Echocardiogram 2D Echocardiogram has been performed.  Sherrie Sport 11/19/2020, 10:46 AM

## 2020-11-19 NOTE — Telephone Encounter (Signed)
First available new patient appt is in may. Will contact cancer center to inform.

## 2020-11-19 NOTE — Progress Notes (Signed)
  Oncology Nurse Navigator Documentation  Navigator Location: [facility] (11/19/20 1300)   )Navigator Encounter Type: Other: (pleurx drainage) (11/19/20 1300)                             Interventions: Other (pleurx drainage) (11/19/20 1300)          Specialty Items/DME: Pleur X catheter (11/19/20 1300)      Met with patient to assist with pleurx catheter drainage. Pt tolerated drainage well. 700cc amber colored fluid drained during visit. New dressing applied - clean, dry, intact. Pt walked to car unassisted and stated feels better after procedure. Nothing further needed at this time.     Time Spent with Patient: 45 (11/19/20 1300)

## 2020-11-20 ENCOUNTER — Inpatient Hospital Stay: Payer: 59

## 2020-11-20 ENCOUNTER — Ambulatory Visit
Admission: RE | Admit: 2020-11-20 | Discharge: 2020-11-20 | Disposition: A | Discharge status: Home / Self Care | Payer: 59 | Source: Ambulatory Visit | Attending: Oncology | Admitting: Oncology

## 2020-11-20 ENCOUNTER — Encounter: Payer: Self-pay | Admitting: Oncology

## 2020-11-20 ENCOUNTER — Inpatient Hospital Stay (HOSPITAL_BASED_OUTPATIENT_CLINIC_OR_DEPARTMENT_OTHER): Payer: 59 | Admitting: Oncology

## 2020-11-20 ENCOUNTER — Encounter: Payer: Self-pay | Admitting: *Deleted

## 2020-11-20 ENCOUNTER — Telehealth: Payer: Self-pay | Admitting: *Deleted

## 2020-11-20 ENCOUNTER — Other Ambulatory Visit (HOSPITAL_COMMUNITY): Payer: Self-pay | Admitting: Oncology

## 2020-11-20 ENCOUNTER — Other Ambulatory Visit: Payer: Self-pay | Admitting: Oncology

## 2020-11-20 VITALS — BP 106/76 | HR 101 | Temp 99.1°F | Resp 20 | Wt 213.2 lb

## 2020-11-20 VITALS — BP 106/65 | HR 96 | Temp 99.5°F | Resp 18

## 2020-11-20 DIAGNOSIS — C8520 Mediastinal (thymic) large B-cell lymphoma, unspecified site: Secondary | ICD-10-CM

## 2020-11-20 DIAGNOSIS — M7989 Other specified soft tissue disorders: Secondary | ICD-10-CM

## 2020-11-20 DIAGNOSIS — R221 Localized swelling, mass and lump, neck: Secondary | ICD-10-CM

## 2020-11-20 DIAGNOSIS — Z5112 Encounter for antineoplastic immunotherapy: Secondary | ICD-10-CM | POA: Diagnosis not present

## 2020-11-20 LAB — COMPREHENSIVE METABOLIC PANEL
ALT: 12 U/L (ref 0–44)
AST: 27 U/L (ref 15–41)
Albumin: 3.5 g/dL (ref 3.5–5.0)
Alkaline Phosphatase: 69 U/L (ref 38–126)
Anion gap: 11 (ref 5–15)
BUN: 15 mg/dL (ref 6–20)
CO2: 27 mmol/L (ref 22–32)
Calcium: 9.2 mg/dL (ref 8.9–10.3)
Chloride: 97 mmol/L — ABNORMAL LOW (ref 98–111)
Creatinine, Ser: 0.52 mg/dL (ref 0.44–1.00)
GFR, Estimated: >60 mL/min (ref >60)
Glucose, Bld: 93 mg/dL (ref 70–99)
Potassium: 4.1 mmol/L (ref 3.5–5.1)
Sodium: 135 mmol/L (ref 135–145)
Total Bilirubin: 0.9 mg/dL (ref 0.3–1.2)
Total Protein: 7.3 g/dL (ref 6.5–8.1)

## 2020-11-20 LAB — CBC WITH DIFFERENTIAL/PLATELET
Abs Immature Granulocytes: 0.02 10*3/uL (ref 0.00–0.07)
Basophils Absolute: 0.1 10*3/uL (ref 0.0–0.1)
Basophils Relative: 1 %
Eosinophils Absolute: 0.3 10*3/uL (ref 0.0–0.5)
Eosinophils Relative: 5 %
HCT: 38 % (ref 36.0–46.0)
Hemoglobin: 12.4 g/dL (ref 12.0–15.0)
Immature Granulocytes: 0 %
Lymphocytes Relative: 6 %
Lymphs Abs: 0.4 10*3/uL — ABNORMAL LOW (ref 0.7–4.0)
MCH: 29 pg (ref 26.0–34.0)
MCHC: 32.6 g/dL (ref 30.0–36.0)
MCV: 88.8 fL (ref 80.0–100.0)
Monocytes Absolute: 0.7 10*3/uL (ref 0.1–1.0)
Monocytes Relative: 11 %
Neutro Abs: 4.5 10*3/uL (ref 1.7–7.7)
Neutrophils Relative %: 77 %
Platelets: 279 10*3/uL (ref 150–400)
RBC: 4.28 MIL/uL (ref 3.87–5.11)
RDW: 13.6 % (ref 11.5–15.5)
WBC: 5.9 10*3/uL (ref 4.0–10.5)
nRBC: 0 % (ref 0.0–0.2)

## 2020-11-20 LAB — HEPATITIS B SURFACE ANTIGEN: Hepatitis B Surface Ag: NONREACTIVE

## 2020-11-20 LAB — HEPATITIS B CORE ANTIBODY, TOTAL: Hep B Core Total Ab: NONREACTIVE

## 2020-11-20 MED ORDER — APIXABAN (ELIQUIS) VTE STARTER PACK (10MG AND 5MG): ORAL_TABLET | ORAL | 0 refills | Status: DC

## 2020-11-20 MED ORDER — SODIUM CHLORIDE 0.9 % IV SOLN
375.0000 mg/m2 | Freq: Once | INTRAVENOUS | Status: AC
  Administered 2020-11-20: 800 mg via INTRAVENOUS
  Filled 2020-11-20: qty 50

## 2020-11-20 MED ORDER — DOXORUBICIN HCL CHEMO IV INJECTION 2 MG/ML
47.0000 mg/m2 | Freq: Once | INTRAVENOUS | Status: AC
  Administered 2020-11-20: 100 mg via INTRAVENOUS
  Filled 2020-11-20: qty 50

## 2020-11-20 MED ORDER — PALONOSETRON HCL INJECTION 0.25 MG/5ML
0.2500 mg | Freq: Once | INTRAVENOUS | Status: AC
  Administered 2020-11-20: 0.25 mg via INTRAVENOUS
  Filled 2020-11-20: qty 5

## 2020-11-20 MED ORDER — PEGFILGRASTIM 6 MG/0.6ML ~~LOC~~ PSKT: 6.0000 mg | PREFILLED_SYRINGE | Freq: Once | SUBCUTANEOUS | Status: DC

## 2020-11-20 MED ORDER — ACETAMINOPHEN 325 MG PO TABS
650.0000 mg | ORAL_TABLET | Freq: Once | ORAL | Status: AC
  Administered 2020-11-20: 650 mg via ORAL
  Filled 2020-11-20: qty 2

## 2020-11-20 MED ORDER — SODIUM CHLORIDE 0.9 % IV SOLN
Freq: Once | INTRAVENOUS | Status: AC
  Filled 2020-11-20: qty 250

## 2020-11-20 MED ORDER — HEPARIN SOD (PORK) LOCK FLUSH 100 UNIT/ML IV SOLN
INTRAVENOUS | Status: AC
  Filled 2020-11-20: qty 5

## 2020-11-20 MED ORDER — SODIUM CHLORIDE 0.9 % IV SOLN
10.0000 mg | Freq: Once | INTRAVENOUS | Status: AC
  Administered 2020-11-20: 10 mg via INTRAVENOUS
  Filled 2020-11-20: qty 10

## 2020-11-20 MED ORDER — HEPARIN SOD (PORK) LOCK FLUSH 100 UNIT/ML IV SOLN
500.0000 [IU] | Freq: Once | INTRAVENOUS | Status: AC | PRN
  Administered 2020-11-20: 500 [IU]
  Filled 2020-11-20: qty 5

## 2020-11-20 MED ORDER — DIPHENHYDRAMINE HCL 50 MG/ML IJ SOLN
25.0000 mg | Freq: Once | INTRAMUSCULAR | Status: AC
  Administered 2020-11-20: 25 mg via INTRAVENOUS

## 2020-11-20 MED ORDER — SODIUM CHLORIDE 0.9 % IV SOLN
Freq: Once | INTRAVENOUS | Status: DC | PRN
  Filled 2020-11-20: qty 250

## 2020-11-20 MED ORDER — SODIUM CHLORIDE 0.9 % IV SOLN
150.0000 mg | Freq: Once | INTRAVENOUS | Status: AC
  Administered 2020-11-20: 150 mg via INTRAVENOUS
  Filled 2020-11-20: qty 150

## 2020-11-20 MED ORDER — SODIUM CHLORIDE 0.9 % IV SOLN
2.0000 mg | Freq: Once | INTRAVENOUS | Status: AC
  Administered 2020-11-20: 2 mg via INTRAVENOUS
  Filled 2020-11-20: qty 2

## 2020-11-20 MED ORDER — DIPHENHYDRAMINE HCL 25 MG PO CAPS
25.0000 mg | ORAL_CAPSULE | Freq: Once | ORAL | Status: AC
  Administered 2020-11-20: 25 mg via ORAL
  Filled 2020-11-20: qty 1

## 2020-11-20 MED ORDER — SODIUM CHLORIDE 0.9 % IV SOLN
750.0000 mg/m2 | Freq: Once | INTRAVENOUS | Status: AC
  Administered 2020-11-20: 1600 mg via INTRAVENOUS
  Filled 2020-11-20: qty 80

## 2020-11-20 NOTE — Telephone Encounter (Signed)
[facility] called with stat results call them at [phone]

## 2020-11-20 NOTE — Progress Notes (Signed)
0914- Pulse Rate: 101. MD, Dr. Grayland Ormond, notified and aware. Per MD order: proceed with scheduled New Adriamycin, Vincristine, Cytoxan, and Ruxience treatment today. Patient has edema surrounding port-a-cath, right side of neck, and right upper extremity. Also, skin surrounding port-a-cath and right side of patients neck is red and ecchymotic. Per [facility], RN: MD, Dr. Grayland Ormond is already aware and is ordering for patient to go for Ultrasound of Carotid and right upper extremity, but okay to proceed with treatment today.  0943- Treatment on hold at this time. Patient going for Ultrasound of Carotid and right upper extremity prior to starting treatment today.  1120- Patient returned to cancer center from ultrasound. [facility], RN, is going to discuss ultrasound results with MD, Dr. Grayland Ormond.  1132- MD, Dr. Grayland Ormond, has reviewed ultrasound results. Per MD, Dr. Grayland Ormond, order: proceed with using port-a-cath for Adriamycin, Vincristine, Cytoxan, and Ruxience treatment today; proceed with scheduled treatment at this time.

## 2020-11-20 NOTE — Addendum Note (Signed)
Addended by: Telford Nab on: 11/20/2020 11:49 AM   Modules accepted: Orders

## 2020-11-20 NOTE — Telephone Encounter (Signed)
Already know and Eliquis has been called in.

## 2020-11-20 NOTE — Addendum Note (Signed)
Addended by: Telford Nab on: 11/20/2020 09:43 AM   Modules accepted: Orders

## 2020-11-20 NOTE — Progress Notes (Signed)
14- Patient states, "My throat is hurting when I swallow." Patient describes the pain as soreness and rates the pain at a level 3 on a 0-10 pain scale. Ruxience infusion stopped. 0.9% Sodium Chloride infusing wide open per allergic reaction protocol. Patient denies any other symptoms at this time. Vital signs remain stable, see flow sheet. MD, Dr. Grayland Ormond, notified via telephone and coming to chair side to evaluate patient.   39- MD, Dr. Grayland Ormond, at chair side. Per MD verbal order: Benadryl 25 mg IV once order entered and administered, see MAR. Also, per MD order: wait 15 minutes post-Benadryl and then re-start Ruxience infusion at most recent infusion rate, 103 ml/hr (250mg /hr), and continue to titrate infusion rate up per protocol.  1707- Patient reports symptom has resolved and states, "my throat is feeling better and doesn't hurt anymore." Patient reports pain level of 0. Vital signs stable, see flow sheet. Patient denies any further issues and wants to restart Ruxience infusion. 0.9% Sodium Chloride infusion stopped.   1711- Ruxience infusion restarted at 103 ml/hr (250mg /hr) per MD, Dr. Grayland Ormond, order.  1945- Patient tolerated remainder of Ruxience infusion well. Patient and vital signs stable. Patient discharged to home.

## 2020-11-20 NOTE — Progress Notes (Signed)
Oncology Nurse Navigator Documentation  Navigator Location: [facility] (11/20/20 1400)   )Navigator Encounter Type: Follow-up Appt;Treatment (11/20/20 1400)                   Treatment Initiated Date: 11/20/20 (11/20/20 1400) Patient Visit Type: MedOnc (11/20/20 1400) Treatment Phase: First Chemo Tx (11/20/20 1400) Barriers/Navigation Needs: Coordination of Care;Transportation;Underinsured (11/20/20 1400)   Interventions: Coordination of Care;Medication Assistance (11/20/20 1400)   Coordination of Care: Appts;Chemo;Radiology (11/20/20 1400)         met with patient during follow up visit with Dr. Grayland Ormond to start chemotherapy. All questions answered during visit. Pt needed ultrasound of RUE before starting treatment today. Pt escorted to [facility] and back to [place] via wheelchair. Reassurance provided to patient throughout clinic visit and treatment. Results of ultrasound discussed with Dr. Grayland Ormond and results/recommendations discussed with patient. 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 Rx sent into [facility] pharmacy and free trail copay card used to pickup prescription without out of pocket expense. Prescription bottle given to patient and reviewed how to take. Pt took first dose of eliquis as prescribed while in clinic. Instructed to take next dose tonight before bed. Pt understands to notify our office if develops any signs of bleeding. Pt to be scheduled for uber/lyft for transport back home by infusion RN, Radonna Ricker, once treatment complete today. Instructed pt to call if has any further questions or needs. Pt verbalized understanding.          Time Spent with Patient: > 120 (11/20/20 1400)

## 2020-11-20 NOTE — Progress Notes (Signed)
Patient here for oncology follow-up appointment, expresses concerns of n/v, left hand swollen, and shortness of breath

## 2020-11-21 ENCOUNTER — Telehealth: Payer: Self-pay

## 2020-11-21 ENCOUNTER — Telehealth: Payer: Self-pay | Admitting: Pharmacy Technician

## 2020-11-21 NOTE — Telephone Encounter (Signed)
Oral Oncology Patient Advocate Encounter   Was successful in obtaining a copay card for Eliquis.  This copay card will make the patients copay $10.00.  The billing information is as follows and has been shared with [facility] outpatient pharmacy.   RxBin: [id number] PCN: [id number] Member ID: [id number] Group ID: [id number]  Expires 09/13/21 (but program may be continued into the next year)   [place] [facility] [place] Phone [phone] Fax [phone] 11/21/2020 1:18 PM

## 2020-11-21 NOTE — Telephone Encounter (Signed)
Telephone call to patient for follow up after receiving first infusion.   Patient states infusion went great except felt it hard to swallow at one time but got more Benadryl and it went away.  States eating good and drinking plenty of fluids.   Denies any nausea or vomiting.  Encouraged patient to call for any concerns or questions.

## 2020-11-22 ENCOUNTER — Inpatient Hospital Stay: Payer: 59

## 2020-11-22 ENCOUNTER — Inpatient Hospital Stay (HOSPITAL_BASED_OUTPATIENT_CLINIC_OR_DEPARTMENT_OTHER): Payer: 59 | Admitting: Oncology

## 2020-11-22 VITALS — BP 96/62 | HR 105 | Temp 99.0°F | Resp 22

## 2020-11-22 DIAGNOSIS — L089 Local infection of the skin and subcutaneous tissue, unspecified: Secondary | ICD-10-CM

## 2020-11-22 DIAGNOSIS — R918 Other nonspecific abnormal finding of lung field: Secondary | ICD-10-CM

## 2020-11-22 DIAGNOSIS — Z5112 Encounter for antineoplastic immunotherapy: Secondary | ICD-10-CM | POA: Diagnosis not present

## 2020-11-22 MED ORDER — AMOXICILLIN-POT CLAVULANATE 875-125 MG PO TABS: 1.0000 | ORAL_TABLET | Freq: Two times a day (BID) | ORAL | 0 refills | Status: DC

## 2020-11-22 NOTE — Progress Notes (Signed)
Symptom Management Consult note [facility]  Telephone:([phone] [phone]  Patient Care Team: Patient, [name] as PCP - General ([place]) Telford Nab, RN as Oncology Nurse Navigator   Name of the patient: Jennifer Cole  [id number]  [birth year]   Date of visit: 11/22/2020   Diagnosis-B-cell lymphoma  Chief complaint/ Reason for visit-skin infection  Heme/Onc history: Jennifer Cole is a 55 year old female with past medical history significant for joint and foot pain, osteoarthritis who is currently being treated for lymphoma by Dr. Grayland Ormond.  She is status post 1 cycle of R-CHOP.  Developed a right arm DVT after having her port placed.  Started on Eliquis.  She also has shortness of breath secondary to recurrent pleural effusion.  Had Pleurx catheter placed and is requiring drainage several times per week.   Interval history-today, patient presents to have her Pleurx catheter drained by [facility] and was noted to have some blistering and scabbing with white drainage coming from a small incision above her Pleurx.  She denies any pain to this incision.  She is only bothered by the shortness of breath and the blisters that were recently discovered after removing the adhesive dressing covering her Pleurx catheter.  She denies a fever or recent illness.  Denies any neurological complaints.  Haley removed 700 cc of amber fluid from her Pleurx.  ECOG FS:1 - Symptomatic but completely ambulatory  Review of systems- Review of Systems  Constitutional: Positive for malaise/fatigue. Negative for chills, fever and weight loss.  HENT: Negative for congestion, ear pain and tinnitus.   Eyes: Negative.  Negative for blurred vision and double vision.  Respiratory: Positive for shortness of breath. Negative for cough and sputum production.   Cardiovascular: Negative.  Negative for chest pain, palpitations and leg swelling.  Gastrointestinal:  Negative.  Negative for abdominal pain, constipation, diarrhea, nausea and vomiting.  Genitourinary: Negative for dysuria, frequency and urgency.  Musculoskeletal: Negative for back pain and falls.  Skin: Positive for rash.       Blisters Purulent drainage above Pleurx catheter  Neurological: Negative.  Negative for weakness and headaches.  Endo/Heme/Allergies: Negative.  Does not bruise/bleed easily.  Psychiatric/Behavioral: Negative for depression. The patient is nervous/anxious. The patient does not have insomnia.      Current treatment-status post 1 cycle of R-CHOP  No Known Allergies   Past Medical History:  Diagnosis Date  . Cancer [facility])    cervical     Past Surgical History:  Procedure Laterality Date  . IR PERC PLEURAL DRAIN W/INDWELL CATH W/IMG GUIDE  11/11/2020  . PORTA CATH INSERTION N/A 11/18/2020   Procedure: PORTA CATH INSERTION;  Surgeon: Algernon Huxley, MD;  Location: [place] [facility];  Service: Cardiovascular;  Laterality: N/A;    Social History   Socioeconomic History  . Marital status: Legally Separated    Spouse name: Not on file  . Number of children: Not on file  . Years of education: Not on file  . Highest education level: Not on file  Occupational History  . Not on file  Tobacco Use  . Smoking status: Never Smoker  . Smokeless tobacco: Never Used  Substance and Sexual Activity  . Alcohol use: Never  . Drug use: Not on file  . Sexual activity: Not on file  Other Topics Concern  . Not on file  Social History Narrative  . Not on file   Social Determinants of Health   Financial Resource Strain:  Not on file  Food Insecurity: Not on file  Transportation Needs: Not on file  Physical Activity: Not on file  Stress: Not on file  Social Connections: Not on file  Intimate Partner Violence: Not on file    No family history on file.   Current Outpatient Medications:  .  amoxicillin-clavulanate (AUGMENTIN) 875-125 MG tablet, Take 1 tablet by  mouth 2 (two) times daily., Disp: 14 tablet, Rfl: 0 .  acetaminophen (TYLENOL) 500 MG tablet, Take 1 tablet (500 mg total) by mouth every 6 (six) hours as needed., Disp: 30 tablet, Rfl: 0 .  albuterol (VENTOLIN HFA) 108 (90 Base) MCG/ACT inhaler, Inhale 2 puffs into the lungs every 6 (six) hours as needed for wheezing or shortness of breath., Disp: 8 g, Rfl: 2 .  allopurinol (ZYLOPRIM) 300 MG tablet, Take 1 tablet (300 mg total) by mouth daily., Disp: 30 tablet, Rfl: 3 .  APIXABAN (ELIQUIS) VTE STARTER PACK (10MG AND 5MG), Take as directed on package: start with two-29m tablets twice daily for 7 days. On day 8, switch to one-516mtablet twice daily., Disp: 1 each, Rfl: 0 .  folic acid (FOLVITE) 1 MG tablet, Take 1 mg by mouth daily., Disp: , Rfl:  .  ibuprofen (ADVIL) 600 MG tablet, Take 1 tablet (600 mg total) by mouth every 6 (six) hours as needed., Disp: 30 tablet, Rfl: 0 .  lidocaine-prilocaine (EMLA) cream, Apply to affected area once, Disp: 30 g, Rfl: 3 .  ondansetron (ZOFRAN) 8 MG tablet, Take 1 tablet (8 mg total) by mouth 2 (two) times daily as needed for refractory nausea / vomiting., Disp: 60 tablet, Rfl: 2 .  predniSONE (DELTASONE) 20 MG tablet, Take 5 tablets (100 mg total) by mouth daily. Take with food on days 1-5 of chemotherapy., Disp: 25 tablet, Rfl: 5 .  prochlorperazine (COMPAZINE) 10 MG tablet, Take 1 tablet (10 mg total) by mouth every 6 (six) hours as needed (Nausea or vomiting)., Disp: 60 tablet, Rfl: 2  Physical exam:  Vitals:   11/22/20 1130 11/22/20 1145  BP: 109/65 96/62  Pulse: (!) 105 (!) 105  Resp: (!) 22   Temp: 99 F (37.2 C)   TempSrc: Tympanic   SpO2: 93%    Physical Exam Constitutional:      Appearance: Normal appearance.  HENT:     Head: Normocephalic and atraumatic.  Eyes:     Pupils: Pupils are equal, round, and reactive to light.  Cardiovascular:     Rate and Rhythm: Normal rate and regular rhythm.     Heart sounds: Normal heart sounds. No murmur  heard.   Pulmonary:     Effort: Pulmonary effort is normal.     Breath sounds: Normal breath sounds. No wheezing.  Abdominal:     General: Bowel sounds are normal. There is no distension.     Palpations: Abdomen is soft.     Tenderness: There is no abdominal tenderness.  Musculoskeletal:        General: Normal range of motion.     Cervical back: Normal range of motion.  Skin:    General: Skin is warm and dry.     Findings: Erythema, rash and wound present. Rash is crusting.     Comments: Blisters from adhesive around Pleurx catheter.  Small area above Pleurx catheter that appears infected with purulent drainage.  Some reddening and hardness palpated surrounding the incision.  Neurological:     Mental Status: She is alert and oriented to person, place, and  time.  Psychiatric:        Judgment: Judgment normal.      CMP Latest Ref Rng & Units 11/20/2020  Glucose 70 - 99 mg/dL 93  BUN 6 - 20 mg/dL 15  Creatinine 0.44 - 1.00 mg/dL 0.52  Sodium 135 - 145 mmol/L 135  Potassium 3.5 - 5.1 mmol/L 4.1  Chloride 98 - 111 mmol/L 97(L)  CO2 22 - 32 mmol/L 27  Calcium 8.9 - 10.3 mg/dL 9.2  Total Protein 6.5 - 8.1 g/dL 7.3  Total Bilirubin 0.3 - 1.2 mg/dL 0.9  Alkaline Phos 38 - 126 U/L 69  AST 15 - 41 U/L 27  ALT 0 - 44 U/L 12   CBC Latest Ref Rng & Units 11/20/2020  WBC 4.0 - 10.5 K/uL 5.9  Hemoglobin 12.0 - 15.0 g/dL 12.4  Hematocrit 36.0 - 46.0 % 38.0  Platelets 150 - 400 K/uL 279    No images are attached to the encounter.  DG Chest 2 View  Result Date: 11/09/2020 CLINICAL DATA:  Shortness of breath.  History of lung cancer EXAM: CHEST - 2 VIEW COMPARISON:  October 23, 2020, November 04, 2020 FINDINGS: The cardiomediastinal silhouette is unchanged in contour with unchanged irregular RIGHT perihilar contours and opacity of the anterior mediastinum.There is a moderate RIGHT pleural effusion, increased in comparison to prior. Trace LEFT pleural effusion. No pneumothorax. Homogeneous  opacification of the RIGHT lung base, likely atelectasis. Status post cholecystectomy. Mild degenerative changes of the thoracic spine. IMPRESSION: 1. Interval increase in size of a moderate RIGHT pleural effusion. 2. Revisualization of malignancy involving the anterior mediastinum and extending along the RIGHT perihilar border. Electronically Signed   By: Valentino Saxon MD   On: 11/09/2020 11:50   DG Chest 2 View  Result Date: 10/28/2020 CLINICAL DATA:  Shortness of breath EXAM: CHEST - 2 VIEW COMPARISON:  Chest radiograph and chest CT October 23, 2020 FINDINGS: Right pleural effusion, increased in size. Airspace opacity/consolidation with obstruction of the right lower lobe bronchus; CT shows large mass in this area. Left lung is clear. Heart is upper normal in size. Pulmonary vascularity on the left is normal. Pulmonary vascularity on the right appears grossly normal, although there is soft tissue fullness in this area due to adenopathy which is evident on recent CT. No appreciable bone lesions. IMPRESSION: Pleural effusion on the right has increased in size. Extensive mass with consolidation throughout portions of the right middle lobe region with mass arising from the right mediastinum and potentially pericardium. There is right hilar adenopathy, better delineated by CT. Left lung is clear. Stable cardiac silhouette. Electronically Signed   By: Lowella Grip III M.D.   On: 10/28/2020 11:31   MR Brain W Wo Contrast  Result Date: 10/30/2020 CLINICAL DATA:  Staging of recently diagnosed lung cancer. EXAM: MRI HEAD WITHOUT AND WITH CONTRAST TECHNIQUE: Multiplanar, multiecho pulse sequences of the brain and surrounding structures were obtained without and with intravenous contrast. CONTRAST:  73m GADAVIST GADOBUTROL 1 MMOL/ML IV SOLN COMPARISON:  None. FINDINGS: Brain: There is no evidence of an acute infarct, intracranial hemorrhage, mass, midline shift, or extra-axial fluid collection. The  ventricles and sulci are normal. A few punctate foci of T2 hyperintensity in the cerebral white matter are nonspecific and considered to be within normal limits for age. No abnormal enhancement is identified. Vascular: Major intracranial vascular flow voids are preserved. Skull and upper cervical spine: Unremarkable bone marrow signal. Sinuses/Orbits: Unremarkable orbits. Clear paranasal sinuses. Small bilateral mastoid effusions.  Other: None. IMPRESSION: Unremarkable appearance of the brain for age. No evidence of intracranial metastases. Electronically Signed   By: Logan Bores M.D.   On: 10/30/2020 08:17   PERIPHERAL VASCULAR CATHETERIZATION  Result Date: 11/18/2020 See op note  NM PET Image Initial (PI) Skull Base To Thigh  Result Date: 10/30/2020 CLINICAL DATA:  Initial treatment strategy for mediastinal mass. EXAM: NUCLEAR MEDICINE PET SKULL BASE TO THIGH TECHNIQUE: 11.4 mCi F-18 FDG was injected intravenously. Full-ring PET imaging was performed from the skull base to thigh after the radiotracer. CT data was obtained and used for attenuation correction and anatomic localization. Fasting blood glucose: 72 mg/dl COMPARISON:  CT scan 10/30/2020 FINDINGS: Mediastinal blood pool activity: SUV max 1.97 Liver activity: SUV max NA NECK: No neck mass or adenopathy. Incidental CT findings: none CHEST: Hypermetabolic soft tissue mass noted in the left upper chest wall area with ill-defined soft tissue density which could be a nodal mass measures a maximum of 14 mm and the SUV max is 19.07. Small subclavicular lymph node measures 8 mm on image 63/3 and has an SUV max of 3.37. No breast masses, axillary or subpectoral adenopathy. Large anterior mediastinal mass also invading the middle mediastinum on the right side. The lesion is markedly hypermetabolic with SUV max of 73.71. There is an area of chest wall invasion along the left side of the sternum. There is also a separate pleural nodule on the left side  measuring 10 mm with an SUV max of 4.20. A small pleural nodule is also noted posterior to the descending thoracic aorta on image number 74/3. It measures 10 mm and the SUV max is 6.21. Moderate-sized right pleural effusion is noted but no definite hypermetabolic right-sided pleural nodules. A few small indeterminate pulmonary nodules are also noted. Incidental CT findings: none ABDOMEN/PELVIS: No hypermetabolic solid abdominal organ lesions and no hypermetabolic lymphadenopathy. No inguinal adenopathy. Incidental CT findings: none SKELETON: No osseous lesions are identified. Incidental CT findings: none IMPRESSION: 1. Large markedly hypermetabolic anterior mediastinal mass also involving the middle mediastinum on the right side. Findings suspicious for thymic neoplasm. Lymphoma and germ cell tumors would be other possibilities. The lesion appears to invade the left chest wall along the lateral margin of the sternum. 2. Associated small hypermetabolic pleural nodules on the left side. 3. 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 4. A few small indeterminate pulmonary nodules are noted. 5. Hypermetabolic soft tissue mass in the left chest wall anterior to the left first rib could represent adenopathy. 6. No findings for abdominal/pelvic metastatic disease or osseous metastatic disease. Electronically Signed   By: Marijo Sanes M.D.   On: 10/30/2020 17:06   US Venous Img Upper Uni Right  Result Date: 11/20/2020 CLINICAL DATA:  Right upper extremity edema.  Evaluate for DVT. EXAM: RIGHT UPPER EXTREMITY VENOUS DOPPLER ULTRASOUND TECHNIQUE: Gray-scale sonography with graded compression, as well as color Doppler and duplex ultrasound were performed to evaluate the upper extremity deep venous system from the level of the subclavian vein and including the jugular, axillary, basilic, radial, ulnar and upper cephalic vein. Spectral Doppler was utilized to evaluate flow at rest and with distal augmentation maneuvers.  COMPARISON:  None. FINDINGS: Contralateral Subclavian Vein: Respiratory phasicity is normal and symmetric with the symptomatic side. No evidence of thrombus. Normal compressibility. Internal Jugular Vein: No evidence of thrombus. Normal compressibility, respiratory phasicity and response to augmentation. Subclavian Vein: No evidence of thrombus. Normal compressibility, respiratory phasicity and response to augmentation. Axillary Vein: No evidence of  thrombus. Normal compressibility, respiratory phasicity and response to augmentation. Cephalic Vein: No evidence of thrombus. Normal compressibility, respiratory phasicity and response to augmentation. Basilic Vein: No evidence of thrombus. Normal compressibility, respiratory phasicity and response to augmentation. Brachial Veins: There is hypoechoic occlusive thrombus involving one of the paired brachial veins (images 21 and 26). The adjacent paired brachial vein appears widely patent. Radial Veins: No evidence of thrombus. Normal compressibility, respiratory phasicity and response to augmentation. Ulnar Veins: No evidence of thrombus. Normal compressibility, respiratory phasicity and response to augmentation. Other Findings:  None visualized. IMPRESSION: Examination is positive for occlusive DVT involving one of the paired right brachial veins. There is no extension of this short-segment occlusive DVT to the more proximal venous system of the right upper extremity Electronically Signed   By: Sandi Mariscal M.D.   On: 11/20/2020 11:13   CT BIOPSY  Result Date: 11/04/2020 INDICATION: 55 year old with a large mediastinal mass. Tissue diagnosis is needed. EXAM: CT-GUIDED CORE BIOPSY OF MEDIASTINAL MASS MEDICATIONS: None. ANESTHESIA/SEDATION: Moderate (conscious) sedation was employed during this procedure. A total of Versed 2.0 mg and Fentanyl 100 mcg was administered intravenously. Moderate Sedation Time: 16 minutes. The patient's level of consciousness and vital signs  were monitored continuously by radiology nursing throughout the procedure under my direct supervision. FLUOROSCOPY TIME:  Fluoroscopy Time: None COMPLICATIONS: None immediate. PROCEDURE: Informed written consent was obtained from the patient after a thorough discussion of the procedural risks, benefits and alternatives. All questions were addressed. A timeout was performed prior to the initiation of the procedure. Patient was placed supine on the CT scanner. CT images through the chest were obtained. The anterior chest was prepped with chlorhexidine and sterile field was created. Skin and soft tissues were anesthetized with 1% lidocaine. Using CT guidance, a 17 gauge coaxial needle was directed into the anterior mediastinal mass along the left side of the sternum. Needle was directed into the mediastinal mass and needle position was confirmed with CT. Three core biopsies were obtained with an 18 gauge core device. Specimens were placed on Telfa pad with saline. Needle was removed without complication and follow up CT images were obtained. Bandage placed over the puncture site. FINDINGS: Again noted is a large anterior mediastinal mass with low-density along the right side of the mass. Biopsy needle was directed into the mass along the left side of the sternum. Needle position confirmed within the mediastinal mass. No significant bleeding or hematoma formation at the end of the procedure. Small loculated right pleural effusion. Tiny focus of gas within the right pleural effusion related to recent thoracentesis. No significant pleural gas. Again noted is a small nodule in the left upper lobe measuring close to 6 mm. IMPRESSION: CT-guided core biopsy of the anterior mediastinal mass. Electronically Signed   By: Markus Daft M.D.   On: 11/04/2020 15:34   DG Chest Port 1 View  Result Date: 11/04/2020 CLINICAL DATA:  Status post thoracentesis EXAM: PORTABLE CHEST 1 VIEW COMPARISON:  October 30, 2020. FINDINGS: No  pneumothorax evident. Small right pleural effusion evident. There is mild right base atelectasis. The lungs elsewhere are clear. There is stable cardiomegaly with pulmonary vascularity normal. No adenopathy. No bone lesions. IMPRESSION: No appreciable pneumothorax. Small right pleural effusion with right base atelectasis. No consolidation. Stable cardiomegaly. Electronically Signed   By: Lowella Grip III M.D.   On: 11/04/2020 15:15   DG Chest Port 1 View  Result Date: 10/30/2020 CLINICAL DATA:  Status post thoracentesis. EXAM: PORTABLE CHEST 1  VIEW COMPARISON:  10/28/2020 FINDINGS: Interval near complete evacuation of the right pleural fluid collection. No postprocedural pneumothorax is identified. Streaky right basilar atelectasis. IMPRESSION: Near complete evacuation of right pleural fluid collection without postprocedural pneumothorax. Electronically Signed   By: Marijo Sanes M.D.   On: 10/30/2020 13:04   CT BONE MARROW BIOPSY & ASPIRATION  Result Date: 11/18/2020 INDICATION: Recent diagnosis of B-cell lymphoma. Please perform CT-guided bone marrow biopsy for tissue diagnostic purposes. EXAM: CT-GUIDED BONE MARROW BIOPSY AND ASPIRATION MEDICATIONS: None ANESTHESIA/SEDATION: Fentanyl 100 mcg IV; Versed 3 mg IV Sedation Time: 12 Minutes; The patient was continuously monitored during the procedure by the interventional radiology nurse under my direct supervision. COMPLICATIONS: None immediate. PROCEDURE: Informed consent was obtained from the patient following an explanation of the procedure, risks, benefits and alternatives. The patient understands, agrees and consents for the procedure. All questions were addressed. A time out was performed prior to the initiation of the procedure. The patient was positioned left lateral decubitus and non-contrast localization CT was performed of the pelvis to demonstrate the iliac marrow spaces. The operative site was prepped and draped in the usual sterile fashion.  Under sterile conditions and local anesthesia, a 22 gauge spinal needle was utilized for procedural planning. Next, an 11 gauge coaxial bone biopsy needle was advanced into the left iliac marrow space. Needle position was confirmed with CT imaging. Initially, a bone marrow aspiration was performed. Next, a bone marrow biopsy was obtained with the 11 gauge outer bone marrow device. The 11 gauge coaxial bone biopsy needle was re-advanced into a slightly different location within the left iliac marrow space, positioning was confirmed with CT imaging and an additional bone marrow biopsy was obtained. The needle was removed and superficial hemostasis was obtained with manual compression. A dressing was applied. The patient tolerated the procedure well without immediate post procedural complication. IMPRESSION: Successful CT guided left iliac bone marrow aspiration and core biopsy. Electronically Signed   By: Sandi Mariscal M.D.   On: 11/18/2020 11:18   ECHOCARDIOGRAM COMPLETE  Result Date: 11/19/2020    ECHOCARDIOGRAM REPORT   Patient Name:   JALYSA SWOPES Date of Exam: 11/19/2020 Medical Rec #:  [id number]        Height:       66.0 in Accession #:    [id number]       Weight:       214.0 lb Date of Birth:  04/12/1966        BSA:          2.058 m Patient Age:    38 years         BP:           99/64 mmHg Patient Gender: F                HR:           113 bpm. Exam Location:  [facility] Procedure: 2D Echo, Cardiac Doppler, Color Doppler, Strain Analysis and            Intracardiac Opacification Agent Indications:     Chemo Z09  History:         Patient has no prior history of Echocardiogram examinations.                  Cancer pt. malignant pleural eff.  Sonographer:     Sherrie Sport RDCS (AE) Referring Phys:  [place] Diagnosing Phys: Yolonda Kida MD  Sonographer Comments: Suboptimal parasternal window.  Global longitudinal strain was attempted. IMPRESSIONS  1. Left ventricular ejection fraction, by  estimation, is 55 to 60%. The left ventricle has normal function. The left ventricle has no regional wall motion abnormalities. Left ventricular diastolic parameters were normal.  2. Right ventricular systolic function is normal. The right ventricular size is normal.  3. The mitral valve is normal in structure. No evidence of mitral valve regurgitation.  4. The aortic valve is normal in structure. Aortic valve regurgitation is not visualized. Conclusion(s)/Recommendation(s): Poor windows for evaluation of left ventricular function by transthoracic echocardiography. Would recommend an alternative means of evaluation. FINDINGS  Left Ventricle: Left ventricular ejection fraction, by estimation, is 55 to 60%. The left ventricle has normal function. The left ventricle has no regional wall motion abnormalities. Definity contrast agent was given IV to delineate the left ventricular  endocardial borders. The left ventricular internal cavity size was normal in size. There is no left ventricular hypertrophy. Left ventricular diastolic parameters were normal. Right Ventricle: The right ventricular size is normal. No increase in right ventricular wall thickness. Right ventricular systolic function is normal. Left Atrium: Left atrial size was normal in size. Right Atrium: Right atrial size was normal in size. Pericardium: Trivial pericardial effusion is present. Mitral Valve: The mitral valve is normal in structure. No evidence of mitral valve regurgitation. Tricuspid Valve: The tricuspid valve is normal in structure. Tricuspid valve regurgitation is trivial. Aortic Valve: The aortic valve is normal in structure. Aortic valve regurgitation is not visualized. Aortic valve mean gradient measures 3.5 mmHg. Aortic valve peak gradient measures 6.4 mmHg. Aortic valve area, by VTI measures 2.91 cm. Pulmonic Valve: The pulmonic valve was normal in structure. Pulmonic valve regurgitation is not visualized. Aorta: The ascending aorta was  not well visualized. IAS/Shunts: No atrial level shunt detected by color flow Doppler.  LEFT VENTRICLE PLAX 2D LVIDd:         3.59 cm  Diastology LVIDs:         2.58 cm  LV e' medial:    11.50 cm/s LV PW:         1.25 cm  LV E/e' medial:  8.0 LV IVS:        0.77 cm  LV e' lateral:   10.30 cm/s LVOT diam:     2.20 cm  LV E/e' lateral: 8.9 LV SV:         52 LV SV Index:   25 LVOT Area:     3.80 cm  RIGHT VENTRICLE RV Basal diam:  2.42 cm RV S prime:     15.60 cm/s TAPSE (M-mode): 4.0 cm LEFT ATRIUM           Index       RIGHT ATRIUM          Index LA diam:      2.40 cm 1.17 cm/m  RA Area:     7.95 cm LA Vol (A2C): 16.0 ml 7.77 ml/m  RA Volume:   13.00 ml 6.32 ml/m LA Vol (A4C): 32.7 ml 15.89 ml/m  AORTIC VALVE AV Area (Vmax):    3.08 cm AV Area (Vmean):   2.87 cm AV Area (VTI):     2.91 cm AV Vmax:           126.00 cm/s AV Vmean:          85.100 cm/s AV VTI:            0.180 m AV Peak Grad:      6.4  mmHg AV Mean Grad:      3.5 mmHg LVOT Vmax:         102.00 cm/s LVOT Vmean:        64.300 cm/s LVOT VTI:          0.138 m LVOT/AV VTI ratio: 0.76  AORTA Ao Root diam: 3.35 cm MITRAL VALVE               TRICUSPID VALVE MV Area (PHT): 4.49 cm    TR Peak grad:   16.6 mmHg MV Decel Time: 169 msec    TR Vmax:        204.00 cm/s MV E velocity: 91.70 cm/s MV A velocity: 61.30 cm/s  SHUNTS MV E/A ratio:  1.50        Systemic VTI:  0.14 m                            Systemic Diam: 2.20 cm Yolonda Kida MD Electronically signed by Yolonda Kida MD Signature Date/Time: 11/19/2020/5:59:27 PM    Final    IR PERC PLEURAL DRAIN W/INDWELL CATH W/IMG GUIDE  Result Date: 11/11/2020 CLINICAL DATA:  55 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 EXAM: INSERTION OF TUNNELED right SIDED PLEURAL DRAINAGE CATHETER COMPARISON:  11/09/2020 MEDICATIONS: Ancef 2 gm IV; Antibiotic was administered in an appropriate time interval for the procedure. ANESTHESIA/SEDATION: Moderate (conscious) sedation was employed during this  procedure. A total of Versed 3 mg and Fentanyl 150 mcg was administered intravenously. Moderate Sedation Time: 34 minutes. The patient's level of consciousness and vital signs were monitored continuously by radiology nursing throughout the procedure under my direct supervision. FLUOROSCOPY TIME:  FLUOROSCOPY TIME 0.4 min (8.6 mGy) COMPLICATIONS: None immediate. PROCEDURE: The procedure, risks, benefits, and alternatives were explained to the patient, who wishes to proceed with the placement of this permanent pleural catheter as they are seeking palliative care. The patient understands and consents to the procedure. The right lateral chest and upper abdomen were prepped and draped in a sterile fashion, and a sterile drape was applied covering the operative field. A sterile gown and sterile gloves were used for the procedure. Initial ultrasound scanning and fluoroscopic imaging demonstrates a recurrent moderate to large pleural effusion. Under direct ultrasound guidance, the inferior lateral pleural space was accessed with a Yueh sheath needle after the overlying soft tissues were anesthetized with 1% lidocaine with epinephrine. A Rosen wire was then advanced under fluoroscopy into the pleural space. A 15.5 French tunneled Pleur-X catheter was tunneled from an incision in the right upper abdominal quadrant to the access site. The pleural access site was serially dilated under fluoroscopy, ultimately allowing placement of a peel-away sheath. The catheter was advanced through the peel-away sheath. The sheath was then removed. Final catheter positioning was confirmed with a fluoroscopic radiographic image. The access incision was closed with Dermabond. A 0 silk retention suture was applied at the catheter exit site. Thoracentesis was performed through the new catheter utilizing provided bulb vacuum assisted drainage bag. Dressings were applied. The patient tolerated the above procedure well without immediate  postprocedural complication. FINDINGS: Preprocedural ultrasound scanning demonstrates a recurrent moderate sized right sided pleural effusion. After ultrasound and fluoroscopic guided placement, the catheter is directed superiorly Following catheter placement, approximately 50 cc of translucent, straw-colored pleural fluid was removed. IMPRESSION: Successful placement of permanent, tunneled right pleural drainage catheter via lateral approach. 50 mL of translucent, straw-colored pleural fluid was removed following catheter  placement. Ruthann Cancer, MD [facility] Electronically Signed   By: Ruthann Cancer MD   On: 11/11/2020 15:00   US THORACENTESIS ASP PLEURAL SPACE W/IMG GUIDE  Result Date: 11/04/2020 INDICATION: Mediastinal mass. Shortness of breath. Recurrent right pleural effusion. Request for therapeutic thoracentesis. EXAM: ULTRASOUND GUIDED RIGHT THORACENTESIS MEDICATIONS: 1% plain lidocaine, 10 mL COMPLICATIONS: None immediate. PROCEDURE: An ultrasound guided thoracentesis was thoroughly discussed with the patient and questions answered. The benefits, risks, alternatives and complications were also discussed. The patient understands and wishes to proceed with the procedure. Written consent was obtained. Ultrasound was performed to localize and mark an adequate pocket of fluid in the right chest. The area was then prepped and draped in the normal sterile fashion. 1% Lidocaine was used for local anesthesia. Under ultrasound guidance a 6 Fr Safe-T-Centesis catheter was introduced. Thoracentesis was performed. The catheter was removed and a dressing applied. FINDINGS: A total of approximately 900 mL of clear yellow fluid was removed. Ultrasound of the right chest demonstrates right pleural effusion with interval development of loculations. IMPRESSION: Successful ultrasound guided right thoracentesis yielding 900 mL of pleural fluid. Read by: Ascencion Dike PA-C Electronically Signed   By: Markus Daft M.D.   On: 11/04/2020 12:52   US THORACENTESIS ASP PLEURAL SPACE W/IMG GUIDE  Result Date: 10/30/2020 INDICATION: 55 year old with mediastinal mass and recurrent right pleural effusion. EXAM: ULTRASOUND GUIDED RIGHT THORACENTESIS MEDICATIONS: None. COMPLICATIONS: None immediate. PROCEDURE: An ultrasound guided thoracentesis was thoroughly discussed with the patient and questions answered. The benefits, risks, alternatives and complications were also discussed. The patient understands and wishes to proceed with the procedure. Written consent was obtained. Ultrasound was performed to localize and mark an adequate pocket of fluid in the right chest. The area was then prepped and draped in the normal sterile fashion. 1% Lidocaine was used for local anesthesia. Under ultrasound guidance a 6 Fr Safe-T-Centesis catheter was introduced. Thoracentesis was performed. The catheter was removed and a dressing applied. FINDINGS: A total of approximately 1.3 L of yellow fluid was removed. IMPRESSION: Successful ultrasound guided right thoracentesis yielding 1.3 L of pleural fluid. Electronically Signed   By: Markus Daft M.D.   On: 10/30/2020 13:29     Assessment and plan- Patient is a 55 y.o. female who presents to symptom management for a possible skin infection.  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 -MRI of brain was negative for mets, PET scan showed malignancy findings as above but negative for abdominal/pelvic metastatic disease or bony disease. -Had CT-guided biopsy on 221-flow cytometry was low viability which showed likely neoplastic B-cell process. -She required immediate paracentesis and fluid was inconclusive. -Had Pleurx placed secondary to frequent  paracentesis. -Completed first cycle of R-CHOP on 11/20/2020. -She had a Port-A-Cath placed on 11/18/2020. -She is scheduled to return to clinic on 11/28/2020 to assess tolerance of first cycle.  Skin infection/blisters: -Unsure of where the incision came from (located directly above Pleurx catheter) but it does appear infected with purulent drainage and erythema surrounding with hardening.  There are also blisters which appears to be due to medical tape.  -Patient denies any pain.  There is some heat to it. -Recommend Augmentin x7 days for coverage.  Disposition: -RTC as scheduled for Pleurx catheter drainage and next cycle of R-CHOP. -She will be seen next week  11/28/2020 for follow-up and lab work.   Visit Diagnosis 1. Mass of right lung     Patient expressed understanding and was in agreement with this plan. She also understands that She can call clinic at any time with any questions, concerns, or complaints.   Greater than 50% was spent in counseling and coordination of care with this patient including but not limited to discussion of the relevant topics above (See A&P) including, but not limited to diagnosis and management of acute and chronic medical conditions.   Thank you for allowing me to participate in the care of this very pleasant patient.    Jacquelin Hawking, NP [facility] Cell - [phone] Pager- [phone] 11/25/2020 2:13 PM

## 2020-11-22 NOTE — Progress Notes (Signed)
Assisted pt in [facility] for shortness of breath. Pt has pleurx catheter in place in right chest wall. Dressing clean, dry, and intact. Assisted pt with drainage in clinic. 700cc amber fluid removed. Pt tolerated well. Redness, swelling, and purulent drainage noted at small incision above pleurx. Sonia Baller made aware. New dressing applied. Pt discharged from clinic via wheelchair without complaints.

## 2020-11-26 ENCOUNTER — Other Ambulatory Visit: Payer: Self-pay | Admitting: Oncology

## 2020-11-26 ENCOUNTER — Encounter (HOSPITAL_COMMUNITY): Payer: Self-pay | Admitting: Oncology

## 2020-11-27 ENCOUNTER — Inpatient Hospital Stay: Payer: 59 | Attending: Oncology

## 2020-11-27 ENCOUNTER — Encounter: Payer: Self-pay | Admitting: *Deleted

## 2020-11-27 ENCOUNTER — Ambulatory Visit (HOSPITAL_BASED_OUTPATIENT_CLINIC_OR_DEPARTMENT_OTHER): Payer: 59 | Admitting: Oncology

## 2020-11-27 ENCOUNTER — Other Ambulatory Visit: Payer: Self-pay

## 2020-11-27 VITALS — BP 108/68 | HR 103 | Temp 99.2°F | Resp 18

## 2020-11-27 DIAGNOSIS — C8520 Mediastinal (thymic) large B-cell lymphoma, unspecified site: Secondary | ICD-10-CM

## 2020-11-27 DIAGNOSIS — R112 Nausea with vomiting, unspecified: Secondary | ICD-10-CM

## 2020-11-27 DIAGNOSIS — D709 Neutropenia, unspecified: Secondary | ICD-10-CM | POA: Insufficient documentation

## 2020-11-27 DIAGNOSIS — K1379 Other lesions of oral mucosa: Secondary | ICD-10-CM | POA: Insufficient documentation

## 2020-11-27 LAB — COMPREHENSIVE METABOLIC PANEL
ALT: 15 U/L (ref 0–44)
AST: 23 U/L (ref 15–41)
Albumin: 3.7 g/dL (ref 3.5–5.0)
Alkaline Phosphatase: 72 U/L (ref 38–126)
Anion gap: 10 (ref 5–15)
BUN: 18 mg/dL (ref 6–20)
CO2: 28 mmol/L (ref 22–32)
Calcium: 8.7 mg/dL — ABNORMAL LOW (ref 8.9–10.3)
Chloride: 98 mmol/L (ref 98–111)
Creatinine, Ser: 0.51 mg/dL (ref 0.44–1.00)
GFR, Estimated: >60 mL/min (ref >60)
Glucose, Bld: 101 mg/dL — ABNORMAL HIGH (ref 70–99)
Potassium: 3.6 mmol/L (ref 3.5–5.1)
Sodium: 136 mmol/L (ref 135–145)
Total Bilirubin: 0.7 mg/dL (ref 0.3–1.2)
Total Protein: 7 g/dL (ref 6.5–8.1)

## 2020-11-27 LAB — CBC WITH DIFFERENTIAL/PLATELET
Abs Immature Granulocytes: 0 10*3/uL (ref 0.00–0.07)
Band Neutrophils: 1 %
Basophils Absolute: 0 10*3/uL (ref 0.0–0.1)
Basophils Relative: 0 %
Eosinophils Absolute: 0.2 10*3/uL (ref 0.0–0.5)
Eosinophils Relative: 7 %
HCT: 39.7 % (ref 36.0–46.0)
Hemoglobin: 13.1 g/dL (ref 12.0–15.0)
Lymphocytes Relative: 5 %
Lymphs Abs: 0.2 10*3/uL — ABNORMAL LOW (ref 0.7–4.0)
MCH: 28.2 pg (ref 26.0–34.0)
MCHC: 33 g/dL (ref 30.0–36.0)
MCV: 85.6 fL (ref 80.0–100.0)
Monocytes Absolute: 0 10*3/uL — ABNORMAL LOW (ref 0.1–1.0)
Monocytes Relative: 1 %
Neutro Abs: 3 10*3/uL (ref 1.7–7.7)
Neutrophils Relative %: 86 %
Platelets: 250 10*3/uL (ref 150–400)
RBC: 4.64 MIL/uL (ref 3.87–5.11)
RDW: 13.1 % (ref 11.5–15.5)
Smear Review: NORMAL
WBC Morphology: ABNORMAL
WBC: 3.4 10*3/uL — ABNORMAL LOW (ref 4.0–10.5)
nRBC: 0 % (ref 0.0–0.2)

## 2020-11-27 LAB — SURGICAL PATHOLOGY

## 2020-11-27 MED ORDER — SODIUM CHLORIDE 0.9% FLUSH
10.0000 mL | Freq: Once | INTRAVENOUS | Status: AC
  Administered 2020-11-27: 10 mL via INTRAVENOUS
  Filled 2020-11-27: qty 10

## 2020-11-27 MED ORDER — HEPARIN SOD (PORK) LOCK FLUSH 100 UNIT/ML IV SOLN
500.0000 [IU] | Freq: Once | INTRAVENOUS | Status: AC
  Administered 2020-11-27: 500 [IU] via INTRAVENOUS
  Filled 2020-11-27: qty 5

## 2020-11-27 MED ORDER — MAGIC MOUTHWASH: 5.0000 mL | Freq: Four times a day (QID) | ORAL | 2 refills | Status: DC

## 2020-11-27 MED ORDER — PREDNISONE 20 MG PO TABS: 100.0000 mg | ORAL_TABLET | Freq: Every day | ORAL | 4 refills | Status: DC

## 2020-11-27 NOTE — Progress Notes (Signed)
Symptom Management Consult note [facility]  [phone] [phone]  Patient Care Team: Patient, [name] as PCP - General ([place]) Telford Nab, RN as Oncology Nurse Navigator   Name of the patient: Jennifer Cole  [id number]  1966-07-22   Date of visit: 11/27/2020   Diagnosis- 1. Mediastinal (thymic) large B-cell lymphoma, unspecified body region [facility]) - Comprehensive metabolic panel - CBC with Differential   Chief complaint/ Reason for visit- Nausea and vomiting  Heme/Onc history: Mrs. Cuello is a 55 year old female with past medical history significant for joint and foot pain, osteoarthritis who is currently being treated for lymphoma by Dr. Grayland Ormond.  She is status post 1 cycle of R-CHOP.  Developed a right arm DVT after having her port placed.  Started on Eliquis.  She also has shortness of breath secondary to recurrent pleural effusion.  Had Pleurx catheter placed and is requiring drainage several times per week.   Interval history-she presents to symptom management today for concerns of nausea and vomiting.  She had her first cycle of R-CHOP on 11/20/2020.  Symptoms started about 36 hours ago.  Last episode of vomiting was this morning at 5 AM. She has been taking her antiemetics as prescribed.  Last dose was right after she vomited this morning. Denies any neurologic complaints. Denies recent fevers or illnesses. Denies any easy bleeding or bruising.  Appetite has been fair.  She is able to keep down fluids.  Denies any diarrhea or constipation.  ECOG FS:2 - Symptomatic, <50% confined to bed  Review of systems- Review of Systems  Constitutional: Negative.  Negative for chills, fever, malaise/fatigue and weight loss.  HENT: Negative for congestion, ear pain and tinnitus.   Eyes: Negative.  Negative for blurred vision and double vision.  Respiratory: Negative.  Negative for cough, sputum production and shortness of  breath.   Cardiovascular: Negative.  Negative for chest pain, palpitations and leg swelling.  Gastrointestinal: Positive for nausea and vomiting. Negative for abdominal pain, constipation and diarrhea.  Genitourinary: Negative for dysuria, frequency and urgency.  Musculoskeletal: Negative for back pain and falls.  Skin: Negative.  Negative for rash.  Neurological: Negative.  Negative for weakness and headaches.  Endo/Heme/Allergies: Negative.  Does not bruise/bleed easily.  Psychiatric/Behavioral: Negative.  Negative for depression. The patient is not nervous/anxious and does not have insomnia.      Current treatment-status post cycle 1 R-CHOP 11/20/2020  No Known Allergies   Past Medical History:  Diagnosis Date  . Cancer [facility])    cervical     Past Surgical History:  Procedure Laterality Date  . IR PERC PLEURAL DRAIN W/INDWELL CATH W/IMG GUIDE  11/11/2020  . PORTA CATH INSERTION N/A 11/18/2020   Procedure: PORTA CATH INSERTION;  Surgeon: Algernon Huxley, MD;  Location: [facility];  Service: Cardiovascular;  Laterality: N/A;    Social History   Socioeconomic History  . Marital status: Legally Separated    Spouse name: Not on file  . Number of children: Not on file  . Years of education: Not on file  . Highest education level: Not on file  Occupational History  . Not on file  Tobacco Use  . Smoking status: Never Smoker  . Smokeless tobacco: Never Used  Substance and Sexual Activity  . Alcohol use: Never  . Drug use: Not on file  . Sexual activity: Not on file  Other Topics Concern  . Not on file  Social History Narrative  .  Not on file   Social Determinants of Health   Financial Resource Strain: Not on file  Food Insecurity: Not on file  Transportation Needs: Not on file  Physical Activity: Not on file  Stress: Not on file  Social Connections: Not on file  Intimate Partner Violence: Not on file    No family history on file.   Current Outpatient  Medications:  .  acetaminophen (TYLENOL) 500 MG tablet, Take 1 tablet (500 mg total) by mouth every 6 (six) hours as needed., Disp: 30 tablet, Rfl: 0 .  albuterol (VENTOLIN HFA) 108 (90 Base) MCG/ACT inhaler, Inhale 2 puffs into the lungs every 6 (six) hours as needed for wheezing or shortness of breath., Disp: 8 g, Rfl: 2 .  allopurinol (ZYLOPRIM) 300 MG tablet, Take 1 tablet (300 mg total) by mouth daily., Disp: 30 tablet, Rfl: 3 .  amoxicillin-clavulanate (AUGMENTIN) 875-125 MG tablet, Take 1 tablet by mouth 2 (two) times daily., Disp: 14 tablet, Rfl: 0 .  APIXABAN (ELIQUIS) VTE STARTER PACK (10MG AND 5MG), Take as directed on package: start with two-65m tablets twice daily for 7 days. On day 8, switch to one-539mtablet twice daily., Disp: 1 each, Rfl: 0 .  folic acid (FOLVITE) 1 MG tablet, Take 1 mg by mouth daily., Disp: , Rfl:  .  ibuprofen (ADVIL) 600 MG tablet, Take 1 tablet (600 mg total) by mouth every 6 (six) hours as needed., Disp: 30 tablet, Rfl: 0 .  lidocaine-prilocaine (EMLA) cream, Apply to affected area once, Disp: 30 g, Rfl: 3 .  magic mouthwash SOLN, Take 5 mLs by mouth 4 (four) times daily., Disp: 240 mL, Rfl: 2 .  ondansetron (ZOFRAN) 8 MG tablet, Take 1 tablet (8 mg total) by mouth 2 (two) times daily as needed for refractory nausea / vomiting., Disp: 60 tablet, Rfl: 2 .  prochlorperazine (COMPAZINE) 10 MG tablet, Take 1 tablet (10 mg total) by mouth every 6 (six) hours as needed (Nausea or vomiting)., Disp: 60 tablet, Rfl: 2 .  diphenoxylate-atropine (LOMOTIL) 2.5-0.025 MG tablet, Take 1 tablet by mouth 4 (four) times daily as needed for diarrhea or loose stools., Disp: 30 tablet, Rfl: 1 .  predniSONE (DELTASONE) 20 MG tablet, Take 5 tablets (100 mg total) by mouth daily. Take with food on days 1-5 of chemotherapy., Disp: 25 tablet, Rfl: 4  Physical exam:  Vitals:   11/27/20 1339  BP: 108/68  Pulse: (!) 103  Resp: 18  Temp: 99.2 F (37.3 C)  TempSrc: Tympanic  SpO2:  95%   Physical Exam Constitutional:      Appearance: Normal appearance.  HENT:     Head: Normocephalic and atraumatic.  Eyes:     Pupils: Pupils are equal, round, and reactive to light.  Cardiovascular:     Rate and Rhythm: Normal rate and regular rhythm.     Heart sounds: Normal heart sounds. No murmur heard.   Pulmonary:     Effort: Pulmonary effort is normal.     Breath sounds: Normal breath sounds. No wheezing.  Abdominal:     General: Bowel sounds are normal. There is no distension.     Palpations: Abdomen is soft.     Tenderness: There is no abdominal tenderness.  Musculoskeletal:        General: Normal range of motion.     Cervical back: Normal range of motion.  Skin:    General: Skin is warm and dry.     Findings: No rash.  Neurological:  Mental Status: She is alert and oriented to person, place, and time.  Psychiatric:        Judgment: Judgment normal.      CMP Latest Ref Rng & Units 11/27/2020  Glucose 70 - 99 mg/dL 101(H)  BUN 6 - 20 mg/dL 18  Creatinine 0.44 - 1.00 mg/dL 0.51  Sodium 135 - 145 mmol/L 136  Potassium 3.5 - 5.1 mmol/L 3.6  Chloride 98 - 111 mmol/L 98  CO2 22 - 32 mmol/L 28  Calcium 8.9 - 10.3 mg/dL 8.7(L)  Total Protein 6.5 - 8.1 g/dL 7.0  Total Bilirubin 0.3 - 1.2 mg/dL 0.7  Alkaline Phos 38 - 126 U/L 72  AST 15 - 41 U/L 23  ALT 0 - 44 U/L 15   CBC Latest Ref Rng & Units 11/27/2020  WBC 4.0 - 10.5 K/uL 3.4(L)  Hemoglobin 12.0 - 15.0 g/dL 13.1  Hematocrit 36.0 - 46.0 % 39.7  Platelets 150 - 400 K/uL 250    No images are attached to the encounter.  DG Chest 2 View  Result Date: 11/09/2020 CLINICAL DATA:  Shortness of breath.  History of lung cancer EXAM: CHEST - 2 VIEW COMPARISON:  October 23, 2020, November 04, 2020 FINDINGS: The cardiomediastinal silhouette is unchanged in contour with unchanged irregular RIGHT perihilar contours and opacity of the anterior mediastinum.There is a moderate RIGHT pleural effusion, increased in  comparison to prior. Trace LEFT pleural effusion. No pneumothorax. Homogeneous opacification of the RIGHT lung base, likely atelectasis. Status post cholecystectomy. Mild degenerative changes of the thoracic spine. IMPRESSION: 1. Interval increase in size of a moderate RIGHT pleural effusion. 2. Revisualization of malignancy involving the anterior mediastinum and extending along the RIGHT perihilar border. Electronically Signed   By: Valentino Saxon MD   On: 11/09/2020 11:50   PERIPHERAL VASCULAR CATHETERIZATION  Result Date: 11/18/2020 See op note  US Venous Img Upper Uni Right  Result Date: 11/20/2020 CLINICAL DATA:  Right upper extremity edema.  Evaluate for DVT. EXAM: RIGHT UPPER EXTREMITY VENOUS DOPPLER ULTRASOUND TECHNIQUE: Gray-scale sonography with graded compression, as well as color Doppler and duplex ultrasound were performed to evaluate the upper extremity deep venous system from the level of the subclavian vein and including the jugular, axillary, basilic, radial, ulnar and upper cephalic vein. Spectral Doppler was utilized to evaluate flow at rest and with distal augmentation maneuvers. COMPARISON:  None. FINDINGS: Contralateral Subclavian Vein: Respiratory phasicity is normal and symmetric with the symptomatic side. No evidence of thrombus. Normal compressibility. Internal Jugular Vein: No evidence of thrombus. Normal compressibility, respiratory phasicity and response to augmentation. Subclavian Vein: No evidence of thrombus. Normal compressibility, respiratory phasicity and response to augmentation. Axillary Vein: No evidence of thrombus. Normal compressibility, respiratory phasicity and response to augmentation. Cephalic Vein: No evidence of thrombus. Normal compressibility, respiratory phasicity and response to augmentation. Basilic Vein: No evidence of thrombus. Normal compressibility, respiratory phasicity and response to augmentation. Brachial Veins: There is hypoechoic occlusive  thrombus involving one of the paired brachial veins (images 21 and 26). The adjacent paired brachial vein appears widely patent. Radial Veins: No evidence of thrombus. Normal compressibility, respiratory phasicity and response to augmentation. Ulnar Veins: No evidence of thrombus. Normal compressibility, respiratory phasicity and response to augmentation. Other Findings:  None visualized. IMPRESSION: Examination is positive for occlusive DVT involving one of the paired right brachial veins. There is no extension of this short-segment occlusive DVT to the more proximal venous system of the right upper extremity Electronically Signed   By:  Sandi Mariscal M.D.   On: 11/20/2020 11:13   CT BIOPSY  Result Date: 11/04/2020 INDICATION: 55 year old with a large mediastinal mass. Tissue diagnosis is needed. EXAM: CT-GUIDED CORE BIOPSY OF MEDIASTINAL MASS MEDICATIONS: None. ANESTHESIA/SEDATION: Moderate (conscious) sedation was employed during this procedure. A total of Versed 2.0 mg and Fentanyl 100 mcg was administered intravenously. Moderate Sedation Time: 16 minutes. The patient's level of consciousness and vital signs were monitored continuously by radiology nursing throughout the procedure under my direct supervision. FLUOROSCOPY TIME:  Fluoroscopy Time: None COMPLICATIONS: None immediate. PROCEDURE: Informed written consent was obtained from the patient after a thorough discussion of the procedural risks, benefits and alternatives. All questions were addressed. A timeout was performed prior to the initiation of the procedure. Patient was placed supine on the CT scanner. CT images through the chest were obtained. The anterior chest was prepped with chlorhexidine and sterile field was created. Skin and soft tissues were anesthetized with 1% lidocaine. Using CT guidance, a 17 gauge coaxial needle was directed into the anterior mediastinal mass along the left side of the sternum. Needle was directed into the mediastinal  mass and needle position was confirmed with CT. Three core biopsies were obtained with an 18 gauge core device. Specimens were placed on Telfa pad with saline. Needle was removed without complication and follow up CT images were obtained. Bandage placed over the puncture site. FINDINGS: Again noted is a large anterior mediastinal mass with low-density along the right side of the mass. Biopsy needle was directed into the mass along the left side of the sternum. Needle position confirmed within the mediastinal mass. No significant bleeding or hematoma formation at the end of the procedure. Small loculated right pleural effusion. Tiny focus of gas within the right pleural effusion related to recent thoracentesis. No significant pleural gas. Again noted is a small nodule in the left upper lobe measuring close to 6 mm. IMPRESSION: CT-guided core biopsy of the anterior mediastinal mass. Electronically Signed   By: Markus Daft M.D.   On: 11/04/2020 15:34   DG Chest Port 1 View  Result Date: 11/04/2020 CLINICAL DATA:  Status post thoracentesis EXAM: PORTABLE CHEST 1 VIEW COMPARISON:  October 30, 2020. FINDINGS: No pneumothorax evident. Small right pleural effusion evident. There is mild right base atelectasis. The lungs elsewhere are clear. There is stable cardiomegaly with pulmonary vascularity normal. No adenopathy. No bone lesions. IMPRESSION: No appreciable pneumothorax. Small right pleural effusion with right base atelectasis. No consolidation. Stable cardiomegaly. Electronically Signed   By: Lowella Grip III M.D.   On: 11/04/2020 15:15   CT BONE MARROW BIOPSY & ASPIRATION  Result Date: 11/18/2020 INDICATION: Recent diagnosis of B-cell lymphoma. Please perform CT-guided bone marrow biopsy for tissue diagnostic purposes. EXAM: CT-GUIDED BONE MARROW BIOPSY AND ASPIRATION MEDICATIONS: None ANESTHESIA/SEDATION: Fentanyl 100 mcg IV; Versed 3 mg IV Sedation Time: 12 Minutes; The patient was continuously monitored  during the procedure by the interventional radiology nurse under my direct supervision. COMPLICATIONS: None immediate. PROCEDURE: Informed consent was obtained from the patient following an explanation of the procedure, risks, benefits and alternatives. The patient understands, agrees and consents for the procedure. All questions were addressed. A time out was performed prior to the initiation of the procedure. The patient was positioned left lateral decubitus and non-contrast localization CT was performed of the pelvis to demonstrate the iliac marrow spaces. The operative site was prepped and draped in the usual sterile fashion. Under sterile conditions and local anesthesia, a 22 gauge spinal  needle was utilized for procedural planning. Next, an 11 gauge coaxial bone biopsy needle was advanced into the left iliac marrow space. Needle position was confirmed with CT imaging. Initially, a bone marrow aspiration was performed. Next, a bone marrow biopsy was obtained with the 11 gauge outer bone marrow device. The 11 gauge coaxial bone biopsy needle was re-advanced into a slightly different location within the left iliac marrow space, positioning was confirmed with CT imaging and an additional bone marrow biopsy was obtained. The needle was removed and superficial hemostasis was obtained with manual compression. A dressing was applied. The patient tolerated the procedure well without immediate post procedural complication. IMPRESSION: Successful CT guided left iliac bone marrow aspiration and core biopsy. Electronically Signed   By: Sandi Mariscal M.D.   On: 11/18/2020 11:18   ECHOCARDIOGRAM COMPLETE  Result Date: 11/19/2020    ECHOCARDIOGRAM REPORT   Patient Name:   SOLEY HARRISS Date of Exam: 11/19/2020 Medical Rec #:  [id number]        Height:       66.0 in Accession #:    [id number]       Weight:       214.0 lb Date of Birth:  09-07-1966        BSA:          2.058 m Patient Age:    43 years         BP:            99/64 mmHg Patient Gender: F                HR:           113 bpm. Exam Location:  [facility] Procedure: 2D Echo, Cardiac Doppler, Color Doppler, Strain Analysis and            Intracardiac Opacification Agent Indications:     Chemo Z09  History:         Patient has no prior history of Echocardiogram examinations.                  Cancer pt. malignant pleural eff.  Sonographer:     Sherrie Sport RDCS (AE) Referring Phys:  [place] Diagnosing Phys: Yolonda Kida MD  Sonographer Comments: Suboptimal parasternal window. Global longitudinal strain was attempted. IMPRESSIONS  1. Left ventricular ejection fraction, by estimation, is 55 to 60%. The left ventricle has normal function. The left ventricle has no regional wall motion abnormalities. Left ventricular diastolic parameters were normal.  2. Right ventricular systolic function is normal. The right ventricular size is normal.  3. The mitral valve is normal in structure. No evidence of mitral valve regurgitation.  4. The aortic valve is normal in structure. Aortic valve regurgitation is not visualized. Conclusion(s)/Recommendation(s): Poor windows for evaluation of left ventricular function by transthoracic echocardiography. Would recommend an alternative means of evaluation. FINDINGS  Left Ventricle: Left ventricular ejection fraction, by estimation, is 55 to 60%. The left ventricle has normal function. The left ventricle has no regional wall motion abnormalities. Definity contrast agent was given IV to delineate the left ventricular  endocardial borders. The left ventricular internal cavity size was normal in size. There is no left ventricular hypertrophy. Left ventricular diastolic parameters were normal. Right Ventricle: The right ventricular size is normal. No increase in right ventricular wall thickness. Right ventricular systolic function is normal. Left Atrium: Left atrial size was normal in size. Right Atrium: Right atrial size was normal in size.  Pericardium: Trivial pericardial effusion is present. Mitral Valve: The mitral valve is normal in structure. No evidence of mitral valve regurgitation. Tricuspid Valve: The tricuspid valve is normal in structure. Tricuspid valve regurgitation is trivial. Aortic Valve: The aortic valve is normal in structure. Aortic valve regurgitation is not visualized. Aortic valve mean gradient measures 3.5 mmHg. Aortic valve peak gradient measures 6.4 mmHg. Aortic valve area, by VTI measures 2.91 cm. Pulmonic Valve: The pulmonic valve was normal in structure. Pulmonic valve regurgitation is not visualized. Aorta: The ascending aorta was not well visualized. IAS/Shunts: No atrial level shunt detected by color flow Doppler.  LEFT VENTRICLE PLAX 2D LVIDd:         3.59 cm  Diastology LVIDs:         2.58 cm  LV e' medial:    11.50 cm/s LV PW:         1.25 cm  LV E/e' medial:  8.0 LV IVS:        0.77 cm  LV e' lateral:   10.30 cm/s LVOT diam:     2.20 cm  LV E/e' lateral: 8.9 LV SV:         52 LV SV Index:   25 LVOT Area:     3.80 cm  RIGHT VENTRICLE RV Basal diam:  2.42 cm RV S prime:     15.60 cm/s TAPSE (M-mode): 4.0 cm LEFT ATRIUM           Index       RIGHT ATRIUM          Index LA diam:      2.40 cm 1.17 cm/m  RA Area:     7.95 cm LA Vol (A2C): 16.0 ml 7.77 ml/m  RA Volume:   13.00 ml 6.32 ml/m LA Vol (A4C): 32.7 ml 15.89 ml/m  AORTIC VALVE AV Area (Vmax):    3.08 cm AV Area (Vmean):   2.87 cm AV Area (VTI):     2.91 cm AV Vmax:           126.00 cm/s AV Vmean:          85.100 cm/s AV VTI:            0.180 m AV Peak Grad:      6.4 mmHg AV Mean Grad:      3.5 mmHg LVOT Vmax:         102.00 cm/s LVOT Vmean:        64.300 cm/s LVOT VTI:          0.138 m LVOT/AV VTI ratio: 0.76  AORTA Ao Root diam: 3.35 cm MITRAL VALVE               TRICUSPID VALVE MV Area (PHT): 4.49 cm    TR Peak grad:   16.6 mmHg MV Decel Time: 169 msec    TR Vmax:        204.00 cm/s MV E velocity: 91.70 cm/s MV A velocity: 61.30 cm/s  SHUNTS MV E/A  ratio:  1.50        Systemic VTI:  0.14 m                            Systemic Diam: 2.20 cm Yolonda Kida MD Electronically signed by Yolonda Kida MD Signature Date/Time: 11/19/2020/5:59:27 PM    Final    IR PERC PLEURAL DRAIN W/INDWELL CATH W/IMG GUIDE  Result Date: 11/11/2020 CLINICAL DATA:  55 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 EXAM: INSERTION OF TUNNELED right SIDED PLEURAL DRAINAGE CATHETER COMPARISON:  11/09/2020 MEDICATIONS: Ancef 2 gm IV; Antibiotic was administered in an appropriate time interval for the procedure. ANESTHESIA/SEDATION: Moderate (conscious) sedation was employed during this procedure. A total of Versed 3 mg and Fentanyl 150 mcg was administered intravenously. Moderate Sedation Time: 34 minutes. The patient's level of consciousness and vital signs were monitored continuously by radiology nursing throughout the procedure under my direct supervision. FLUOROSCOPY TIME:  FLUOROSCOPY TIME 0.4 min (8.6 mGy) COMPLICATIONS: None immediate. PROCEDURE: The procedure, risks, benefits, and alternatives were explained to the patient, who wishes to proceed with the placement of this permanent pleural catheter as they are seeking palliative care. The patient understands and consents to the procedure. The right lateral chest and upper abdomen were prepped and draped in a sterile fashion, and a sterile drape was applied covering the operative field. A sterile gown and sterile gloves were used for the procedure. Initial ultrasound scanning and fluoroscopic imaging demonstrates a recurrent moderate to large pleural effusion. Under direct ultrasound guidance, the inferior lateral pleural space was accessed with a Yueh sheath needle after the overlying soft tissues were anesthetized with 1% lidocaine with epinephrine. A Rosen wire was then advanced under fluoroscopy into the pleural space. A 15.5 French tunneled Pleur-X catheter was tunneled from an incision in the right upper  abdominal quadrant to the access site. The pleural access site was serially dilated under fluoroscopy, ultimately allowing placement of a peel-away sheath. The catheter was advanced through the peel-away sheath. The sheath was then removed. Final catheter positioning was confirmed with a fluoroscopic radiographic image. The access incision was closed with Dermabond. A 0 silk retention suture was applied at the catheter exit site. Thoracentesis was performed through the new catheter utilizing provided bulb vacuum assisted drainage bag. Dressings were applied. The patient tolerated the above procedure well without immediate postprocedural complication. FINDINGS: Preprocedural ultrasound scanning demonstrates a recurrent moderate sized right sided pleural effusion. After ultrasound and fluoroscopic guided placement, the catheter is directed superiorly Following catheter placement, approximately 50 cc of translucent, straw-colored pleural fluid was removed. IMPRESSION: Successful placement of permanent, tunneled right pleural drainage catheter via lateral approach. 50 mL of translucent, straw-colored pleural fluid was removed following catheter placement. Ruthann Cancer, MD [facility] Electronically Signed   By: Ruthann Cancer MD   On: 11/11/2020 15:00   US THORACENTESIS ASP PLEURAL SPACE W/IMG GUIDE  Result Date: 11/04/2020 INDICATION: Mediastinal mass. Shortness of breath. Recurrent right pleural effusion. Request for therapeutic thoracentesis. EXAM: ULTRASOUND GUIDED RIGHT THORACENTESIS MEDICATIONS: 1% plain lidocaine, 10 mL COMPLICATIONS: None immediate. PROCEDURE: An ultrasound guided thoracentesis was thoroughly discussed with the patient and questions answered. The benefits, risks, alternatives and complications were also discussed. The patient understands and wishes to proceed with the procedure. Written consent was obtained. Ultrasound was performed  to localize and mark an adequate pocket of fluid in the right chest. The area was then prepped and draped in the normal sterile fashion. 1% Lidocaine was used for local anesthesia. Under ultrasound guidance a 6 Fr Safe-T-Centesis catheter was introduced. Thoracentesis was performed. The catheter was removed and a dressing applied. FINDINGS: A total of approximately 900 mL of clear yellow fluid was removed. Ultrasound of the right chest demonstrates right pleural effusion with interval development of loculations. IMPRESSION: Successful ultrasound guided right thoracentesis yielding 900 mL of pleural fluid. Read by: Ascencion Dike PA-C Electronically Signed  By: Markus Daft M.D.   On: 11/04/2020 12:52     Assessment and plan- Patient is a 55 y.o. female who presents to symptom management for nausea and vomiting.  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 -MRI of brain was negative for mets, PET scan showed malignancy findings as above but negative for abdominal/pelvic metastatic disease or bony disease. -Had CT-guided biopsy on 11/04/20-flow cytometry was low viability which showed likely neoplastic B-cell process. -She required immediate paracentesis and fluid was inconclusive. -Had Pleurx placed secondary to frequent paracentesis. -Completed first cycle of R-CHOP on 11/20/2020. -She had a Port-A-Cath placed on 11/18/2020. -She is scheduled to return to clinic on 11/28/2020 to assess tolerance of first cycle.  Nausea and vomiting: -This is likely secondary to a viral infection and unrelated to chemo and Augmentin. -She was recently started on Augmentin for skin infection.  First dose was on Saturday, 11/23/2020. -Symptoms started about 36 hours ago. -Last episode was this morning at 5 AM.  She is taking Zofran  which has helped. -She is drinking Gatorade and water. -Labs from today do not show any electrolyte abnormalities. -I do not think she needs IV fluids today. -Recommend she take Zofran every 8 hours for the next several days.  Neutropenia: -Unfortunately unable to get Neulasta approved by insurance. -Labs from today show white count of 3.4.  ANC is 3. -She is afebrile. -She is on Augmentin for skin infection.  Mouth sores: -Secondary to R-CHOP. -Prescription sent for Magic mouthwash 4 times daily as needed.  Disposition: -Labs today -No IV fluids -Continue Augmentin. -Take Zofran every 8 hours until symptoms resolved. -Start Magic mouthwash for mouth sores. -Stay hydrated. -[place] lab appointment for tomorrow and change her visit to a virtual visit.   Visit Diagnosis 1. Intractable vomiting with nausea, unspecified vomiting type   2. Mediastinal (thymic) large B-cell lymphoma, unspecified body region [facility])     Patient expressed understanding and was in agreement with this plan. She also understands that She can call clinic at any time with any questions, concerns, or complaints.   Greater than 50% was spent in counseling and coordination of care with this patient including but not limited to discussion of the relevant topics above (See A&P) including, but not limited to diagnosis and management of acute and chronic medical conditions.   Thank you for allowing me to participate in the care of this very pleasant patient.    Jacquelin Hawking, NP [place] [facility] [phone] Pager-looks like when she called 11/29/2020 2:54 PM

## 2020-11-27 NOTE — Progress Notes (Signed)
  Oncology Nurse Navigator Documentation  Navigator Location: [facility]Med Onc (11/27/20 1400)   )Navigator Encounter Type: Follow-up Appt (11/27/20 1400)                     Patient Visit Type: MedOnc (11/27/20 1400)   Barriers/Navigation Needs: Coordination of Care (11/27/20 1400)   Interventions: Coordination of Care;Referrals (11/27/20 1400) Referrals: Symptom Management (11/27/20 1400) Coordination of Care: Appts (11/27/20 1400)      Specialty Items/DME: Pleur X catheter (11/27/20 1400)      met with patient during [facility] visit today. Assisted pt with pleurx catheter drainage. Drained 394ml amber colored fluid. Pt tolerated well. New dressing applied - clean, dry, intact. Pt informed that follow up appt with Dr. Grayland Ormond has been switched to virtual and that labs are being checked today. Instructed pt to call back with any further questions or needs. Pt verbalized understanding.      Time Spent with Patient: 30 (11/27/20 1400)

## 2020-11-27 NOTE — Addendum Note (Signed)
Addended by: Telford Nab on: 11/27/2020 03:01 PM   Modules accepted: Orders

## 2020-11-28 ENCOUNTER — Other Ambulatory Visit: Payer: Self-pay

## 2020-11-28 ENCOUNTER — Inpatient Hospital Stay (HOSPITAL_BASED_OUTPATIENT_CLINIC_OR_DEPARTMENT_OTHER): Payer: 59 | Admitting: Oncology

## 2020-11-28 ENCOUNTER — Inpatient Hospital Stay: Payer: 59

## 2020-11-28 DIAGNOSIS — C8520 Mediastinal (thymic) large B-cell lymphoma, unspecified site: Secondary | ICD-10-CM

## 2020-11-28 NOTE — Progress Notes (Signed)
[place]  Telephone:([phone] [phone]  ID: Jennifer Cole OB: [birth year]  MR#: [id number]  UVO#:[id number]  Patient Care Team: Patient, [name] as PCP - General ([place]) Telford Nab, RN as Oncology Nurse Navigator  I connected with Jennifer Cole on 11/28/20 at 10:15 AM EDT by video enabled telemedicine visit and verified that I am speaking with the correct person using two identifiers.   I discussed the limitations, risks, security and privacy concerns of performing an evaluation and management service by telemedicine and the availability of in-person appointments. I also discussed with the patient that there may be a patient responsible charge related to this service. The patient expressed understanding and agreed to proceed.   Other persons participating in the visit and their role in the encounter: Patient, MD.  Patient's location: Home. Provider's location: Clinic.  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  She was seen in symptom management clinic yesterday for shortness of breath, but this is significantly improved.  She has some mild nausea and weakness and fatigue, but otherwise feels well.  She has no neurologic complaints.  She denies any recent fevers.  She has a fair appetite, but denies weight loss.  She has no chest pain, cough, or hemoptysis.  She denies any vomiting, constipation, or diarrhea.  She has no urinary complaints.  Patient offers no further specific complaints today.  REVIEW OF SYSTEMS:   Review of Systems  Constitutional: Positive for malaise/fatigue. Negative for fever and weight loss.  Respiratory: Positive for shortness of breath. Negative for cough and hemoptysis.   Cardiovascular: Negative.  Negative for chest pain and leg  swelling.  Gastrointestinal: Positive for nausea. Negative for abdominal pain.  Genitourinary: Negative.  Negative for dysuria.  Musculoskeletal: Negative.  Negative for back pain.  Skin: Negative.   Neurological: Positive for weakness. Negative for dizziness, focal weakness and headaches.  Psychiatric/Behavioral: Negative.  The patient is not nervous/anxious.     As per HPI. Otherwise, a complete review of systems is negative.  PAST MEDICAL HISTORY: Past Medical History:  Diagnosis Date  . Cancer ([place])    cervical    PAST SURGICAL HISTORY: Past Surgical History:  Procedure Laterality Date  . IR PERC PLEURAL DRAIN W/INDWELL CATH W/IMG GUIDE  11/11/2020  . PORTA CATH INSERTION N/A 11/18/2020   Procedure: PORTA CATH INSERTION;  Surgeon: Algernon Huxley, MD;  Location: [facility];  Service: Cardiovascular;  Laterality: N/A;    FAMILY HISTORY: No family history on file.  ADVANCED DIRECTIVES (Y/N):  N  HEALTH MAINTENANCE: Social History   Tobacco Use  . Smoking status: Never Smoker  . Smokeless tobacco: Never Used  Substance Use Topics  . Alcohol use: Never     Colonoscopy:  PAP:  Bone density:  Lipid panel:  No Known Allergies  Current Outpatient Medications  Medication Sig Dispense Refill  . acetaminophen (TYLENOL) 500 MG tablet Take 1 tablet (500 mg total) by mouth every 6 (six) hours as needed. 30 tablet 0  . albuterol (VENTOLIN HFA) 108 (90 Base) MCG/ACT inhaler Inhale 2 puffs into the lungs every 6 (six) hours as needed for wheezing or shortness of breath. 8 g 2  . allopurinol (ZYLOPRIM) 300 MG tablet Take 1 tablet (300 mg total) by mouth daily. 30 tablet 3  . amoxicillin-clavulanate (AUGMENTIN) 875-125 MG tablet  Take 1 tablet by mouth 2 (two) times daily. 14 tablet 0  . APIXABAN (ELIQUIS) VTE STARTER PACK (10MG AND 5MG) Take as directed on package: start with two-14m tablets twice daily for 7 days. On day 8, switch to one-555mtablet twice daily. 1 each 0   . folic acid (FOLVITE) 1 MG tablet Take 1 mg by mouth daily.    . Marland Kitchenbuprofen (ADVIL) 600 MG tablet Take 1 tablet (600 mg total) by mouth every 6 (six) hours as needed. 30 tablet 0  . lidocaine-prilocaine (EMLA) cream Apply to affected area once 30 g 3  . magic mouthwash SOLN Take 5 mLs by mouth 4 (four) times daily. 240 mL 2  . ondansetron (ZOFRAN) 8 MG tablet Take 1 tablet (8 mg total) by mouth 2 (two) times daily as needed for refractory nausea / vomiting. 60 tablet 2  . predniSONE (DELTASONE) 20 MG tablet Take 5 tablets (100 mg total) by mouth daily. Take with food on days 1-5 of chemotherapy. 25 tablet 4  . prochlorperazine (COMPAZINE) 10 MG tablet Take 1 tablet (10 mg total) by mouth every 6 (six) hours as needed (Nausea or vomiting). 60 tablet 2   No current facility-administered medications for this visit.    OBJECTIVE: There were no vitals filed for this visit.   There is no height or weight on file to calculate BMI.    ECOG FS:1 - Symptomatic but completely ambulatory  General: Well-developed, well-nourished, no acute distress. HEENT: Normocephalic. Neuro: Alert, answering all questions appropriately. Cranial nerves grossly intact. Psych: Normal affect.  LAB RESULTS:  Lab Results  Component Value Date   NA 136 11/27/2020   K 3.6 11/27/2020   CL 98 11/27/2020   CO2 28 11/27/2020   GLUCOSE 101 (H) 11/27/2020   BUN 18 11/27/2020   CREATININE 0.51 11/27/2020   CALCIUM 8.7 (L) 11/27/2020   PROT 7.0 11/27/2020   ALBUMIN 3.7 11/27/2020   AST 23 11/27/2020   ALT 15 11/27/2020   ALKPHOS 72 11/27/2020   BILITOT 0.7 11/27/2020   GFRNONAA >60 11/27/2020    Lab Results  Component Value Date   WBC 3.4 (L) 11/27/2020   NEUTROABS 3.0 11/27/2020   HGB 13.1 11/27/2020   HCT 39.7 11/27/2020   MCV 85.6 11/27/2020   PLT 250 11/27/2020     STUDIES: DG Chest 2 View  Result Date: 11/09/2020 CLINICAL DATA:  Shortness of breath.  History of lung cancer EXAM: CHEST - 2 VIEW  COMPARISON:  October 23, 2020, November 04, 2020 FINDINGS: The cardiomediastinal silhouette is unchanged in contour with unchanged irregular RIGHT perihilar contours and opacity of the anterior mediastinum.There is a moderate RIGHT pleural effusion, increased in comparison to prior. Trace LEFT pleural effusion. No pneumothorax. Homogeneous opacification of the RIGHT lung base, likely atelectasis. Status post cholecystectomy. Mild degenerative changes of the thoracic spine. IMPRESSION: 1. Interval increase in size of a moderate RIGHT pleural effusion. 2. Revisualization of malignancy involving the anterior mediastinum and extending along the RIGHT perihilar border. Electronically Signed   By: StValentino SaxonD   On: 11/09/2020 11:50   MR Brain W Wo Contrast  Result Date: 10/30/2020 CLINICAL DATA:  Staging of recently diagnosed lung cancer. EXAM: MRI HEAD WITHOUT AND WITH CONTRAST TECHNIQUE: Multiplanar, multiecho pulse sequences of the brain and surrounding structures were obtained without and with intravenous contrast. CONTRAST:  59m61mADAVIST GADOBUTROL 1 MMOL/ML IV SOLN COMPARISON:  None. FINDINGS: Brain: There is no evidence of an acute infarct, intracranial hemorrhage, mass,  midline shift, or extra-axial fluid collection. The ventricles and sulci are normal. A few punctate foci of T2 hyperintensity in the cerebral white matter are nonspecific and considered to be within normal limits for age. No abnormal enhancement is identified. Vascular: Major intracranial vascular flow voids are preserved. Skull and upper cervical spine: Unremarkable bone marrow signal. Sinuses/Orbits: Unremarkable orbits. Clear paranasal sinuses. Small bilateral mastoid effusions. Other: None. IMPRESSION: Unremarkable appearance of the brain for age. No evidence of intracranial metastases. Electronically Signed   By: Logan Bores M.D.   On: 10/30/2020 08:17   PERIPHERAL VASCULAR CATHETERIZATION  Result Date: 11/18/2020 See op  note  NM PET Image Initial (PI) Skull Base To Thigh  Result Date: 10/30/2020 CLINICAL DATA:  Initial treatment strategy for mediastinal mass. EXAM: NUCLEAR MEDICINE PET SKULL BASE TO THIGH TECHNIQUE: 11.4 mCi F-18 FDG was injected intravenously. Full-ring PET imaging was performed from the skull base to thigh after the radiotracer. CT data was obtained and used for attenuation correction and anatomic localization. Fasting blood glucose: 72 mg/dl COMPARISON:  CT scan 10/30/2020 FINDINGS: Mediastinal blood pool activity: SUV max 1.97 Liver activity: SUV max NA NECK: No neck mass or adenopathy. Incidental CT findings: none CHEST: Hypermetabolic soft tissue mass noted in the left upper chest wall area with ill-defined soft tissue density which could be a nodal mass measures a maximum of 14 mm and the SUV max is 19.07. Small subclavicular lymph node measures 8 mm on image 63/3 and has an SUV max of 3.37. No breast masses, axillary or subpectoral adenopathy. Large anterior mediastinal mass also invading the middle mediastinum on the right side. The lesion is markedly hypermetabolic with SUV max of 37.04. There is an area of chest wall invasion along the left side of the sternum. There is also a separate pleural nodule on the left side measuring 10 mm with an SUV max of 4.20. A small pleural nodule is also noted posterior to the descending thoracic aorta on image number 74/3. It measures 10 mm and the SUV max is 6.21. Moderate-sized right pleural effusion is noted but no definite hypermetabolic right-sided pleural nodules. A few small indeterminate pulmonary nodules are also noted. Incidental CT findings: none ABDOMEN/PELVIS: No hypermetabolic solid abdominal organ lesions and no hypermetabolic lymphadenopathy. No inguinal adenopathy. Incidental CT findings: none SKELETON: No osseous lesions are identified. Incidental CT findings: none IMPRESSION: 1. Large markedly hypermetabolic anterior mediastinal mass also  involving the middle mediastinum on the right side. Findings suspicious for thymic neoplasm. Lymphoma and germ cell tumors would be other possibilities. The lesion appears to invade the left chest wall along the lateral margin of the sternum. 2. Associated small hypermetabolic pleural nodules on the left side. 3. 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 4. A few small indeterminate pulmonary nodules are noted. 5. Hypermetabolic soft tissue mass in the left chest wall anterior to the left first rib could represent adenopathy. 6. No findings for abdominal/pelvic metastatic disease or osseous metastatic disease. Electronically Signed   By: Marijo Sanes M.D.   On: 10/30/2020 17:06   US Venous Img Upper Uni Right  Result Date: 11/20/2020 CLINICAL DATA:  Right upper extremity edema.  Evaluate for DVT. EXAM: RIGHT UPPER EXTREMITY VENOUS DOPPLER ULTRASOUND TECHNIQUE: Gray-scale sonography with graded compression, as well as color Doppler and duplex ultrasound were performed to evaluate the upper extremity deep venous system from the level of the subclavian vein and including the jugular, axillary, basilic, radial, ulnar and upper cephalic vein. Spectral Doppler was utilized  to evaluate flow at rest and with distal augmentation maneuvers. COMPARISON:  None. FINDINGS: Contralateral Subclavian Vein: Respiratory phasicity is normal and symmetric with the symptomatic side. No evidence of thrombus. Normal compressibility. Internal Jugular Vein: No evidence of thrombus. Normal compressibility, respiratory phasicity and response to augmentation. Subclavian Vein: No evidence of thrombus. Normal compressibility, respiratory phasicity and response to augmentation. Axillary Vein: No evidence of thrombus. Normal compressibility, respiratory phasicity and response to augmentation. Cephalic Vein: No evidence of thrombus. Normal compressibility, respiratory phasicity and response to augmentation. Basilic Vein: No evidence of thrombus.  Normal compressibility, respiratory phasicity and response to augmentation. Brachial Veins: There is hypoechoic occlusive thrombus involving one of the paired brachial veins (images 21 and 26). The adjacent paired brachial vein appears widely patent. Radial Veins: No evidence of thrombus. Normal compressibility, respiratory phasicity and response to augmentation. Ulnar Veins: No evidence of thrombus. Normal compressibility, respiratory phasicity and response to augmentation. Other Findings:  None visualized. IMPRESSION: Examination is positive for occlusive DVT involving one of the paired right brachial veins. There is no extension of this short-segment occlusive DVT to the more proximal venous system of the right upper extremity Electronically Signed   By: Sandi Mariscal M.D.   On: 11/20/2020 11:13   CT BIOPSY  Result Date: 11/04/2020 INDICATION: 55 year old with a large mediastinal mass. Tissue diagnosis is needed. EXAM: CT-GUIDED CORE BIOPSY OF MEDIASTINAL MASS MEDICATIONS: None. ANESTHESIA/SEDATION: Moderate (conscious) sedation was employed during this procedure. A total of Versed 2.0 mg and Fentanyl 100 mcg was administered intravenously. Moderate Sedation Time: 16 minutes. The patient's level of consciousness and vital signs were monitored continuously by radiology nursing throughout the procedure under my direct supervision. FLUOROSCOPY TIME:  Fluoroscopy Time: None COMPLICATIONS: None immediate. PROCEDURE: Informed written consent was obtained from the patient after a thorough discussion of the procedural risks, benefits and alternatives. All questions were addressed. A timeout was performed prior to the initiation of the procedure. Patient was placed supine on the CT scanner. CT images through the chest were obtained. The anterior chest was prepped with chlorhexidine and sterile field was created. Skin and soft tissues were anesthetized with 1% lidocaine. Using CT guidance, a 17 gauge coaxial needle was  directed into the anterior mediastinal mass along the left side of the sternum. Needle was directed into the mediastinal mass and needle position was confirmed with CT. Three core biopsies were obtained with an 18 gauge core device. Specimens were placed on Telfa pad with saline. Needle was removed without complication and follow up CT images were obtained. Bandage placed over the puncture site. FINDINGS: Again noted is a large anterior mediastinal mass with low-density along the right side of the mass. Biopsy needle was directed into the mass along the left side of the sternum. Needle position confirmed within the mediastinal mass. No significant bleeding or hematoma formation at the end of the procedure. Small loculated right pleural effusion. Tiny focus of gas within the right pleural effusion related to recent thoracentesis. No significant pleural gas. Again noted is a small nodule in the left upper lobe measuring close to 6 mm. IMPRESSION: CT-guided core biopsy of the anterior mediastinal mass. Electronically Signed   By: Markus Daft M.D.   On: 11/04/2020 15:34   DG Chest Port 1 View  Result Date: 11/04/2020 CLINICAL DATA:  Status post thoracentesis EXAM: PORTABLE CHEST 1 VIEW COMPARISON:  October 30, 2020. FINDINGS: No pneumothorax evident. Small right pleural effusion evident. There is mild right base atelectasis. The lungs elsewhere  are clear. There is stable cardiomegaly with pulmonary vascularity normal. No adenopathy. No bone lesions. IMPRESSION: No appreciable pneumothorax. Small right pleural effusion with right base atelectasis. No consolidation. Stable cardiomegaly. Electronically Signed   By: Lowella Grip III M.D.   On: 11/04/2020 15:15   DG Chest Port 1 View  Result Date: 10/30/2020 CLINICAL DATA:  Status post thoracentesis. EXAM: PORTABLE CHEST 1 VIEW COMPARISON:  10/28/2020 FINDINGS: Interval near complete evacuation of the right pleural fluid collection. No postprocedural  pneumothorax is identified. Streaky right basilar atelectasis. IMPRESSION: Near complete evacuation of right pleural fluid collection without postprocedural pneumothorax. Electronically Signed   By: Marijo Sanes M.D.   On: 10/30/2020 13:04   CT BONE MARROW BIOPSY & ASPIRATION  Result Date: 11/18/2020 INDICATION: Recent diagnosis of B-cell lymphoma. Please perform CT-guided bone marrow biopsy for tissue diagnostic purposes. EXAM: CT-GUIDED BONE MARROW BIOPSY AND ASPIRATION MEDICATIONS: None ANESTHESIA/SEDATION: Fentanyl 100 mcg IV; Versed 3 mg IV Sedation Time: 12 Minutes; The patient was continuously monitored during the procedure by the interventional radiology nurse under my direct supervision. COMPLICATIONS: None immediate. PROCEDURE: Informed consent was obtained from the patient following an explanation of the procedure, risks, benefits and alternatives. The patient understands, agrees and consents for the procedure. All questions were addressed. A time out was performed prior to the initiation of the procedure. The patient was positioned left lateral decubitus and non-contrast localization CT was performed of the pelvis to demonstrate the iliac marrow spaces. The operative site was prepped and draped in the usual sterile fashion. Under sterile conditions and local anesthesia, a 22 gauge spinal needle was utilized for procedural planning. Next, an 11 gauge coaxial bone biopsy needle was advanced into the left iliac marrow space. Needle position was confirmed with CT imaging. Initially, a bone marrow aspiration was performed. Next, a bone marrow biopsy was obtained with the 11 gauge outer bone marrow device. The 11 gauge coaxial bone biopsy needle was re-advanced into a slightly different location within the left iliac marrow space, positioning was confirmed with CT imaging and an additional bone marrow biopsy was obtained. The needle was removed and superficial hemostasis was obtained with manual  compression. A dressing was applied. The patient tolerated the procedure well without immediate post procedural complication. IMPRESSION: Successful CT guided left iliac bone marrow aspiration and core biopsy. Electronically Signed   By: Sandi Mariscal M.D.   On: 11/18/2020 11:18   ECHOCARDIOGRAM COMPLETE  Result Date: 11/19/2020    ECHOCARDIOGRAM REPORT   Patient Name:   Jennifer Cole Date of Exam: 11/19/2020 Medical Rec #:  [id number]        Height:       66.0 in Accession #:    [id number]       Weight:       214.0 lb Date of Birth:  Jan 31, 1966        BSA:          2.058 m Patient Age:    60 years         BP:           99/64 mmHg Patient Gender: F                HR:           113 bpm. Exam Location:  [facility] Procedure: 2D Echo, Cardiac Doppler, Color Doppler, Strain Analysis and            Intracardiac Opacification Agent Indications:     Chemo Z09  History:         Patient has no prior history of Echocardiogram examinations.                  Cancer pt. malignant pleural eff.  Sonographer:     Sherrie Sport RDCS (AE) Referring Phys:  [place] Diagnosing Phys: Yolonda Kida MD  Sonographer Comments: Suboptimal parasternal window. Global longitudinal strain was attempted. IMPRESSIONS  1. Left ventricular ejection fraction, by estimation, is 55 to 60%. The left ventricle has normal function. The left ventricle has no regional wall motion abnormalities. Left ventricular diastolic parameters were normal.  2. Right ventricular systolic function is normal. The right ventricular size is normal.  3. The mitral valve is normal in structure. No evidence of mitral valve regurgitation.  4. The aortic valve is normal in structure. Aortic valve regurgitation is not visualized. Conclusion(s)/Recommendation(s): Poor windows for evaluation of left ventricular function by transthoracic echocardiography. Would recommend an alternative means of evaluation. FINDINGS  Left Ventricle: Left ventricular ejection fraction, by  estimation, is 55 to 60%. The left ventricle has normal function. The left ventricle has no regional wall motion abnormalities. Definity contrast agent was given IV to delineate the left ventricular  endocardial borders. The left ventricular internal cavity size was normal in size. There is no left ventricular hypertrophy. Left ventricular diastolic parameters were normal. Right Ventricle: The right ventricular size is normal. No increase in right ventricular wall thickness. Right ventricular systolic function is normal. Left Atrium: Left atrial size was normal in size. Right Atrium: Right atrial size was normal in size. Pericardium: Trivial pericardial effusion is present. Mitral Valve: The mitral valve is normal in structure. No evidence of mitral valve regurgitation. Tricuspid Valve: The tricuspid valve is normal in structure. Tricuspid valve regurgitation is trivial. Aortic Valve: The aortic valve is normal in structure. Aortic valve regurgitation is not visualized. Aortic valve mean gradient measures 3.5 mmHg. Aortic valve peak gradient measures 6.4 mmHg. Aortic valve area, by VTI measures 2.91 cm. Pulmonic Valve: The pulmonic valve was normal in structure. Pulmonic valve regurgitation is not visualized. Aorta: The ascending aorta was not well visualized. IAS/Shunts: No atrial level shunt detected by color flow Doppler.  LEFT VENTRICLE PLAX 2D LVIDd:         3.59 cm  Diastology LVIDs:         2.58 cm  LV e' medial:    11.50 cm/s LV PW:         1.25 cm  LV E/e' medial:  8.0 LV IVS:        0.77 cm  LV e' lateral:   10.30 cm/s LVOT diam:     2.20 cm  LV E/e' lateral: 8.9 LV SV:         52 LV SV Index:   25 LVOT Area:     3.80 cm  RIGHT VENTRICLE RV Basal diam:  2.42 cm RV S prime:     15.60 cm/s TAPSE (M-mode): 4.0 cm LEFT ATRIUM           Index       RIGHT ATRIUM          Index LA diam:      2.40 cm 1.17 cm/m  RA Area:     7.95 cm LA Vol (A2C): 16.0 ml 7.77 ml/m  RA Volume:   13.00 ml 6.32 ml/m LA Vol  (A4C): 32.7 ml 15.89 ml/m  AORTIC VALVE AV Area (Vmax):    3.08  cm AV Area (Vmean):   2.87 cm AV Area (VTI):     2.91 cm AV Vmax:           126.00 cm/s AV Vmean:          85.100 cm/s AV VTI:            0.180 m AV Peak Grad:      6.4 mmHg AV Mean Grad:      3.5 mmHg LVOT Vmax:         102.00 cm/s LVOT Vmean:        64.300 cm/s LVOT VTI:          0.138 m LVOT/AV VTI ratio: 0.76  AORTA Ao Root diam: 3.35 cm MITRAL VALVE               TRICUSPID VALVE MV Area (PHT): 4.49 cm    TR Peak grad:   16.6 mmHg MV Decel Time: 169 msec    TR Vmax:        204.00 cm/s MV E velocity: 91.70 cm/s MV A velocity: 61.30 cm/s  SHUNTS MV E/A ratio:  1.50        Systemic VTI:  0.14 m                            Systemic Diam: 2.20 cm Dwayne Prince Rome MD Electronically signed by Yolonda Kida MD Signature Date/Time: 11/19/2020/5:59:27 PM    Final    IR PERC PLEURAL DRAIN W/INDWELL CATH W/IMG GUIDE  Result Date: 11/11/2020 CLINICAL DATA:  55 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 EXAM: INSERTION OF TUNNELED right SIDED PLEURAL DRAINAGE CATHETER COMPARISON:  11/09/2020 MEDICATIONS: Ancef 2 gm IV; Antibiotic was administered in an appropriate time interval for the procedure. ANESTHESIA/SEDATION: Moderate (conscious) sedation was employed during this procedure. A total of Versed 3 mg and Fentanyl 150 mcg was administered intravenously. Moderate Sedation Time: 34 minutes. The patient's level of consciousness and vital signs were monitored continuously by radiology nursing throughout the procedure under my direct supervision. FLUOROSCOPY TIME:  FLUOROSCOPY TIME 0.4 min (8.6 mGy) COMPLICATIONS: None immediate. PROCEDURE: The procedure, risks, benefits, and alternatives were explained to the patient, who wishes to proceed with the placement of this permanent pleural catheter as they are seeking palliative care. The patient understands and consents to the procedure. The right lateral chest and upper abdomen were prepped and  draped in a sterile fashion, and a sterile drape was applied covering the operative field. A sterile gown and sterile gloves were used for the procedure. Initial ultrasound scanning and fluoroscopic imaging demonstrates a recurrent moderate to large pleural effusion. Under direct ultrasound guidance, the inferior lateral pleural space was accessed with a Yueh sheath needle after the overlying soft tissues were anesthetized with 1% lidocaine with epinephrine. A Rosen wire was then advanced under fluoroscopy into the pleural space. A 15.5 French tunneled Pleur-X catheter was tunneled from an incision in the right upper abdominal quadrant to the access site. The pleural access site was serially dilated under fluoroscopy, ultimately allowing placement of a peel-away sheath. The catheter was advanced through the peel-away sheath. The sheath was then removed. Final catheter positioning was confirmed with a fluoroscopic radiographic image. The access incision was closed with Dermabond. A 0 silk retention suture was applied at the catheter exit site. Thoracentesis was performed through the new catheter utilizing provided bulb vacuum assisted drainage bag. Dressings were applied. The patient tolerated the above  procedure well without immediate postprocedural complication. FINDINGS: Preprocedural ultrasound scanning demonstrates a recurrent moderate sized right sided pleural effusion. After ultrasound and fluoroscopic guided placement, the catheter is directed superiorly Following catheter placement, approximately 50 cc of translucent, straw-colored pleural fluid was removed. IMPRESSION: Successful placement of permanent, tunneled right pleural drainage catheter via lateral approach. 50 mL of translucent, straw-colored pleural fluid was removed following catheter placement. Ruthann Cancer, MD [facility] Radiology Electronically Signed   By: Ruthann Cancer MD   On: 11/11/2020  15:00   US THORACENTESIS ASP PLEURAL SPACE W/IMG GUIDE  Result Date: 11/04/2020 INDICATION: Mediastinal mass. Shortness of breath. Recurrent right pleural effusion. Request for therapeutic thoracentesis. EXAM: ULTRASOUND GUIDED RIGHT THORACENTESIS MEDICATIONS: 1% plain lidocaine, 10 mL COMPLICATIONS: None immediate. PROCEDURE: An ultrasound guided thoracentesis was thoroughly discussed with the patient and questions answered. The benefits, risks, alternatives and complications were also discussed. The patient understands and wishes to proceed with the procedure. Written consent was obtained. Ultrasound was performed to localize and mark an adequate pocket of fluid in the right chest. The area was then prepped and draped in the normal sterile fashion. 1% Lidocaine was used for local anesthesia. Under ultrasound guidance a 6 Fr Safe-T-Centesis catheter was introduced. Thoracentesis was performed. The catheter was removed and a dressing applied. FINDINGS: A total of approximately 900 mL of clear yellow fluid was removed. Ultrasound of the right chest demonstrates right pleural effusion with interval development of loculations. IMPRESSION: Successful ultrasound guided right thoracentesis yielding 900 mL of pleural fluid. Read by: Ascencion Dike PA-C Electronically Signed   By: Markus Daft M.D.   On: 11/04/2020 12:52   US THORACENTESIS ASP PLEURAL SPACE W/IMG GUIDE  Result Date: 10/30/2020 INDICATION: 55 year old with mediastinal mass and recurrent right pleural effusion. EXAM: ULTRASOUND GUIDED RIGHT THORACENTESIS MEDICATIONS: None. COMPLICATIONS: None immediate. PROCEDURE: An ultrasound guided thoracentesis was thoroughly discussed with the patient and questions answered. The benefits, risks, alternatives and complications were also discussed. The patient understands and wishes to proceed with the procedure. Written consent was obtained. Ultrasound was performed to localize and mark an adequate pocket of fluid in  the right chest. The area was then prepped and draped in the normal sterile fashion. 1% Lidocaine was used for local anesthesia. Under ultrasound guidance a 6 Fr Safe-T-Centesis catheter was introduced. Thoracentesis was performed. The catheter was removed and a dressing applied. FINDINGS: A total of approximately 1.3 L of yellow fluid was removed. IMPRESSION: Successful ultrasound guided right thoracentesis yielding 1.3 L of pleural fluid. Electronically Signed   By: Markus Daft M.D.   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   PLAN:    1.  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  Bone marrow biopsy is negative for lymphoma.  Cardiac echo completed on November 19, 2020 revealed an EF of 55 to 60%.  Repeat in June 2022.  Patient received cycle 1 of R-CHOP 1 week ago and tolerated her treatment well without significant side effects.  She was unable to receive Ziextenzo injection secondary to insurance reasons, but she will be able to receive Udenyca with cycle 2.  Return to clinic in 2 weeks for further evaluation and consideration of cycle 2.  2.  Shortness of breath/cough: Improving.  Patient only received 300 mL out from her Pleurx catheter this week.  Secondary  to pleural effusion from lymphoma.  3.  Pain: Patient does complain of this today.  Continue current narcotics as prescribed. 4.  Headache: Patient does not complain of this today.  MRI of the brain on October 30, 2020 did not report any significant pathology. 5.  Right arm DVT: Continue Eliquis as prescribed at least through the duration of her treatments.  Okay to use port.  6.  Leukopenia: Mild, monitor.  Udenyca as above.  Patient expressed understanding and was in agreement with this plan. She also understands that She can call clinic at any time with any  questions, concerns, or complaints.   Cancer Staging Mediastinal (thymic) large B-cell lymphoma ([place]) Staging form: Hodgkin and Non-Hodgkin Lymphoma, AJCC 8th Edition - Clinical stage from 11/13/2020: Stage II bulky (Diffuse large B-cell lymphoma) - Signed by Lloyd Huger, MD on 11/13/2020   Lloyd Huger, MD   11/28/2020 11:40 AM

## 2020-11-29 ENCOUNTER — Other Ambulatory Visit: Payer: Self-pay | Admitting: *Deleted

## 2020-11-29 MED ORDER — DIPHENOXYLATE-ATROPINE 2.5-0.025 MG PO TABS
1.0000 | ORAL_TABLET | Freq: Four times a day (QID) | ORAL | 1 refills | Status: DC | PRN
Start: 2020-11-29 — End: 2021-01-08

## 2020-11-29 NOTE — Telephone Encounter (Signed)
Pt experiencing diarrhea which started yesterday that is not controlled with imodium. Per Sonia Baller- will send in lomotil. Pt made aware and encouraged fluid intake. Instructed to call back if not better. Pt verbalized understanding.

## 2020-12-04 ENCOUNTER — Encounter: Payer: Self-pay | Admitting: *Deleted

## 2020-12-04 NOTE — Progress Notes (Signed)
  Oncology Nurse Navigator Documentation  Navigator Location: [facility] (12/04/20 0800)   )Navigator Encounter Type: Other: (12/04/20 0800)                         Barriers/Navigation Needs: No Needs;No Questions;No Barriers At This Time (12/04/20 0800)   Interventions: Other (12/04/20 0800)          Specialty Items/DME: Pleur X catheter (12/04/20 0800)      received call from pt that has had increased cough and is thinking she needs to have her pleurx catheter drained today. Assisted pt with drainage. Pleurx cath to the right chest wall was clean dry and intact. Slight redness to the area. Unable to drain any fluid from pleurx cath today. New dressing applied - clean, dry, and intact. Pt informed that there is no fluid present to drain which means she is responding to treatment. Informed that her increased cough most likely is related to her pleurx catheter being in place without fluid and causing slight irritation to her pleura. Dr. Grayland Ormond made aware and instructed pt to call back if notices any symptoms of increased shortness of breath before next visit on 3/30. Pt verbalized understanding.      Time Spent with Patient: 30 (12/04/20 0800)

## 2020-12-05 NOTE — Progress Notes (Signed)
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  Her shortness of breath has essentially resolved, but she continues to have pain at the site of her Pleurx catheter.  She otherwise feels well.  She has no neurologic complaints.  She denies any recent fevers.  She has a fair appetite, but denies weight loss.  She has no chest pain, cough, or hemoptysis.  She denies any nausea, vomiting, constipation, or diarrhea.  She has no urinary complaints.  Patient offers no further specific complaints today.  REVIEW OF SYSTEMS:   Review of Systems  Constitutional: Negative.  Negative for fever, malaise/fatigue and weight loss.  Respiratory: Negative for cough, hemoptysis and shortness of breath.   Cardiovascular: Negative.  Negative for chest pain and leg swelling.  Gastrointestinal: Negative.  Negative for abdominal pain and nausea.  Genitourinary: Negative.  Negative for dysuria.  Musculoskeletal: Negative.  Negative for back pain.  Skin: Negative.   Neurological: Negative.  Negative for dizziness, focal weakness, weakness and headaches.  Psychiatric/Behavioral: Negative.  The patient is not nervous/anxious.     As per HPI. Otherwise, a complete review of systems is negative.  PAST MEDICAL HISTORY: Past Medical History:  Diagnosis Date  . Cancer ([place])    cervical    PAST SURGICAL HISTORY: Past Surgical History:  Procedure Laterality Date  . IR PERC PLEURAL DRAIN W/INDWELL CATH W/IMG GUIDE  11/11/2020  . PORTA CATH INSERTION N/A 11/18/2020   Procedure: PORTA CATH  INSERTION;  Surgeon: Algernon Huxley, MD;  Location: [place] [facility];  Service: Cardiovascular;  Laterality: N/A;    FAMILY HISTORY: History reviewed. No pertinent family history.  ADVANCED DIRECTIVES (Y/N):  N  HEALTH MAINTENANCE: Social History   Tobacco Use  . Smoking status: Never Smoker  . Smokeless tobacco: Never Used  Substance Use Topics  . Alcohol use: Never     Colonoscopy:  PAP:  Bone density:  Lipid panel:  No Known Allergies  Current Outpatient Medications  Medication Sig Dispense Refill  . acetaminophen (TYLENOL) 500 MG tablet Take 1 tablet (500 mg total) by mouth every 6 (six) hours as needed. 30 tablet 0  . albuterol (VENTOLIN HFA) 108 (90 Base) MCG/ACT inhaler Inhale 2 puffs into the lungs every 6 (six) hours as needed for wheezing or shortness of breath. 8 g 2  . allopurinol (ZYLOPRIM) 300 MG tablet Take 1 tablet (300 mg total) by mouth daily. 30 tablet 3  . APIXABAN (ELIQUIS) VTE STARTER PACK (10MG AND 5MG) Take as directed on package: start with two-3m tablets twice daily for 7 days. On day 8, switch to one-533mtablet twice daily. 1 each 0  . diphenoxylate-atropine (LOMOTIL) 2.5-0.025 MG tablet Take 1 tablet by mouth 4 (four) times daily as needed for diarrhea or loose stools. 30 tablet 1  . folic acid (FOLVITE) 1 MG tablet Take 1 mg by mouth daily.    . Marland Kitchenbuprofen (ADVIL) 600 MG tablet Take 1 tablet (600 mg total) by mouth every 6 (six) hours as needed. 30 tablet 0  . lidocaine-prilocaine (EMLA) cream Apply to affected area once 30 g 3  . ondansetron (ZOFRAN)  8 MG tablet Take 1 tablet (8 mg total) by mouth 2 (two) times daily as needed for refractory nausea / vomiting. 60 tablet 2  . predniSONE (DELTASONE) 20 MG tablet Take 5 tablets (100 mg total) by mouth daily. Take with food on days 1-5 of chemotherapy. 25 tablet 4  . prochlorperazine (COMPAZINE) 10 MG tablet Take 1 tablet (10 mg total) by mouth every 6 (six) hours as needed (Nausea or vomiting). 60  tablet 2  . magic mouthwash SOLN Take 5 mLs by mouth 4 (four) times daily. (Patient not taking: Reported on 12/11/2020) 240 mL 2   No current facility-administered medications for this visit.   Facility-Administered Medications Ordered in Other Visits  Medication Dose Route Frequency Provider Last Rate Last Admin  . heparin lock flush 100 unit/mL  500 Units Intravenous Once Lloyd Huger, MD        OBJECTIVE: Vitals:   12/11/20 0957  BP: 118/81  Pulse: 100  Resp: 16  Temp: 97.6 F (36.4 C)     Body mass index is 32.83 kg/m.    ECOG FS:0 - Asymptomatic  General: Well-developed, well-nourished, no acute distress. Eyes: Pink conjunctiva, anicteric sclera. HEENT: Normocephalic, moist mucous membranes. Lungs: No audible wheezing or coughing. Heart: Regular rate and rhythm. Abdomen: Soft, nontender, no obvious distention. Musculoskeletal: No edema, cyanosis, or clubbing.  Pleurx catheter in place. Neuro: Alert, answering all questions appropriately. Cranial nerves grossly intact. Skin: No rashes or petechiae noted. Psych: Normal affect.  LAB RESULTS:  Lab Results  Component Value Date   NA 138 12/11/2020   K 4.1 12/11/2020   CL 102 12/11/2020   CO2 29 12/11/2020   GLUCOSE 89 12/11/2020   BUN 15 12/11/2020   CREATININE 0.55 12/11/2020   CALCIUM 9.3 12/11/2020   PROT 7.1 12/11/2020   ALBUMIN 4.0 12/11/2020   AST 31 12/11/2020   ALT 27 12/11/2020   ALKPHOS 83 12/11/2020   BILITOT 0.7 12/11/2020   GFRNONAA >60 12/11/2020    Lab Results  Component Value Date   WBC 4.0 12/11/2020   NEUTROABS 2.6 12/11/2020   HGB 12.8 12/11/2020   HCT 39.3 12/11/2020   MCV 88.1 12/11/2020   PLT 354 12/11/2020     STUDIES: PERIPHERAL VASCULAR CATHETERIZATION  Result Date: 11/18/2020 See op note  US Venous Img Upper Uni Right  Result Date: 11/20/2020 CLINICAL DATA:  Right upper extremity edema.  Evaluate for DVT. EXAM: RIGHT UPPER EXTREMITY VENOUS DOPPLER ULTRASOUND  TECHNIQUE: Gray-scale sonography with graded compression, as well as color Doppler and duplex ultrasound were performed to evaluate the upper extremity deep venous system from the level of the subclavian vein and including the jugular, axillary, basilic, radial, ulnar and upper cephalic vein. Spectral Doppler was utilized to evaluate flow at rest and with distal augmentation maneuvers. COMPARISON:  None. FINDINGS: Contralateral Subclavian Vein: Respiratory phasicity is normal and symmetric with the symptomatic side. No evidence of thrombus. Normal compressibility. Internal Jugular Vein: No evidence of thrombus. Normal compressibility, respiratory phasicity and response to augmentation. Subclavian Vein: No evidence of thrombus. Normal compressibility, respiratory phasicity and response to augmentation. Axillary Vein: No evidence of thrombus. Normal compressibility, respiratory phasicity and response to augmentation. Cephalic Vein: No evidence of thrombus. Normal compressibility, respiratory phasicity and response to augmentation. Basilic Vein: No evidence of thrombus. Normal compressibility, respiratory phasicity and response to augmentation. Brachial Veins: There is hypoechoic occlusive thrombus involving one of the paired brachial veins (images 21 and 26). The adjacent paired brachial vein appears widely  patent. Radial Veins: No evidence of thrombus. Normal compressibility, respiratory phasicity and response to augmentation. Ulnar Veins: No evidence of thrombus. Normal compressibility, respiratory phasicity and response to augmentation. Other Findings:  None visualized. IMPRESSION: Examination is positive for occlusive DVT involving one of the paired right brachial veins. There is no extension of this short-segment occlusive DVT to the more proximal venous system of the right upper extremity Electronically Signed   By: Sandi Mariscal M.D.   On: 11/20/2020 11:13   CT BONE MARROW BIOPSY & ASPIRATION  Result Date:  11/18/2020 INDICATION: Recent diagnosis of B-cell lymphoma. Please perform CT-guided bone marrow biopsy for tissue diagnostic purposes. EXAM: CT-GUIDED BONE MARROW BIOPSY AND ASPIRATION MEDICATIONS: None ANESTHESIA/SEDATION: Fentanyl 100 mcg IV; Versed 3 mg IV Sedation Time: 12 Minutes; The patient was continuously monitored during the procedure by the interventional radiology nurse under my direct supervision. COMPLICATIONS: None immediate. PROCEDURE: Informed consent was obtained from the patient following an explanation of the procedure, risks, benefits and alternatives. The patient understands, agrees and consents for the procedure. All questions were addressed. A time out was performed prior to the initiation of the procedure. The patient was positioned left lateral decubitus and non-contrast localization CT was performed of the pelvis to demonstrate the iliac marrow spaces. The operative site was prepped and draped in the usual sterile fashion. Under sterile conditions and local anesthesia, a 22 gauge spinal needle was utilized for procedural planning. Next, an 11 gauge coaxial bone biopsy needle was advanced into the left iliac marrow space. Needle position was confirmed with CT imaging. Initially, a bone marrow aspiration was performed. Next, a bone marrow biopsy was obtained with the 11 gauge outer bone marrow device. The 11 gauge coaxial bone biopsy needle was re-advanced into a slightly different location within the left iliac marrow space, positioning was confirmed with CT imaging and an additional bone marrow biopsy was obtained. The needle was removed and superficial hemostasis was obtained with manual compression. A dressing was applied. The patient tolerated the procedure well without immediate post procedural complication. IMPRESSION: Successful CT guided left iliac bone marrow aspiration and core biopsy. Electronically Signed   By: Sandi Mariscal M.D.   On: 11/18/2020 11:18   ECHOCARDIOGRAM  COMPLETE  Result Date: 11/19/2020    ECHOCARDIOGRAM REPORT   Patient Name:   Jennifer Cole Date of Exam: 11/19/2020 Medical Rec #:  [id number]        Height:       66.0 in Accession #:    [id number]       Weight:       214.0 lb Date of Birth:  12/06/1965        BSA:          2.058 m Patient Age:    55 years         BP:           99/64 mmHg Patient Gender: F                HR:           113 bpm. Exam Location:  [facility] Procedure: 2D Echo, Cardiac Doppler, Color Doppler, Strain Analysis and            Intracardiac Opacification Agent Indications:     Chemo Z09  History:         Patient has no prior history of Echocardiogram examinations.  Cancer pt. malignant pleural eff.  Sonographer:     Sherrie Sport RDCS (AE) Referring Phys:  [place] Diagnosing Phys: Yolonda Kida MD  Sonographer Comments: Suboptimal parasternal window. Global longitudinal strain was attempted. IMPRESSIONS  1. Left ventricular ejection fraction, by estimation, is 55 to 60%. The left ventricle has normal function. The left ventricle has no regional wall motion abnormalities. Left ventricular diastolic parameters were normal.  2. Right ventricular systolic function is normal. The right ventricular size is normal.  3. The mitral valve is normal in structure. No evidence of mitral valve regurgitation.  4. The aortic valve is normal in structure. Aortic valve regurgitation is not visualized. Conclusion(s)/Recommendation(s): Poor windows for evaluation of left ventricular function by transthoracic echocardiography. Would recommend an alternative means of evaluation. FINDINGS  Left Ventricle: Left ventricular ejection fraction, by estimation, is 55 to 60%. The left ventricle has normal function. The left ventricle has no regional wall motion abnormalities. Definity contrast agent was given IV to delineate the left ventricular  endocardial borders. The left ventricular internal cavity size was normal in size. There is no  left ventricular hypertrophy. Left ventricular diastolic parameters were normal. Right Ventricle: The right ventricular size is normal. No increase in right ventricular wall thickness. Right ventricular systolic function is normal. Left Atrium: Left atrial size was normal in size. Right Atrium: Right atrial size was normal in size. Pericardium: Trivial pericardial effusion is present. Mitral Valve: The mitral valve is normal in structure. No evidence of mitral valve regurgitation. Tricuspid Valve: The tricuspid valve is normal in structure. Tricuspid valve regurgitation is trivial. Aortic Valve: The aortic valve is normal in structure. Aortic valve regurgitation is not visualized. Aortic valve mean gradient measures 3.5 mmHg. Aortic valve peak gradient measures 6.4 mmHg. Aortic valve area, by VTI measures 2.91 cm. Pulmonic Valve: The pulmonic valve was normal in structure. Pulmonic valve regurgitation is not visualized. Aorta: The ascending aorta was not well visualized. IAS/Shunts: No atrial level shunt detected by color flow Doppler.  LEFT VENTRICLE PLAX 2D LVIDd:         3.59 cm  Diastology LVIDs:         2.58 cm  LV e' medial:    11.50 cm/s LV PW:         1.25 cm  LV E/e' medial:  8.0 LV IVS:        0.77 cm  LV e' lateral:   10.30 cm/s LVOT diam:     2.20 cm  LV E/e' lateral: 8.9 LV SV:         52 LV SV Index:   25 LVOT Area:     3.80 cm  RIGHT VENTRICLE RV Basal diam:  2.42 cm RV S prime:     15.60 cm/s TAPSE (M-mode): 4.0 cm LEFT ATRIUM           Index       RIGHT ATRIUM          Index LA diam:      2.40 cm 1.17 cm/m  RA Area:     7.95 cm LA Vol (A2C): 16.0 ml 7.77 ml/m  RA Volume:   13.00 ml 6.32 ml/m LA Vol (A4C): 32.7 ml 15.89 ml/m  AORTIC VALVE AV Area (Vmax):    3.08 cm AV Area (Vmean):   2.87 cm AV Area (VTI):     2.91 cm AV Vmax:           126.00 cm/s AV Vmean:  85.100 cm/s AV VTI:            0.180 m AV Peak Grad:      6.4 mmHg AV Mean Grad:      3.5 mmHg LVOT Vmax:         102.00 cm/s  LVOT Vmean:        64.300 cm/s LVOT VTI:          0.138 m LVOT/AV VTI ratio: 0.76  AORTA Ao Root diam: 3.35 cm MITRAL VALVE               TRICUSPID VALVE MV Area (PHT): 4.49 cm    TR Peak grad:   16.6 mmHg MV Decel Time: 169 msec    TR Vmax:        204.00 cm/s MV E velocity: 91.70 cm/s MV A velocity: 61.30 cm/s  SHUNTS MV E/A ratio:  1.50        Systemic VTI:  0.14 m                            Systemic Diam: 2.20 cm Dwayne Prince [place] MD Electronically signed by Yolonda Kida MD Signature Date/Time: 11/19/2020/5:59:27 PM    Final     ASSESSMENT: 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   PLAN:    1.  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  Bone marrow biopsy is negative for lymphoma.  Cardiac echo completed on November 19, 2020 revealed an EF of 55 to 60%.  Repeat in June 2022.  Proceed with cycle 2 of Truxima plus CHOP today.  Return to clinic in 2 days for Udenyca injection and then in 3 weeks for further evaluation and consideration of cycle 3.   2.  Shortness of breath/cough: Resolved.  Pleurx catheter is no longer draining fluid.  Patient is scheduled to have catheter removed on Friday, December 13, 2020.   3.  Pain: Secondary to Pleurx catheter.  Removal this Friday as above.   4.  Headache: Resolved.  MRI of the brain on October 30, 2020 did not report any significant pathology. 5.  Right arm DVT: Continue Eliquis as prescribed at least through the duration of her treatments.  Okay to use port.  6.  Leukopenia: Resolved.  Udenyca as above.  Patient expressed understanding and was in agreement with this plan. She also understands that She can call clinic at any time with any questions, concerns, or complaints.   Cancer Staging Mediastinal (thymic) large B-cell lymphoma ([place]) Staging form: Hodgkin and Non-Hodgkin Lymphoma, AJCC 8th Edition -  Clinical stage from 11/13/2020: Stage II bulky (Diffuse large B-cell lymphoma) - Signed by Lloyd Huger, MD on 11/13/2020   Lloyd Huger, MD   12/11/2020 1:58 PM

## 2020-12-09 ENCOUNTER — Telehealth: Payer: Self-pay | Admitting: *Deleted

## 2020-12-09 DIAGNOSIS — J90 Pleural effusion, not elsewhere classified: Secondary | ICD-10-CM

## 2020-12-09 NOTE — Telephone Encounter (Signed)
The last time I drained it was on 3/16 and collected 331ml at that time.

## 2020-12-09 NOTE — Telephone Encounter (Signed)
Pt is having discomfort with deep breaths where her pleurx catheter is placed. Pt is asking if could be tentatively scheduled to have it removed later this week.   Please advise.

## 2020-12-09 NOTE — Telephone Encounter (Signed)
Great. Lets get it out and see how she does.

## 2020-12-09 NOTE — Telephone Encounter (Signed)
Will do. Orders have been placed and pt will be notified once scheduled.

## 2020-12-09 NOTE — Telephone Encounter (Signed)
That's probably fine.  Have you drained it all this past week?

## 2020-12-11 ENCOUNTER — Inpatient Hospital Stay: Payer: 59

## 2020-12-11 ENCOUNTER — Inpatient Hospital Stay (HOSPITAL_BASED_OUTPATIENT_CLINIC_OR_DEPARTMENT_OTHER): Payer: 59 | Admitting: Oncology

## 2020-12-11 ENCOUNTER — Encounter: Payer: Self-pay | Admitting: Oncology

## 2020-12-11 ENCOUNTER — Encounter: Payer: Self-pay | Admitting: *Deleted

## 2020-12-11 VITALS — BP 95/60 | HR 112 | Temp 98.6°F | Resp 16

## 2020-12-11 VITALS — BP 118/81 | HR 100 | Temp 97.6°F | Resp 16 | Wt 203.4 lb

## 2020-12-11 DIAGNOSIS — Z5112 Encounter for antineoplastic immunotherapy: Secondary | ICD-10-CM | POA: Diagnosis not present

## 2020-12-11 DIAGNOSIS — C8520 Mediastinal (thymic) large B-cell lymphoma, unspecified site: Secondary | ICD-10-CM

## 2020-12-11 LAB — CBC WITH DIFFERENTIAL/PLATELET
Abs Immature Granulocytes: 0.09 10*3/uL — ABNORMAL HIGH (ref 0.00–0.07)
Basophils Absolute: 0.1 10*3/uL (ref 0.0–0.1)
Basophils Relative: 2 %
Eosinophils Absolute: 0 10*3/uL (ref 0.0–0.5)
Eosinophils Relative: 1 %
HCT: 39.3 % (ref 36.0–46.0)
Hemoglobin: 12.8 g/dL (ref 12.0–15.0)
Immature Granulocytes: 2 %
Lymphocytes Relative: 16 %
Lymphs Abs: 0.6 10*3/uL — ABNORMAL LOW (ref 0.7–4.0)
MCH: 28.7 pg (ref 26.0–34.0)
MCHC: 32.6 g/dL (ref 30.0–36.0)
MCV: 88.1 fL (ref 80.0–100.0)
Monocytes Absolute: 0.6 10*3/uL (ref 0.1–1.0)
Monocytes Relative: 14 %
Neutro Abs: 2.6 10*3/uL (ref 1.7–7.7)
Neutrophils Relative %: 65 %
Platelets: 354 10*3/uL (ref 150–400)
RBC: 4.46 MIL/uL (ref 3.87–5.11)
RDW: 15 % (ref 11.5–15.5)
WBC: 4 10*3/uL (ref 4.0–10.5)
nRBC: 0 % (ref 0.0–0.2)

## 2020-12-11 LAB — COMPREHENSIVE METABOLIC PANEL
ALT: 27 U/L (ref 0–44)
AST: 31 U/L (ref 15–41)
Albumin: 4 g/dL (ref 3.5–5.0)
Alkaline Phosphatase: 83 U/L (ref 38–126)
Anion gap: 7 (ref 5–15)
BUN: 15 mg/dL (ref 6–20)
CO2: 29 mmol/L (ref 22–32)
Calcium: 9.3 mg/dL (ref 8.9–10.3)
Chloride: 102 mmol/L (ref 98–111)
Creatinine, Ser: 0.55 mg/dL (ref 0.44–1.00)
GFR, Estimated: >60 mL/min (ref >60)
Glucose, Bld: 89 mg/dL (ref 70–99)
Potassium: 4.1 mmol/L (ref 3.5–5.1)
Sodium: 138 mmol/L (ref 135–145)
Total Bilirubin: 0.7 mg/dL (ref 0.3–1.2)
Total Protein: 7.1 g/dL (ref 6.5–8.1)

## 2020-12-11 MED ORDER — PALONOSETRON HCL INJECTION 0.25 MG/5ML
0.2500 mg | Freq: Once | INTRAVENOUS | Status: AC
Start: 2020-12-11 — End: 2020-12-11
  Administered 2020-12-11: 0.25 mg via INTRAVENOUS
  Filled 2020-12-11: qty 5

## 2020-12-11 MED ORDER — DIPHENHYDRAMINE HCL 25 MG PO CAPS
25.0000 mg | ORAL_CAPSULE | Freq: Once | ORAL | Status: AC
  Administered 2020-12-11: 25 mg via ORAL
  Filled 2020-12-11: qty 1

## 2020-12-11 MED ORDER — SODIUM CHLORIDE 0.9% FLUSH
10.0000 mL | Freq: Once | INTRAVENOUS | Status: AC
  Administered 2020-12-11: 10 mL via INTRAVENOUS
  Filled 2020-12-11: qty 10

## 2020-12-11 MED ORDER — SODIUM CHLORIDE 0.9 % IV SOLN
750.0000 mg/m2 | Freq: Once | INTRAVENOUS | Status: AC
  Administered 2020-12-11: 1600 mg via INTRAVENOUS
  Filled 2020-12-11: qty 80

## 2020-12-11 MED ORDER — ACETAMINOPHEN 325 MG PO TABS
650.0000 mg | ORAL_TABLET | Freq: Once | ORAL | Status: AC
  Administered 2020-12-11: 650 mg via ORAL
  Filled 2020-12-11: qty 2

## 2020-12-11 MED ORDER — SODIUM CHLORIDE 0.9 % IV SOLN
150.0000 mg | Freq: Once | INTRAVENOUS | Status: AC
  Administered 2020-12-11: 150 mg via INTRAVENOUS
  Filled 2020-12-11: qty 150

## 2020-12-11 MED ORDER — HEPARIN SOD (PORK) LOCK FLUSH 100 UNIT/ML IV SOLN
INTRAVENOUS | Status: AC
  Filled 2020-12-11: qty 5

## 2020-12-11 MED ORDER — SODIUM CHLORIDE 0.9 % IV SOLN
Freq: Once | INTRAVENOUS | Status: AC
  Filled 2020-12-11: qty 250

## 2020-12-11 MED ORDER — SODIUM CHLORIDE 0.9 % IV SOLN
375.0000 mg/m2 | Freq: Once | INTRAVENOUS | Status: AC
  Administered 2020-12-11: 800 mg via INTRAVENOUS
  Filled 2020-12-11: qty 50

## 2020-12-11 MED ORDER — DEXAMETHASONE SODIUM PHOSPHATE 100 MG/10ML IJ SOLN
10.0000 mg | Freq: Once | INTRAMUSCULAR | Status: AC
  Administered 2020-12-11: 10 mg via INTRAVENOUS
  Filled 2020-12-11: qty 10

## 2020-12-11 MED ORDER — VINCRISTINE SULFATE CHEMO INJECTION 1 MG/ML
2.0000 mg | Freq: Once | INTRAVENOUS | Status: AC
  Administered 2020-12-11: 2 mg via INTRAVENOUS
  Filled 2020-12-11: qty 2

## 2020-12-11 MED ORDER — HEPARIN SOD (PORK) LOCK FLUSH 100 UNIT/ML IV SOLN
500.0000 [IU] | Freq: Once | INTRAVENOUS | Status: AC
  Administered 2020-12-11: 500 [IU] via INTRAVENOUS
  Filled 2020-12-11: qty 5

## 2020-12-11 MED ORDER — DOXORUBICIN HCL CHEMO IV INJECTION 2 MG/ML
47.0000 mg/m2 | Freq: Once | INTRAVENOUS | Status: AC
Start: 2020-12-11 — End: 2020-12-11
  Administered 2020-12-11: 100 mg via INTRAVENOUS
  Filled 2020-12-11: qty 50

## 2020-12-11 NOTE — Progress Notes (Signed)
  Oncology Nurse Navigator Documentation  Navigator Location: [facility] (12/11/20 1100)   )Navigator Encounter Type: Follow-up Appt (12/11/20 1100)                     Patient Visit Type: MedOnc (12/11/20 1100) Treatment Phase: Treatment (12/11/20 1100) Barriers/Navigation Needs: No Barriers At This Time (12/11/20 1100)   Interventions: Coordination of Care (12/11/20 1100)   Coordination of Care: Appts;Radiology (12/11/20 1100)      Specialty Items/DME: Pleur X catheter (12/11/20 1100)    met with patient during follow up visit and infusion to assist with drainage of pleurx catheter today. Pt tolerated well. No drainage collected in canister today. Pt scheduled for removal of pleurx catheter on Friday. Pt voiced concerns about finances and hospital bills. Informed that can provide application for financial assistance. All other questions answered during visit. Reviewed upcoming appts with patient. Instructed to call with any further questions.        Time Spent with Patient: 60 (12/11/20 1100)

## 2020-12-11 NOTE — Progress Notes (Signed)
Patient to receive Truxima for first time today. Per Debbora Lacrosse, patient to receive Truxima as first time Rituxan infusion. Patient titrated per first time Rituxan protocol. Patient tolerated well and feels fine. Final HR 112-114. Dr. Rogue Bussing made aware and okay for patient to discharge home. Educated patient to contact clinic should she have any questions or concerns and when to seek emergency care. Patient verbalizes understanding and denies any further questions or concerns.

## 2020-12-11 NOTE — Progress Notes (Signed)
Patient here for pre treatment check. No concerns today.

## 2020-12-12 ENCOUNTER — Institutional Professional Consult (permissible substitution): Payer: 59 | Admitting: Pulmonary Disease

## 2020-12-12 NOTE — Progress Notes (Signed)
Patient on schedule to have Pleurx catheter removed 4/1, spoke with patient on phone and made aware to be here @ 1000, no sedation/local anesthetic,stated understanding.

## 2020-12-13 ENCOUNTER — Other Ambulatory Visit: Payer: Self-pay | Admitting: *Deleted

## 2020-12-13 ENCOUNTER — Ambulatory Visit
Admission: RE | Admit: 2020-12-13 | Discharge: 2020-12-13 | Disposition: A | Discharge status: Home / Self Care | Payer: 59 | Source: Ambulatory Visit | Attending: Oncology | Admitting: Oncology

## 2020-12-13 ENCOUNTER — Other Ambulatory Visit: Payer: Self-pay

## 2020-12-13 ENCOUNTER — Inpatient Hospital Stay: Payer: 59 | Attending: Oncology

## 2020-12-13 ENCOUNTER — Ambulatory Visit: Payer: 59

## 2020-12-13 ENCOUNTER — Inpatient Hospital Stay (HOSPITAL_BASED_OUTPATIENT_CLINIC_OR_DEPARTMENT_OTHER): Payer: 59 | Admitting: Hospice and Palliative Medicine

## 2020-12-13 VITALS — BP 114/69 | HR 106 | Temp 98.8°F | Resp 20

## 2020-12-13 DIAGNOSIS — Z4682 Encounter for fitting and adjustment of non-vascular catheter: Secondary | ICD-10-CM | POA: Insufficient documentation

## 2020-12-13 DIAGNOSIS — Z5111 Encounter for antineoplastic chemotherapy: Secondary | ICD-10-CM | POA: Insufficient documentation

## 2020-12-13 DIAGNOSIS — C8520 Mediastinal (thymic) large B-cell lymphoma, unspecified site: Secondary | ICD-10-CM | POA: Diagnosis not present

## 2020-12-13 DIAGNOSIS — Z7689 Persons encountering health services in other specified circumstances: Secondary | ICD-10-CM | POA: Diagnosis not present

## 2020-12-13 DIAGNOSIS — F419 Anxiety disorder, unspecified: Secondary | ICD-10-CM | POA: Diagnosis not present

## 2020-12-13 DIAGNOSIS — Z5189 Encounter for other specified aftercare: Secondary | ICD-10-CM | POA: Insufficient documentation

## 2020-12-13 DIAGNOSIS — F32A Depression, unspecified: Secondary | ICD-10-CM | POA: Diagnosis not present

## 2020-12-13 DIAGNOSIS — J90 Pleural effusion, not elsewhere classified: Secondary | ICD-10-CM | POA: Insufficient documentation

## 2020-12-13 DIAGNOSIS — Z515 Encounter for palliative care: Secondary | ICD-10-CM | POA: Diagnosis not present

## 2020-12-13 DIAGNOSIS — Z5112 Encounter for antineoplastic immunotherapy: Secondary | ICD-10-CM | POA: Insufficient documentation

## 2020-12-13 DIAGNOSIS — C8529 Mediastinal (thymic) large B-cell lymphoma, extranodal and solid organ sites: Secondary | ICD-10-CM | POA: Insufficient documentation

## 2020-12-13 HISTORY — PX: IR REMOVAL OF PLURAL CATH W/CUFF: IMG5346

## 2020-12-13 MED ORDER — MIRTAZAPINE 15 MG PO TABS: 15.0000 mg | ORAL_TABLET | Freq: Every day | ORAL | 2 refills | Status: DC

## 2020-12-13 MED ORDER — PEGFILGRASTIM-CBQV 6 MG/0.6ML ~~LOC~~ SOSY
6.0000 mg | PREFILLED_SYRINGE | Freq: Once | SUBCUTANEOUS | Status: AC
  Administered 2020-12-13: 6 mg via SUBCUTANEOUS
  Filled 2020-12-13: qty 0.6

## 2020-12-13 NOTE — Progress Notes (Addendum)
[facility]  [phone] [phone]   Name: Jennifer Cole Date: 12/13/2020 MRN: [id number]  DOB: [birth year]  Patient Care Team: Patient, [name] (Inactive) as PCP - General ([place]) Telford Nab, RN as Oncology Nurse Navigator    REASON FOR CONSULTATION: Jennifer Cole is a 55 y.o. 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  Patient has had depression and anxiety since her cancer diagnosis.  She also has unresolved grief following the loss of her daughter in 2019/12/03.  Patient was referred to palliative care to help address goals and manage ongoing symptoms.  SOCIAL HISTORY:     reports that she has never smoked. She has never used smokeless tobacco. She reports that she does not drink alcohol.  Patient is unmarried.  She lives with a boyfriend since 2012/12/02.  Her son also lives in the home.  She has a daughter who lives nearby.  Patient had another daughter who died at age 12 from cancer in 12/03/19.  Patient previously worked at the [employer].  She is now trying to get disability.  ADVANCE DIRECTIVES:  Does not have  CODE STATUS:   PAST MEDICAL HISTORY: Past Medical History:  Diagnosis Date  . Cancer ([place])    cervical    PAST SURGICAL HISTORY:  Past Surgical History:  Procedure Laterality Date  . IR PERC PLEURAL DRAIN W/INDWELL CATH W/IMG GUIDE  11/11/2020  . IR REMOVAL OF PLURAL CATH W/CUFF  12/13/2020  . 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  MEDICATIONS:  Current Outpatient Medications  Medication Sig Dispense Refill  . acetaminophen (TYLENOL) 500 MG tablet Take 1 tablet (500 mg total) by mouth every 6 (six) hours as needed. 30 tablet 0  . allopurinol (ZYLOPRIM) 300 MG tablet Take 1 tablet (300 mg total) by mouth daily. 30 tablet 3  . APIXABAN (ELIQUIS) VTE STARTER PACK (10MG AND 5MG) Take as directed on package: start with two-97m tablets twice daily for 7 days. On day 8, switch to one-557mtablet twice daily. 1 each 0  . diphenoxylate-atropine (LOMOTIL) 2.5-0.025 MG tablet Take 1 tablet by mouth 4 (four) times daily as needed for diarrhea or loose stools. 30 tablet 1  . folic acid (FOLVITE) 1 MG tablet Take 1 mg by mouth daily.    . Marland Kitchenbuprofen (ADVIL) 600 MG tablet Take 1 tablet (600 mg total) by mouth every 6 (six) hours as needed. 30 tablet 0  . lidocaine-prilocaine (EMLA) cream Apply to affected area once 30 g 3  . ondansetron (ZOFRAN) 8 MG tablet Take 1 tablet (8 mg total) by mouth 2 (two) times daily as needed for refractory nausea / vomiting. 60 tablet 2  .  predniSONE (DELTASONE) 20 MG tablet Take 5 tablets (100 mg total) by mouth daily. Take with food on days 1-5 of chemotherapy. 25 tablet 4  . prochlorperazine (COMPAZINE) 10 MG tablet Take 1 tablet (10 mg total) by mouth every 6 (six) hours as needed (Nausea or vomiting). 60 tablet 2  . magic mouthwash SOLN Take 5 mLs by mouth 4 (four) times daily. (Patient not taking: No sig reported) 240 mL 2   No current facility-administered medications for this visit.   Facility-Administered Medications Ordered in Other Visits   Medication Dose Route Frequency Provider Last Rate Last Admin  . pegfilgrastim-cbqv (UDENYCA) injection 6 mg  6 mg Subcutaneous Once Lloyd Huger, MD        VITAL SIGNS: There were no vitals taken for this visit. There were no vitals filed for this visit.  Estimated body mass index is 32.83 kg/m as calculated from the following:   Height as of 11/18/20: '5\' 6"'  (1.676 m).   Weight as of 12/11/20: 203 lb 6.4 oz (92.3 kg).  LABS: CBC:    Component Value Date/Time   WBC 4.0 12/11/2020 0942   HGB 12.8 12/11/2020 0942   HCT 39.3 12/11/2020 0942   PLT 354 12/11/2020 0942   MCV 88.1 12/11/2020 0942   NEUTROABS 2.6 12/11/2020 0942   LYMPHSABS 0.6 (L) 12/11/2020 0942   MONOABS 0.6 12/11/2020 0942   EOSABS 0.0 12/11/2020 0942   BASOSABS 0.1 12/11/2020 0942   Comprehensive Metabolic Panel:    Component Value Date/Time   NA 138 12/11/2020 0942   K 4.1 12/11/2020 0942   CL 102 12/11/2020 0942   CO2 29 12/11/2020 0942   BUN 15 12/11/2020 0942   CREATININE 0.55 12/11/2020 0942   GLUCOSE 89 12/11/2020 0942   CALCIUM 9.3 12/11/2020 0942   AST 31 12/11/2020 0942   ALT 27 12/11/2020 0942   ALKPHOS 83 12/11/2020 0942   BILITOT 0.7 12/11/2020 0942   PROT 7.1 12/11/2020 0942   ALBUMIN 4.0 12/11/2020 0942    RADIOGRAPHIC STUDIES: PERIPHERAL VASCULAR CATHETERIZATION  Result Date: 11/18/2020 See op note  US Venous Img Upper Uni Right  Result Date: 11/20/2020 CLINICAL DATA:  Right upper extremity edema.  Evaluate for DVT. EXAM: RIGHT UPPER EXTREMITY VENOUS DOPPLER ULTRASOUND TECHNIQUE: Gray-scale sonography with graded compression, as well as color Doppler and duplex ultrasound were performed to evaluate the upper extremity deep venous system from the level of the subclavian vein and including the jugular, axillary, basilic, radial, ulnar and upper cephalic vein. Spectral Doppler was utilized to evaluate flow at rest and with distal augmentation maneuvers. COMPARISON:  None. FINDINGS:  Contralateral Subclavian Vein: Respiratory phasicity is normal and symmetric with the symptomatic side. No evidence of thrombus. Normal compressibility. Internal Jugular Vein: No evidence of thrombus. Normal compressibility, respiratory phasicity and response to augmentation. Subclavian Vein: No evidence of thrombus. Normal compressibility, respiratory phasicity and response to augmentation. Axillary Vein: No evidence of thrombus. Normal compressibility, respiratory phasicity and response to augmentation. Cephalic Vein: No evidence of thrombus. Normal compressibility, respiratory phasicity and response to augmentation. Basilic Vein: No evidence of thrombus. Normal compressibility, respiratory phasicity and response to augmentation. Brachial Veins: There is hypoechoic occlusive thrombus involving one of the paired brachial veins (images 21 and 26). The adjacent paired brachial vein appears widely patent. Radial Veins: No evidence of thrombus. Normal compressibility, respiratory phasicity and response to augmentation. Ulnar Veins: No evidence of thrombus. Normal compressibility, respiratory phasicity and response to augmentation. Other Findings:  None visualized. IMPRESSION:  Examination is positive for occlusive DVT involving one of the paired right brachial veins. There is no extension of this short-segment occlusive DVT to the more proximal venous system of the right upper extremity Electronically Signed   By: Sandi Mariscal M.D.   On: 11/20/2020 11:13   CT BONE MARROW BIOPSY & ASPIRATION  Result Date: 11/18/2020 INDICATION: Recent diagnosis of B-cell lymphoma. Please perform CT-guided bone marrow biopsy for tissue diagnostic purposes. EXAM: CT-GUIDED BONE MARROW BIOPSY AND ASPIRATION MEDICATIONS: None ANESTHESIA/SEDATION: Fentanyl 100 mcg IV; Versed 3 mg IV Sedation Time: 12 Minutes; The patient was continuously monitored during the procedure by the interventional radiology nurse under my direct supervision.  COMPLICATIONS: None immediate. PROCEDURE: Informed consent was obtained from the patient following an explanation of the procedure, risks, benefits and alternatives. The patient understands, agrees and consents for the procedure. All questions were addressed. A time out was performed prior to the initiation of the procedure. The patient was positioned left lateral decubitus and non-contrast localization CT was performed of the pelvis to demonstrate the iliac marrow spaces. The operative site was prepped and draped in the usual sterile fashion. Under sterile conditions and local anesthesia, a 22 gauge spinal needle was utilized for procedural planning. Next, an 11 gauge coaxial bone biopsy needle was advanced into the left iliac marrow space. Needle position was confirmed with CT imaging. Initially, a bone marrow aspiration was performed. Next, a bone marrow biopsy was obtained with the 11 gauge outer bone marrow device. The 11 gauge coaxial bone biopsy needle was re-advanced into a slightly different location within the left iliac marrow space, positioning was confirmed with CT imaging and an additional bone marrow biopsy was obtained. The needle was removed and superficial hemostasis was obtained with manual compression. A dressing was applied. The patient tolerated the procedure well without immediate post procedural complication. IMPRESSION: Successful CT guided left iliac bone marrow aspiration and core biopsy. Electronically Signed   By: Sandi Mariscal M.D.   On: 11/18/2020 11:18   ECHOCARDIOGRAM COMPLETE  Result Date: 11/19/2020    ECHOCARDIOGRAM REPORT   Patient Name:   Jennifer Cole Date of Exam: 11/19/2020 Medical Rec #:  [id number]        Height:       66.0 in Accession #:    [id number]       Weight:       214.0 lb Date of Birth:  Feb 04, 1966        BSA:          2.058 m Patient Age:    40 years         BP:           99/64 mmHg Patient Gender: F                HR:           113 bpm. Exam Location:  [facility]  Procedure: 2D Echo, Cardiac Doppler, Color Doppler, Strain Analysis and            Intracardiac Opacification Agent Indications:     Chemo Z09  History:         Patient has no prior history of Echocardiogram examinations.                  Cancer pt. malignant pleural eff.  Sonographer:     Sherrie Sport RDCS (AE) Referring Phys:  [place] Diagnosing Phys: Yolonda Kida MD  Sonographer Comments: Suboptimal parasternal window. Global  longitudinal strain was attempted. IMPRESSIONS  1. Left ventricular ejection fraction, by estimation, is 55 to 60%. The left ventricle has normal function. The left ventricle has no regional wall motion abnormalities. Left ventricular diastolic parameters were normal.  2. Right ventricular systolic function is normal. The right ventricular size is normal.  3. The mitral valve is normal in structure. No evidence of mitral valve regurgitation.  4. The aortic valve is normal in structure. Aortic valve regurgitation is not visualized. Conclusion(s)/Recommendation(s): Poor windows for evaluation of left ventricular function by transthoracic echocardiography. Would recommend an alternative means of evaluation. FINDINGS  Left Ventricle: Left ventricular ejection fraction, by estimation, is 55 to 60%. The left ventricle has normal function. The left ventricle has no regional wall motion abnormalities. Definity contrast agent was given IV to delineate the left ventricular  endocardial Hiawatha Dressel. The left ventricular internal cavity size was normal in size. There is no left ventricular hypertrophy. Left ventricular diastolic parameters were normal. Right Ventricle: The right ventricular size is normal. No increase in right ventricular wall thickness. Right ventricular systolic function is normal. Left Atrium: Left atrial size was normal in size. Right Atrium: Right atrial size was normal in size. Pericardium: Trivial pericardial effusion is present. Mitral Valve: The mitral valve is  normal in structure. No evidence of mitral valve regurgitation. Tricuspid Valve: The tricuspid valve is normal in structure. Tricuspid valve regurgitation is trivial. Aortic Valve: The aortic valve is normal in structure. Aortic valve regurgitation is not visualized. Aortic valve mean gradient measures 3.5 mmHg. Aortic valve peak gradient measures 6.4 mmHg. Aortic valve area, by VTI measures 2.91 cm. Pulmonic Valve: The pulmonic valve was normal in structure. Pulmonic valve regurgitation is not visualized. Aorta: The ascending aorta was not well visualized. IAS/Shunts: No atrial level shunt detected by color flow Doppler.  LEFT VENTRICLE PLAX 2D LVIDd:         3.59 cm  Diastology LVIDs:         2.58 cm  LV e' medial:    11.50 cm/s LV PW:         1.25 cm  LV E/e' medial:  8.0 LV IVS:        0.77 cm  LV e' lateral:   10.30 cm/s LVOT diam:     2.20 cm  LV E/e' lateral: 8.9 LV SV:         52 LV SV Index:   25 LVOT Area:     3.80 cm  RIGHT VENTRICLE RV Basal diam:  2.42 cm RV S prime:     15.60 cm/s TAPSE (M-mode): 4.0 cm LEFT ATRIUM           Index       RIGHT ATRIUM          Index LA diam:      2.40 cm 1.17 cm/m  RA Area:     7.95 cm LA Vol (A2C): 16.0 ml 7.77 ml/m  RA Volume:   13.00 ml 6.32 ml/m LA Vol (A4C): 32.7 ml 15.89 ml/m  AORTIC VALVE AV Area (Vmax):    3.08 cm AV Area (Vmean):   2.87 cm AV Area (VTI):     2.91 cm AV Vmax:           126.00 cm/s AV Vmean:          85.100 cm/s AV VTI:            0.180 m AV Peak Grad:      6.4 mmHg AV  Mean Grad:      3.5 mmHg LVOT Vmax:         102.00 cm/s LVOT Vmean:        64.300 cm/s LVOT VTI:          0.138 m LVOT/AV VTI ratio: 0.76  AORTA Ao Root diam: 3.35 cm MITRAL VALVE               TRICUSPID VALVE MV Area (PHT): 4.49 cm    TR Peak grad:   16.6 mmHg MV Decel Time: 169 msec    TR Vmax:        204.00 cm/s MV E velocity: 91.70 cm/s MV A velocity: 61.30 cm/s  SHUNTS MV E/A ratio:  1.50        Systemic VTI:  0.14 m                            Systemic Diam: 2.20 cm  Yolonda Kida MD Electronically signed by Yolonda Kida MD Signature Date/Time: 11/19/2020/5:59:27 PM    Final    IR Removal Of Plural Cath W/Cuff  Result Date: 12/13/2020 INDICATION: Patient with a history of B-cell lymphoma and pleural effusions with tunneled pleural catheter in place. Patient requests for tunneled pleural catheter to be removed due to discomfort. EXAM: Removal of right tunneled pleural catheter. MEDICATIONS: 1% lidocaine 10 mL ANESTHESIA/SEDATION: None COMPLICATIONS: None immediate. PROCEDURE: Informed written consent was obtained from the patient after a thorough discussion of the procedural risks, benefits and alternatives. All questions were addressed. Sterile Barrier Technique was utilized including sterile gloves, sterile drape, hand hygiene and skin antiseptic. A timeout was performed prior to the initiation of the procedure. The site was cleaned and prepped in the normal sterile fashion. The suture was cut and removed in its entirety. Approximately 10 mL of lidocaine was injected around the catheter insertion site. The cuff was located and gently dissected using tweezers. Using moderate manual traction the tunneled pleural catheter was removed. During the removal process Vaseline gauze was held over the skin insertion site. Once hemostasis was achieved the site was covered with fresh Vaseline gauze. Regular gauze was placed over this and secured with tape. IMPRESSION: Successful removal of right chest tunneled pleural catheter. Read by: Soyla Dryer, NP Electronically Signed   By: Aletta Edouard M.D.   On: 12/13/2020 12:22    PERFORMANCE STATUS (ECOG) : 1 - Symptomatic but completely ambulatory  Review of Systems Unless otherwise noted, a complete review of systems is negative.  Physical Exam General: NAD Pulmonary: Unlabored Extremities: no edema, no joint deformities Skin: no rashes Neurological: Grossly nonfocal  IMPRESSION: I introduced palliative care  services and attempted to establish therapeutic rapport.  Symptomatically, patient reports improved breathing.  She had her Pleurx catheter removed this a.m.  She denies pain.  Patient endorses depressive symptoms including frequent tearfulness, insomnia, and anhedonia since her recent cancer diagnosis.  She became tearful as she described waking each morning and seeing hair loss on her pillow.  Appetite is poor at times.  She denies suicidal ideation.  She denies substance abuse, tobacco abuse, or alcohol use.  Patient feels safe at home.   Patient does have history of psychiatric hospitalization in 2013 for suicidal ideation. She ultimately spent several weeks in a facility. Patient says she became suicidal after CPS removed her children due to an unsafe living environment with the domestic partner she had at the time.  Patient had an outpatient psychiatrist for a while but was ultimately lost to follow-up.  She does not recall the name of her psychiatrist.  She also does not recall her medication regimen used at the time, although she says she was treated with antidepressants and anxiety medications.  She has not been on antidepressants for many years.  She denies a diagnosis of bipolar.  She does not appear to be having manic episodes, paranoia, or with active psychotic features.  Patient describes a good support system with her significant other at home.  She says that her daughter is also supportive.  She does not have close friends.  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  Patient's daughter died in 12/15/2019 from cancer at age 23.  She still appears to be grieving the loss of her daughter.  She says that she has pictures from her daughter's funeral on her phone, which she looks at frequently.  We discussed the option of grief counseling and patient agreed to self-referral at [facility].  She lives nearby their office in [place].   Patient is also in  agreement with outpatient psychiatry referral.  I feel like patient would be best served by outpatient psychiatry given her past history of requiring IVC.  However, will plan to go ahead and start oral antidepressant until patient can be seen by psychiatry.  PLAN: -Continue current scope of treatment -Referral for outpatient psychiatry -Self-referral for grief counseling -Start mirtazapine 15 mg nightly -Follow BMI -RTC in 3 weeks   Patient expressed understanding and was in agreement with this plan. She also understands that She can call the clinic at any time with any questions, concerns, or complaints.     Time Total: 35 minutes  Visit consisted of counseling and education dealing with the complex and emotionally intense issues of symptom management and palliative care in the setting of serious and potentially life-threatening illness.Greater than 50%  of this time was spent counseling and coordinating care related to the above assessment and plan.  Signed by: Altha Harm, PhD, NP-C

## 2020-12-13 NOTE — Progress Notes (Signed)
Instructed pt to pick up some claritin to help minimize any SE from the [facility]. Explained that she may experience fatigue and or bony joint pain.

## 2020-12-13 NOTE — Procedures (Signed)
IR requested to remove right chest tunneled pleural catheter due to patient discomfort. The risks and benefits of the procedure were discussed with the patient; written consent for catheter removal was obtained. The site was cleaned and prepped using sterile technique. The suture was removed and the skin insertion site was infiltrated with 10 ml of lidocaine. Using gentle blunt dissection the cuff of the catheter was loosened. Using moderate manual traction the catheter was removed in its entirety. Vaseline gauze was held over the site during removal of the catheter. Once hemostasis was achieved the site was covered with fresh vaseline gauze and topped with regular gauze and tape. The patient tolerated the procedure well.   She was instructed to leave the dressing on for 24 hours and to notify her doctor if she notices any redness, swelling, drainage or tenderness around the site.   Soyla Dryer, [place] [phone] 12/13/2020, 12:26 PM

## 2020-12-13 NOTE — Progress Notes (Signed)
Patient here today for palliative care evaluation regarding anxiety and depression.

## 2020-12-13 NOTE — Progress Notes (Signed)
Patient stable post Pleurx catheter removal per J [place]/PA, tolerated well. Gauze/xerofoam dressing applied post removal, no drg noted at this time. Discharge instructions given to patient per PA at that time.questions answered. Vitals stable post procedure,

## 2020-12-18 ENCOUNTER — Other Ambulatory Visit (HOSPITAL_COMMUNITY): Payer: Self-pay

## 2020-12-19 ENCOUNTER — Other Ambulatory Visit (HOSPITAL_COMMUNITY): Payer: Self-pay

## 2020-12-19 ENCOUNTER — Telehealth: Payer: Self-pay | Admitting: *Deleted

## 2020-12-19 DIAGNOSIS — C8520 Mediastinal (thymic) large B-cell lymphoma, unspecified site: Secondary | ICD-10-CM

## 2020-12-19 MED ORDER — ALLOPURINOL 300 MG PO TABS: 300.0000 mg | ORAL_TABLET | Freq: Every day | ORAL | 3 refills | Status: DC

## 2020-12-19 NOTE — Telephone Encounter (Signed)
Pt made aware. Refills sent into [facility] pharmacy to provide medication assistance through charitable funds.

## 2020-12-19 NOTE — Telephone Encounter (Signed)
Pt left message asking if needs to continue taking allopurinol?  Please advise.

## 2020-12-19 NOTE — Telephone Encounter (Signed)
Yes, let's keep her on it for now.  Thanks!

## 2020-12-23 ENCOUNTER — Other Ambulatory Visit: Payer: Self-pay | Admitting: Oncology

## 2020-12-23 ENCOUNTER — Telehealth: Payer: Self-pay | Admitting: *Deleted

## 2020-12-23 ENCOUNTER — Other Ambulatory Visit: Payer: Self-pay

## 2020-12-23 DIAGNOSIS — C8590 Non-Hodgkin lymphoma, unspecified, unspecified site: Secondary | ICD-10-CM

## 2020-12-23 DIAGNOSIS — R102 Pelvic and perineal pain: Secondary | ICD-10-CM

## 2020-12-23 MED ORDER — ELIQUIS 5 MG PO TABS
5.0000 mg | ORAL_TABLET | Freq: Two times a day (BID) | ORAL | 2 refills | Status: DC
  Filled 2020-12-23: qty 60, 30d supply, fill #0
  Filled 2021-01-27: qty 60, 30d supply, fill #1

## 2020-12-23 NOTE — Telephone Encounter (Signed)
Send the referral please!  [facility] can see her in the meantime if they are willing.

## 2020-12-23 NOTE — Telephone Encounter (Signed)
Pt left message that is having vaginal pain with strong odor. Asking if needs to see OB/GYN or if needs to be seen in [facility].   Pt does not have an established OB/GYN and will need referral.

## 2020-12-23 NOTE — Telephone Encounter (Signed)
Referral placed to [facility].   Jennifer Cole/Jennifer Cole- let me know if you would like to see in the meantime since I am not sure when she will be seen at [facility].

## 2020-12-24 ENCOUNTER — Other Ambulatory Visit: Payer: Self-pay

## 2020-12-29 NOTE — Progress Notes (Signed)
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  She had her Pleurx catheter removed several weeks ago and has not had any recurrence of shortness of breath.  She continues to have intermittent flank pain associated with her scar tissue as well as residual cough, but both are significantly improved.  She has no neurologic complaints.  She denies any recent fevers or illnesses..  She has a fair appetite, but denies weight loss.  She has no chest pain, shortness of breath, or hemoptysis.  She denies any nausea, vomiting, constipation, or diarrhea.  She has no urinary complaints.  Patient offers no further specific complaints today.  REVIEW OF SYSTEMS:   Review of Systems  Constitutional: Negative.  Negative for fever, malaise/fatigue and weight loss.  Respiratory: Negative for cough, hemoptysis and shortness of breath.   Cardiovascular: Negative.  Negative for chest pain and leg swelling.  Gastrointestinal: Negative.  Negative for abdominal pain and nausea.  Genitourinary: Negative.  Negative for dysuria.  Musculoskeletal: Negative.  Negative for back pain.  Skin: Negative.   Neurological: Negative.  Negative for dizziness, focal weakness, weakness and headaches.  Psychiatric/Behavioral: Negative.  The patient is not nervous/anxious.     As per HPI. Otherwise, a complete review of systems is negative.  PAST MEDICAL HISTORY: Past Medical History:  Diagnosis Date  . Cancer ([facility])    cervical    PAST SURGICAL HISTORY: Past Surgical History:  Procedure Laterality Date   . IR PERC PLEURAL DRAIN W/INDWELL CATH W/IMG GUIDE  11/11/2020  . IR REMOVAL OF PLURAL CATH W/CUFF  12/13/2020  . PORTA CATH INSERTION N/A 11/18/2020   Procedure: PORTA CATH INSERTION;  Surgeon: Algernon Huxley, MD;  Location: [facility];  Service: Cardiovascular;  Laterality: N/A;    FAMILY HISTORY: No family history on file.  ADVANCED DIRECTIVES (Y/N):  N  HEALTH MAINTENANCE: Social History   Tobacco Use  . Smoking status: Never Smoker  . Smokeless tobacco: Never Used  Substance Use Topics  . Alcohol use: Never     Colonoscopy:  PAP:  Bone density:  Lipid panel:  No Known Allergies  Current Outpatient Medications  Medication Sig Dispense Refill  . acetaminophen (TYLENOL) 500 MG tablet Take 1 tablet (500 mg total) by mouth every 6 (six) hours as needed. 30 tablet 0  . allopurinol (ZYLOPRIM) 300 MG tablet Take 1 tablet (300 mg total) by mouth daily. 30 tablet 3  . apixaban (ELIQUIS) 5 MG TABS tablet Take 1 tablet (5 mg total) by mouth 2 (two) times daily. 60 tablet 2  . diphenoxylate-atropine (LOMOTIL) 2.5-0.025 MG tablet Take 1 tablet by mouth 4 (four) times daily as needed for diarrhea or loose stools. 30 tablet 1  . folic acid (FOLVITE) 1 MG tablet Take 1 mg by mouth daily.    Marland Kitchen ibuprofen (ADVIL) 600 MG tablet Take 1 tablet (600 mg total) by mouth every 6 (six) hours as needed. 30 tablet 0  . lidocaine-prilocaine (EMLA) cream Apply to affected area once 30 g 3  . magic mouthwash SOLN Take 5 mLs by mouth 4 (four) times daily. 240 mL 2  .  mirtazapine (REMERON) 15 MG tablet Take 1 tablet (15 mg total) by mouth at bedtime. 30 tablet 2  . ondansetron (ZOFRAN) 8 MG tablet Take 1 tablet (8 mg total) by mouth 2 (two) times daily as needed for refractory nausea / vomiting. 60 tablet 2  . predniSONE (DELTASONE) 20 MG tablet Take 5 tablets (100 mg total) by mouth daily. Take with food on days 1-5 of chemotherapy. 25 tablet 4  . prochlorperazine (COMPAZINE) 10 MG tablet Take 1  tablet (10 mg total) by mouth every 6 (six) hours as needed (Nausea or vomiting). 60 tablet 2   No current facility-administered medications for this visit.    OBJECTIVE: Vitals:   01/01/21 0859  BP: 109/81  Pulse: (!) 120  Resp: 18  Temp: (!) 97.5 F (36.4 C)  SpO2: 100%     Body mass index is 33.96 kg/m.    ECOG FS:0 - Asymptomatic  General: Well-developed, well-nourished, no acute distress. Eyes: Pink conjunctiva, anicteric sclera. HEENT: Normocephalic, moist mucous membranes. Lungs: No audible wheezing or coughing. Heart: Regular rate and rhythm. Abdomen: Soft, nontender, no obvious distention. Musculoskeletal: No edema, cyanosis, or clubbing. Neuro: Alert, answering all questions appropriately. Cranial nerves grossly intact. Skin: No rashes or petechiae noted. Psych: Normal affect.  LAB RESULTS:  Lab Results  Component Value Date   NA 140 01/01/2021   K 4.1 01/01/2021   CL 105 01/01/2021   CO2 27 01/01/2021   GLUCOSE 98 01/01/2021   BUN 15 01/01/2021   CREATININE 0.63 01/01/2021   CALCIUM 9.3 01/01/2021   PROT 7.0 01/01/2021   ALBUMIN 3.9 01/01/2021   AST 43 (H) 01/01/2021   ALT 42 01/01/2021   ALKPHOS 93 01/01/2021   BILITOT 0.9 01/01/2021   GFRNONAA >60 01/01/2021    Lab Results  Component Value Date   WBC 5.4 01/01/2021   NEUTROABS 3.8 01/01/2021   HGB 12.1 01/01/2021   HCT 36.9 01/01/2021   MCV 89.6 01/01/2021   PLT 356 01/01/2021     STUDIES: IR Removal Of Plural Cath W/Cuff  Result Date: 12/13/2020 INDICATION: Patient with a history of B-cell lymphoma and pleural effusions with tunneled pleural catheter in place. Patient requests for tunneled pleural catheter to be removed due to discomfort. EXAM: Removal of right tunneled pleural catheter. MEDICATIONS: 1% lidocaine 10 mL ANESTHESIA/SEDATION: None COMPLICATIONS: None immediate. PROCEDURE: Informed written consent was obtained from the patient after a thorough discussion of the procedural risks,  benefits and alternatives. All questions were addressed. Sterile Barrier Technique was utilized including sterile gloves, sterile drape, hand hygiene and skin antiseptic. A timeout was performed prior to the initiation of the procedure. The site was cleaned and prepped in the normal sterile fashion. The suture was cut and removed in its entirety. Approximately 10 mL of lidocaine was injected around the catheter insertion site. The cuff was located and gently dissected using tweezers. Using moderate manual traction the tunneled pleural catheter was removed. During the removal process Vaseline gauze was held over the skin insertion site. Once hemostasis was achieved the site was covered with fresh Vaseline gauze. Regular gauze was placed over this and secured with tape. IMPRESSION: Successful removal of right chest tunneled pleural catheter. Read by: Soyla Dryer, NP Electronically Signed   By: Aletta Edouard M.D.   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   PLAN:    1.  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  Bone marrow biopsy is negative for lymphoma.  Cardiac echo completed on November 19, 2020 revealed an EF of 55 to 60%.  Repeat in June 2022.  Proceed with cycle 3 of Truxima plus CHOP today.  Return to clinic in 2 days for [facility] and then in 3 weeks for further evaluation and consideration of cycle 4.  Plan to reimage after cycle 4.  We will do a total of 6 cycles followed by consolidation XRT.   2.  Shortness of breath/cough: Resolved.  Pleurx catheter has been removed. 3.  Pain: Significantly improved. 4.  Headache: Resolved.  MRI of the brain on October 30, 2020 did not report any significant pathology. 5.  Right arm DVT: Continue Eliquis as prescribed at least through the duration of her treatments.  Okay to  use port.  6.  Leukopenia: Resolved.  Udenyca as above. 7.  Transaminitis: Patient's AST is only mildly elevated, monitor. 7.  Hypotension: Patient is asymptomatic.  She received 1 L of IV fluids today.  Patient expressed understanding and was in agreement with this plan. She also understands that She can call clinic at any time with any questions, concerns, or complaints.   Cancer Staging Mediastinal (thymic) large B-cell lymphoma ([place]) Staging form: Hodgkin and Non-Hodgkin Lymphoma, AJCC 8th Edition - Clinical stage from 11/13/2020: Stage II bulky (Diffuse large B-cell lymphoma) - Signed by Lloyd Huger, MD on 11/13/2020   Lloyd Huger, MD   01/01/2021 4:21 PM

## 2021-01-01 ENCOUNTER — Encounter: Payer: Self-pay | Admitting: Oncology

## 2021-01-01 ENCOUNTER — Other Ambulatory Visit: Payer: Self-pay

## 2021-01-01 ENCOUNTER — Inpatient Hospital Stay: Payer: 59

## 2021-01-01 ENCOUNTER — Inpatient Hospital Stay (HOSPITAL_BASED_OUTPATIENT_CLINIC_OR_DEPARTMENT_OTHER): Payer: 59 | Admitting: Oncology

## 2021-01-01 VITALS — BP 109/81 | HR 120 | Temp 97.5°F | Resp 18 | Wt 210.4 lb

## 2021-01-01 VITALS — BP 94/59 | HR 115 | Temp 98.6°F | Resp 18

## 2021-01-01 DIAGNOSIS — C8520 Mediastinal (thymic) large B-cell lymphoma, unspecified site: Secondary | ICD-10-CM | POA: Diagnosis not present

## 2021-01-01 DIAGNOSIS — Z5112 Encounter for antineoplastic immunotherapy: Secondary | ICD-10-CM | POA: Diagnosis not present

## 2021-01-01 LAB — COMPREHENSIVE METABOLIC PANEL
ALT: 42 U/L (ref 0–44)
AST: 43 U/L — ABNORMAL HIGH (ref 15–41)
Albumin: 3.9 g/dL (ref 3.5–5.0)
Alkaline Phosphatase: 93 U/L (ref 38–126)
Anion gap: 8 (ref 5–15)
BUN: 15 mg/dL (ref 6–20)
CO2: 27 mmol/L (ref 22–32)
Calcium: 9.3 mg/dL (ref 8.9–10.3)
Chloride: 105 mmol/L (ref 98–111)
Creatinine, Ser: 0.63 mg/dL (ref 0.44–1.00)
GFR, Estimated: >60 mL/min (ref >60)
Glucose, Bld: 98 mg/dL (ref 70–99)
Potassium: 4.1 mmol/L (ref 3.5–5.1)
Sodium: 140 mmol/L (ref 135–145)
Total Bilirubin: 0.9 mg/dL (ref 0.3–1.2)
Total Protein: 7 g/dL (ref 6.5–8.1)

## 2021-01-01 LAB — CBC WITH DIFFERENTIAL/PLATELET
Abs Immature Granulocytes: 0.05 10*3/uL (ref 0.00–0.07)
Basophils Absolute: 0.1 10*3/uL (ref 0.0–0.1)
Basophils Relative: 2 %
Eosinophils Absolute: 0.1 10*3/uL (ref 0.0–0.5)
Eosinophils Relative: 2 %
HCT: 36.9 % (ref 36.0–46.0)
Hemoglobin: 12.1 g/dL (ref 12.0–15.0)
Immature Granulocytes: 1 %
Lymphocytes Relative: 12 %
Lymphs Abs: 0.7 10*3/uL (ref 0.7–4.0)
MCH: 29.4 pg (ref 26.0–34.0)
MCHC: 32.8 g/dL (ref 30.0–36.0)
MCV: 89.6 fL (ref 80.0–100.0)
Monocytes Absolute: 0.7 10*3/uL (ref 0.1–1.0)
Monocytes Relative: 13 %
Neutro Abs: 3.8 10*3/uL (ref 1.7–7.7)
Neutrophils Relative %: 70 %
Platelets: 356 10*3/uL (ref 150–400)
RBC: 4.12 MIL/uL (ref 3.87–5.11)
RDW: 17.1 % — ABNORMAL HIGH (ref 11.5–15.5)
WBC: 5.4 10*3/uL (ref 4.0–10.5)
nRBC: 0 % (ref 0.0–0.2)

## 2021-01-01 MED ORDER — SODIUM CHLORIDE 0.9 % IV SOLN
Freq: Once | INTRAVENOUS | Status: AC
  Filled 2021-01-01: qty 250

## 2021-01-01 MED ORDER — DOXORUBICIN HCL CHEMO IV INJECTION 2 MG/ML
47.0000 mg/m2 | Freq: Once | INTRAVENOUS | Status: AC
Start: 2021-01-01 — End: 2021-01-01
  Administered 2021-01-01: 100 mg via INTRAVENOUS
  Filled 2021-01-01: qty 50

## 2021-01-01 MED ORDER — ACETAMINOPHEN 325 MG PO TABS
650.0000 mg | ORAL_TABLET | Freq: Once | ORAL | Status: AC
Start: 2021-01-01 — End: 2021-01-01
  Administered 2021-01-01: 650 mg via ORAL
  Filled 2021-01-01: qty 2

## 2021-01-01 MED ORDER — SODIUM CHLORIDE 0.9 % IV SOLN
750.0000 mg/m2 | Freq: Once | INTRAVENOUS | Status: AC
  Administered 2021-01-01: 1600 mg via INTRAVENOUS
  Filled 2021-01-01: qty 80

## 2021-01-01 MED ORDER — DIPHENHYDRAMINE HCL 25 MG PO CAPS
25.0000 mg | ORAL_CAPSULE | Freq: Once | ORAL | Status: AC
  Administered 2021-01-01: 25 mg via ORAL
  Filled 2021-01-01: qty 1

## 2021-01-01 MED ORDER — VINCRISTINE SULFATE CHEMO INJECTION 1 MG/ML
2.0000 mg | Freq: Once | INTRAVENOUS | Status: AC
  Administered 2021-01-01: 2 mg via INTRAVENOUS
  Filled 2021-01-01: qty 2

## 2021-01-01 MED ORDER — SODIUM CHLORIDE 0.9 % IV SOLN: 375.0000 mg/m2 | Freq: Once | INTRAVENOUS | Status: DC

## 2021-01-01 MED ORDER — SODIUM CHLORIDE 0.9 % IV SOLN
10.0000 mg | Freq: Once | INTRAVENOUS | Status: AC
  Administered 2021-01-01: 10 mg via INTRAVENOUS
  Filled 2021-01-01: qty 10

## 2021-01-01 MED ORDER — SODIUM CHLORIDE 0.9 % IV SOLN
150.0000 mg | Freq: Once | INTRAVENOUS | Status: AC
  Administered 2021-01-01: 150 mg via INTRAVENOUS
  Filled 2021-01-01: qty 150

## 2021-01-01 MED ORDER — RITUXIMAB-ABBS CHEMO 500 MG/50ML IV SOLN
375.0000 mg/m2 | Freq: Once | INTRAVENOUS | Status: AC
Start: 2021-01-01 — End: 2021-01-01
  Administered 2021-01-01: 800 mg via INTRAVENOUS
  Filled 2021-01-01: qty 50

## 2021-01-01 MED ORDER — HEPARIN SOD (PORK) LOCK FLUSH 100 UNIT/ML IV SOLN
500.0000 [IU] | Freq: Once | INTRAVENOUS | Status: AC | PRN
  Administered 2021-01-01: 500 [IU]
  Filled 2021-01-01: qty 5

## 2021-01-01 MED ORDER — HEPARIN SOD (PORK) LOCK FLUSH 100 UNIT/ML IV SOLN
INTRAVENOUS | Status: AC
  Filled 2021-01-01: qty 5

## 2021-01-01 MED ORDER — PALONOSETRON HCL INJECTION 0.25 MG/5ML
0.2500 mg | Freq: Once | INTRAVENOUS | Status: AC
  Administered 2021-01-01: 0.25 mg via INTRAVENOUS
  Filled 2021-01-01: qty 5

## 2021-01-01 NOTE — Progress Notes (Signed)
HR 120, Per Dr. Grayland Ormond okay to proceed with treatment at this time.

## 2021-01-03 ENCOUNTER — Ambulatory Visit (INDEPENDENT_AMBULATORY_CARE_PROVIDER_SITE_OTHER): Payer: 59 | Admitting: Primary Care

## 2021-01-03 ENCOUNTER — Other Ambulatory Visit: Payer: Self-pay

## 2021-01-03 ENCOUNTER — Inpatient Hospital Stay: Payer: 59

## 2021-01-03 ENCOUNTER — Encounter (INDEPENDENT_AMBULATORY_CARE_PROVIDER_SITE_OTHER): Payer: Self-pay | Admitting: Primary Care

## 2021-01-03 VITALS — BP 114/83 | HR 114 | Temp 98.1°F | Ht 66.0 in | Wt 216.2 lb

## 2021-01-03 DIAGNOSIS — F411 Generalized anxiety disorder: Secondary | ICD-10-CM

## 2021-01-03 DIAGNOSIS — R7303 Prediabetes: Secondary | ICD-10-CM | POA: Diagnosis not present

## 2021-01-03 DIAGNOSIS — Z131 Encounter for screening for diabetes mellitus: Secondary | ICD-10-CM

## 2021-01-03 DIAGNOSIS — Z6834 Body mass index (BMI) 34.0-34.9, adult: Secondary | ICD-10-CM

## 2021-01-03 DIAGNOSIS — Z5112 Encounter for antineoplastic immunotherapy: Secondary | ICD-10-CM | POA: Diagnosis not present

## 2021-01-03 DIAGNOSIS — Z7689 Persons encountering health services in other specified circumstances: Secondary | ICD-10-CM

## 2021-01-03 DIAGNOSIS — E6609 Other obesity due to excess calories: Secondary | ICD-10-CM

## 2021-01-03 DIAGNOSIS — C8520 Mediastinal (thymic) large B-cell lymphoma, unspecified site: Secondary | ICD-10-CM

## 2021-01-03 LAB — POCT GLYCOSYLATED HEMOGLOBIN (HGB A1C): Hemoglobin A1C: 5.9 % — AB (ref 4.0–5.6)

## 2021-01-03 MED ORDER — PEGFILGRASTIM-CBQV 6 MG/0.6ML ~~LOC~~ SOSY
6.0000 mg | PREFILLED_SYRINGE | Freq: Once | SUBCUTANEOUS | Status: AC
  Administered 2021-01-03: 6 mg via SUBCUTANEOUS
  Filled 2021-01-03: qty 0.6

## 2021-01-03 NOTE — Patient Instructions (Signed)
Prediabetes Eating Plan Prediabetes is a condition that causes blood sugar (glucose) levels to be higher than normal. This increases the risk for developing type 2 diabetes (type 2 diabetes mellitus). Working with a health care provider or nutrition specialist (dietitian) to make diet and lifestyle changes can help prevent the onset of diabetes. These changes may help you:  Control your blood glucose levels.  Improve your cholesterol levels.  Manage your blood pressure. What are tips for following this plan? Reading food labels  Read food labels to check the amount of fat, salt (sodium), and sugar in prepackaged foods. Avoid foods that have: ? Saturated fats. ? Trans fats. ? Added sugars.  Avoid foods that have more than 300 milligrams (mg) of sodium per serving. Limit your sodium intake to less than 2,300 mg each day. Shopping  Avoid buying pre-made and processed foods.  Avoid buying drinks with added sugar. Cooking  Cook with olive oil. Do not use butter, lard, or ghee.  Bake, broil, grill, steam, or boil foods. Avoid frying. Meal planning  Work with your dietitian to create an eating plan that is right for you. This may include tracking how many calories you take in each day. Use a food diary, notebook, or mobile application to track what you eat at each meal.  Consider following a Mediterranean diet. This includes: ? Eating several servings of fresh fruits and vegetables each day. ? Eating fish at least twice a week. ? Eating one serving each day of whole grains, beans, nuts, and seeds. ? Using olive oil instead of other fats. ? Limiting alcohol. ? Limiting red meat. ? Using nonfat or low-fat dairy products.  Consider following a plant-based diet. This includes dietary choices that focus on eating mostly vegetables and fruit, grains, beans, nuts, and seeds.  If you have high blood pressure, you may need to limit your sodium intake or follow a diet such as the DASH  (Dietary Approaches to Stop Hypertension) eating plan. The DASH diet aims to lower high blood pressure.   Lifestyle  Set weight loss goals with help from your health care team. It is recommended that most people with prediabetes lose 7% of their body weight.  Exercise for at least 30 minutes 5 or more days a week.  Attend a support group or seek support from a mental health counselor.  Take over-the-counter and prescription medicines only as told by your health care provider. What foods are recommended? Fruits Berries. Bananas. Apples. Oranges. Grapes. Papaya. Mango. Pomegranate. Kiwi. Grapefruit. Cherries. Vegetables Lettuce. Spinach. Peas. Beets. Cauliflower. Cabbage. Broccoli. Carrots. Tomatoes. Squash. Eggplant. Herbs. Peppers. Onions. Cucumbers. Brussels sprouts. Grains Whole grains, such as whole-wheat or whole-grain breads, crackers, cereals, and pasta. Unsweetened oatmeal. Bulgur. Barley. Quinoa. Brown rice. Corn or whole-wheat flour tortillas or taco shells. Meats and other proteins Seafood. Poultry without skin. Lean cuts of pork and beef. Tofu. Eggs. Nuts. Beans. Dairy Low-fat or fat-free dairy products, such as yogurt, cottage cheese, and cheese. Beverages Water. Tea. Coffee. Sugar-free or diet soda. Seltzer water. Low-fat or nonfat milk. Milk alternatives, such as soy or almond milk. Fats and oils Olive oil. Canola oil. Sunflower oil. Grapeseed oil. Avocado. Walnuts. Sweets and desserts Sugar-free or low-fat pudding. Sugar-free or low-fat ice cream and other frozen treats. Seasonings and condiments Herbs. Sodium-free spices. Mustard. Relish. Low-salt, low-sugar ketchup. Low-salt, low-sugar barbecue sauce. Low-fat or fat-free mayonnaise. The items listed above may not be a complete list of recommended foods and beverages. Contact a dietitian for more  information. What foods are not recommended? Fruits Fruits canned with syrup. Vegetables Canned vegetables. Frozen  vegetables with butter or cream sauce. Grains Refined white flour and flour products, such as bread, pasta, snack foods, and cereals. Meats and other proteins Fatty cuts of meat. Poultry with skin. Breaded or fried meat. Processed meats. Dairy Full-fat yogurt, cheese, or milk. Beverages Sweetened drinks, such as iced tea and soda. Fats and oils Butter. Lard. Ghee. Sweets and desserts Baked goods, such as cake, cupcakes, pastries, cookies, and cheesecake. Seasonings and condiments Spice mixes with added salt. Ketchup. Barbecue sauce. Mayonnaise. The items listed above may not be a complete list of foods and beverages that are not recommended. Contact a dietitian for more information. Where to find more information  American Diabetes Association: www.diabetes.org Summary  You may need to make diet and lifestyle changes to help prevent the onset of diabetes. These changes can help you control blood sugar, improve cholesterol levels, and manage blood pressure.  Set weight loss goals with help from your health care team. It is recommended that most people with prediabetes lose 7% of their body weight.  Consider following a Mediterranean diet. This includes eating plenty of fresh fruits and vegetables, whole grains, beans, nuts, seeds, fish, and low-fat dairy, and using olive oil instead of other fats. This information is not intended to replace advice given to you by your health care provider. Make sure you discuss any questions you have with your health care provider. Document Revised: 11/30/2019 Document Reviewed: 11/30/2019 Elsevier Patient Education  [place].

## 2021-01-03 NOTE — Progress Notes (Signed)
New Patient Office Visit  Subjective:  Patient ID: Niemah Schwebke, female    DOB: 1966/09/13  Age: 55 y.o. MRN: [id number]  CC:  Chief Complaint  Patient presents with  . New Patient (Initial Visit)    HPI Ms. Harue Pribble is a 55 year old female who  presents for establishment of care.  She was dx on 10/23/20  is followed by oncology for multiple medical problems including stage II bulky primary mediastinal B-cell lymphoma and malignant pleural effusion. She has had depression and anxiety since her cancer diagnosis.  Past Medical History:  Diagnosis Date  . Cancer [facility])    cervical    Past Surgical History:  Procedure Laterality Date  . IR PERC PLEURAL DRAIN W/INDWELL CATH W/IMG GUIDE  11/11/2020  . IR REMOVAL OF PLURAL CATH W/CUFF  12/13/2020  . PORTA CATH INSERTION N/A 11/18/2020   Procedure: PORTA CATH INSERTION;  Surgeon: Algernon Huxley, MD;  Location: [facility];  Service: Cardiovascular;  Laterality: N/A;    History reviewed. No pertinent family history.  Social History   Socioeconomic History  . Marital status: Legally Separated    Spouse name: Not on file  . Number of children: Not on file  . Years of education: Not on file  . Highest education level: Not on file  Occupational History  . Not on file  Tobacco Use  . Smoking status: Never Smoker  . Smokeless tobacco: Never Used  Substance and Sexual Activity  . Alcohol use: Never  . Drug use: Not on file  . Sexual activity: Not on file  Other Topics Concern  . Not on file  Social History Narrative  . Not on file   Social Determinants of Health   Financial Resource Strain: Not on file  Food Insecurity: Not on file  Transportation Needs: Not on file  Physical Activity: Not on file  Stress: Not on file  Social Connections: Not on file  Intimate Partner Violence: Not on file    ROS Review of Systems  Gastrointestinal: Positive for nausea and vomiting.       Cancer tx  Genitourinary:  Positive for frequency.  Neurological: Positive for headaches.       Dx migraines   Psychiatric/Behavioral:       Depression     Objective:   Today's Vitals: BP 114/83 (BP Location: Right Arm, Patient Position: Sitting, Cuff Size: Large)   Pulse (!) 114   Temp 98.1 F (36.7 C) (Temporal)   Ht 5\' 6"  (1.676 m)   Wt 216 lb 3.2 oz (98.1 kg)   SpO2 96%   BMI 34.90 kg/m   Physical Exam Vitals reviewed.  Constitutional:      Appearance: Normal appearance. She is obese.  HENT:     Right Ear: Tympanic membrane and external ear normal.     Left Ear: Tympanic membrane and external ear normal.     Nose: Nose normal.  Eyes:     Extraocular Movements: Extraocular movements intact.     Pupils: Pupils are equal, round, and reactive to light.  Cardiovascular:     Rate and Rhythm: Normal rate and regular rhythm.  Pulmonary:     Effort: Pulmonary effort is normal.     Breath sounds: Normal breath sounds.  Abdominal:     General: Bowel sounds are normal. There is distension.     Palpations: Abdomen is soft.  Musculoskeletal:        General: Normal range of motion.  Cervical back: Normal range of motion and neck supple.  Skin:    General: Skin is warm and dry.  Neurological:     Mental Status: She is alert and oriented to person, place, and time.  Psychiatric:        Mood and Affect: Mood normal.        Behavior: Behavior normal.        Thought Content: Thought content normal.        Judgment: Judgment normal.     Assessment & Plan:   Problem List Items Addressed This Visit   None   Visit Diagnoses    Screening for diabetes mellitus    -  Primary   Relevant Orders   HgB A1c (Completed)   Encounter to establish care       Prediabetes       Class 1 obesity due to excess calories without serious comorbidity with body mass index (BMI) of 34.0 to 34.9 in adult          Outpatient Encounter Medications as of 01/03/2021  Medication Sig  . allopurinol (ZYLOPRIM) 300 MG  tablet Take 1 tablet (300 mg total) by mouth daily.  Marland Kitchen amoxicillin-clavulanate (AUGMENTIN) 875-125 MG tablet Take 1 tablet by mouth 2 (two) times daily. (Patient not taking: Reported on 01/06/2021)  . apixaban (ELIQUIS) 5 MG TABS tablet Take 1 tablet (5 mg total) by mouth 2 (two) times daily.  . diphenoxylate-atropine (LOMOTIL) 2.5-0.025 MG tablet Take 1 tablet by mouth 4 (four) times daily as needed for diarrhea or loose stools. (Patient not taking: Reported on 01/06/2021)  . HYDROcodone-acetaminophen (NORCO/VICODIN) 5-325 MG tablet Take 1 tablet by mouth every 6 (six) hours as needed for moderate pain. (Patient not taking: Reported on 01/06/2021)  . mirtazapine (REMERON) 15 MG tablet Take 1 tablet (15 mg total) by mouth at bedtime.  . ondansetron (ZOFRAN) 8 MG tablet Take 1 tablet (8 mg total) by mouth 2 (two) times daily as needed for refractory nausea / vomiting. (Patient not taking: Reported on 01/06/2021)  . predniSONE (DELTASONE) 20 MG tablet Take 5 tablets (100 mg total) by mouth daily. Take with food on days 1-5 of chemotherapy.  . prochlorperazine (COMPAZINE) 10 MG tablet Take 1 tablet (10 mg total) by mouth every 6 (six) hours as needed (Nausea or vomiting). (Patient not taking: Reported on 01/06/2021)  . folic acid (FOLVITE) 1 MG tablet Take 1 mg by mouth daily.  Marland Kitchen lidocaine-prilocaine (EMLA) cream Apply to affected area once (Patient not taking: Reported on 01/06/2021)  . magic mouthwash SOLN Take 5 mLs by mouth 4 (four) times daily. (Patient not taking: Reported on 01/06/2021)  . [DISCONTINUED] acetaminophen (TYLENOL) 500 MG tablet Take 1 tablet (500 mg total) by mouth every 6 (six) hours as needed.  . [DISCONTINUED] ibuprofen (ADVIL) 600 MG tablet Take 1 tablet (600 mg total) by mouth every 6 (six) hours as needed.   No facility-administered encounter medications on file as of 01/03/2021.  Pleasant was seen today for new patient (initial visit).  Diagnoses and all orders for this  visit:  Screening for diabetes mellitus -     HgB A1c 5.9  Encounter to establish care Establish care   Prediabetes New dx prediabetes- discussed monitoring foods that are high in carbohydrates are the following rice, potatoes, breads, sugars, and pastas.  Reduction in the intake (eating) will assist in lowering your blood sugars.  Class 1 obesity due to excess calories without serious comorbidity with body mass index (BMI)  of 34.0 to 34.9 in adult Obesity is 30-39 indicating an excess in caloric intake or underlining conditions. This may lead to other co-morbidities. Lifestyle modifications of diet and exercise may reduce obesity. ( increase RISK for  HTN, T2D and respiratory risk (CVD)  GAD (generalized anxiety disorder) [place] Office Visit from 01/03/2021 in [place]  PHQ-9 Total Score 9     Therapy would more beneficial to have some one to talk with refer to behavioral health  Follow-up: Return in about 1 year (around 01/03/2022).   Kerin Perna, NP

## 2021-01-06 ENCOUNTER — Inpatient Hospital Stay (HOSPITAL_BASED_OUTPATIENT_CLINIC_OR_DEPARTMENT_OTHER): Payer: 59 | Admitting: Hospice and Palliative Medicine

## 2021-01-06 ENCOUNTER — Encounter (INDEPENDENT_AMBULATORY_CARE_PROVIDER_SITE_OTHER): Payer: Self-pay | Admitting: Primary Care

## 2021-01-06 ENCOUNTER — Telehealth: Payer: Self-pay | Admitting: *Deleted

## 2021-01-06 DIAGNOSIS — F32A Depression, unspecified: Secondary | ICD-10-CM | POA: Diagnosis not present

## 2021-01-06 DIAGNOSIS — C8520 Mediastinal (thymic) large B-cell lymphoma, unspecified site: Secondary | ICD-10-CM | POA: Diagnosis not present

## 2021-01-06 DIAGNOSIS — Z515 Encounter for palliative care: Secondary | ICD-10-CM

## 2021-01-06 NOTE — Progress Notes (Signed)
Virtual Visit via Telephone Note  I connected with Jennifer Cole on 01/06/21 at 11:15 AM EDT by telephone and verified that I am speaking with the correct person using two identifiers.  Location: Patient: home Provider: clinic   I discussed the limitations, risks, security and privacy concerns of performing an evaluation and management service by telephone and the availability of in person appointments. I also discussed with the patient that there may be a patient responsible charge related to this service. The patient expressed understanding and agreed to proceed.   History of Present Illness: Jennifer Cole is a 55 y.o. 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  Patient has had depression and anxiety since her cancer diagnosis.  She also has unresolved grief following the loss of her daughter in 2021.  Patient was referred to palliative care to help address goals and manage ongoing symptoms.   Observations/Objective: I called and spoke with patient by phone.  She reports since starting the mirtazapine, she has had more good days than bad in regards to her anxiety and depression.  She feels overall her moods are improved.  She says her sleep is improved on it.  She denies any adverse effects.  Patient seems to be tolerating mirtazapine well.  She denies any other symptomatic complaints at present.  Patient seems to be tolerating chemotherapy well.  She is pending psych eval in June.  Assessment and Plan: Lymphoma -tolerating treatments well.  Followed by Dr. Grayland Ormond.  Depression -continue mirtazapine 15 mg nightly.  Patient pending psych eval in June.  Follow Up Instructions: RTC in 2 weeks   I discussed the assessment and treatment plan with the patient. The patient was provided an opportunity to ask questions and all were answered. The  patient agreed with the plan and demonstrated an understanding of the instructions.   The patient was advised to call back or seek an in-person evaluation if the symptoms worsen or if the condition fails to improve as anticipated.  I provided 10 minutes of non-face-to-face time during this encounter.   Irean Hong, NP

## 2021-01-06 NOTE — Progress Notes (Signed)
F/u palliative care. No new concerns. Reports that she has "good days and bad days" in reference to depression, but no thoughts of harming herself. States she fell Saturday and sustained a bruise to right leg, but no pain or trouble ambulating.

## 2021-01-06 NOTE — Telephone Encounter (Signed)
Pt left message to inform Dr. Grayland Ormond that she had a fall last Saturday from tripping while walking outside. Does not have any pain. Only landed on her elbow and right leg. Bruise has appeared below right knee but no other injuries.

## 2021-01-07 ENCOUNTER — Other Ambulatory Visit: Payer: Self-pay

## 2021-01-08 ENCOUNTER — Other Ambulatory Visit (HOSPITAL_COMMUNITY)
Admission: RE | Admit: 2021-01-08 | Discharge: 2021-01-08 | Disposition: A | Discharge status: Home / Self Care | Payer: 59 | Source: Ambulatory Visit | Attending: Certified Nurse Midwife | Admitting: Certified Nurse Midwife

## 2021-01-08 ENCOUNTER — Ambulatory Visit (INDEPENDENT_AMBULATORY_CARE_PROVIDER_SITE_OTHER): Payer: 59 | Admitting: Certified Nurse Midwife

## 2021-01-08 ENCOUNTER — Other Ambulatory Visit (HOSPITAL_COMMUNITY): Payer: Self-pay

## 2021-01-08 ENCOUNTER — Other Ambulatory Visit: Payer: Self-pay

## 2021-01-08 ENCOUNTER — Encounter: Payer: Self-pay | Admitting: Certified Nurse Midwife

## 2021-01-08 VITALS — BP 106/67 | HR 134 | Ht 66.0 in | Wt 209.2 lb

## 2021-01-08 DIAGNOSIS — N898 Other specified noninflammatory disorders of vagina: Secondary | ICD-10-CM | POA: Insufficient documentation

## 2021-01-08 DIAGNOSIS — R102 Pelvic and perineal pain: Secondary | ICD-10-CM | POA: Insufficient documentation

## 2021-01-08 NOTE — Progress Notes (Signed)
GYN ENCOUNTER NOTE  Subjective:       Jennifer Cole is a 55 y.o. No obstetric history on file. female is here for gynecologic evaluation of the following issues:  1. Vaginal pain and odor x 3 wks. State she is sexually active and has painful intercourse as well. Her history is significant for cancer she is currently getting chemotherapy treatment. .     Gynecologic History No LMP recorded. Patient is postmenopausal. Contraception: status post hysterectomy Last Pap: no cervix /hystorectomy Last mammogram: unknown.   Obstetric History OB History  Gravida Para Term Preterm AB Living  4 3 3   1 3   SAB IAB Ectopic Multiple Live Births          3    # Outcome Date GA Lbr Len/2nd Weight Sex Delivery Anes PTL Lv  4 Term 2000   8 lb 1.9 oz (3.683 kg) F CS-LTranv   LIV  3 Term 1999   8 lb 2.4 oz (3.697 kg) M Vag-Spont   LIV  2 AB 1995          1 Term 1983   5 lb 1.6 oz (2.313 kg) F Vag-Spont   LIV    Past Medical History:  Diagnosis Date  . Cancer (HCC)    cervical  . Depression   . Non Hodgkin's lymphoma [facility])     Past Surgical History:  Procedure Laterality Date  . ABDOMINAL HYSTERECTOMY    . CESAREAN SECTION    . CHOLECYSTECTOMY    . IR PERC PLEURAL DRAIN W/INDWELL CATH W/IMG GUIDE  11/11/2020  . IR REMOVAL OF PLURAL CATH W/CUFF  12/13/2020  . PORTA CATH INSERTION N/A 11/18/2020   Procedure: PORTA CATH INSERTION;  Surgeon: Algernon Huxley, MD;  Location: [place] [facility];  Service: Cardiovascular;  Laterality: N/A;    Current Outpatient Medications on File Prior to Visit  Medication Sig Dispense Refill  . allopurinol (ZYLOPRIM) 300 MG tablet Take 1 tablet (300 mg total) by mouth daily. 30 tablet 3  . apixaban (ELIQUIS) 5 MG TABS tablet Take 1 tablet (5 mg total) by mouth 2 (two) times daily. 60 tablet 2  . folic acid (FOLVITE) 1 MG tablet Take 1 mg by mouth daily.    . mirtazapine (REMERON) 15 MG tablet Take 1 tablet (15 mg total) by mouth at bedtime. 30 tablet 2  .  predniSONE (DELTASONE) 20 MG tablet Take 5 tablets (100 mg total) by mouth daily. Take with food on days 1-5 of chemotherapy. 25 tablet 4  . prochlorperazine (COMPAZINE) 10 MG tablet Take 1 tablet (10 mg total) by mouth every 6 (six) hours as needed (Nausea or vomiting). 60 tablet 2   No current facility-administered medications on file prior to visit.    No Known Allergies  Social History   Socioeconomic History  . Marital status: Legally Separated    Spouse name: Not on file  . Number of children: Not on file  . Years of education: Not on file  . Highest education level: Not on file  Occupational History  . Not on file  Tobacco Use  . Smoking status: Never Smoker  . Smokeless tobacco: Never Used  Substance and Sexual Activity  . Alcohol use: Never  . Drug use: Never  . Sexual activity: Yes    Birth control/protection: Surgical  Other Topics Concern  . Not on file  Social History Narrative  . Not on file   Social Determinants of Health   Financial  Resource Strain: Not on file  Food Insecurity: Not on file  Transportation Needs: Not on file  Physical Activity: Not on file  Stress: Not on file  Social Connections: Not on file  Intimate Partner Violence: Not on file    Family History  Problem Relation Age of Onset  . Breast cancer Neg Hx   . Ovarian cancer Neg Hx   . Colon cancer Neg Hx   . Diabetes Neg Hx     The following portions of the patient's history were reviewed and updated as appropriate: allergies, current medications, past family history, past medical history, past social history, past surgical history and problem list.  Review of Systems Review of Systems - Negative except as noted in HPI Review of Systems - General ROS: negative for - chills, fatigue, fever, hot flashes, malaise or night sweats Hematological and Lymphatic ROS: negative for - bleeding problems or swollen lymph nodes Gastrointestinal ROS: negative for - abdominal pain, blood in  stools, change in bowel habits and nausea/vomiting Musculoskeletal ROS: negative for - joint pain, muscle pain or muscular weakness Genito-Urinary ROS: negative for - change in menstrual cycle, dysmenorrhea, dyspareunia, dysuria, genital discharge, genital ulcers, hematuria, incontinence, irregular/heavy menses, nocturia or pelvic pain. Positive for vaginal pain with odor.  Objective:   BP 106/67   Pulse (!) 134   Ht 5\' 6"  (1.676 m)   Wt 209 lb 3.2 oz (94.9 kg)   BMI 33.77 kg/m  CONSTITUTIONAL: Well-developed, well-nourished female in no acute distress.  HENT:  Normocephalic, atraumatic.  NECK: Normal range of motion, supple, no masses.  Normal thyroid.  SKIN: Skin is warm and dry. No rash noted. Not diaphoretic. No erythema. No pallor. [place]igg[place]: Alert and oriented to person, place, and time. PSYCHIATRIC: Normal mood and affect. Normal behavior. Normal judgment and thought content. CARDIOVASCULAR:Not Examined RESPIRATORY: Not Examined BREASTS: Not Examined ABDOMEN: Soft, non distended; Non tender.  No Organomegaly. PELVIC:  External Genitalia: Normal  BUS: Normal  Vagina: redness and tenderness noted on exam , especially deeper in the  vagina. No odor noted  Cervix: absent  Uterus: absent  Adnexa: Normal  RV: Normal   Bladder: Nontender MUSCULOSKELETAL: Normal range of motion. No tenderness.  No cyanosis, clubbing, or edema.   Assessment:   Vaginal pain Vaginal odor   Plan:   Discussed compromised immune system and increased occurrence of BV and yeast. Discussed treatment options. Pt encouraged to use lubrication intercourse, vaginal moisturize , and boric acid suppositories as needed for itching, odor. Discussed change in vaginal tissue with menopause and treatment options given pt history cancer recommend pelvic floor physical therapy or treatment for vulvodynia with neuropathic pain agent ( gabapentin) or SNRI (dulixetine). She verbalizes and agrees to plan. Swab  collected for BV and yeast , will follow up with results. Return prn.   Philip Aspen, CNM

## 2021-01-08 NOTE — Patient Instructions (Signed)

## 2021-01-09 LAB — CERVICOVAGINAL ANCILLARY ONLY
Bacterial Vaginitis (gardnerella): NEGATIVE
Candida Glabrata: NEGATIVE
Candida Vaginitis: NEGATIVE
Comment: NEGATIVE
Comment: NEGATIVE
Comment: NEGATIVE

## 2021-01-13 ENCOUNTER — Other Ambulatory Visit: Payer: Self-pay | Admitting: *Deleted

## 2021-01-13 ENCOUNTER — Other Ambulatory Visit: Payer: Self-pay | Admitting: Certified Nurse Midwife

## 2021-01-13 MED ORDER — GABAPENTIN 100 MG PO CAPS: 100.0000 mg | ORAL_CAPSULE | Freq: Three times a day (TID) | ORAL | 0 refills | Status: DC

## 2021-01-14 MED ORDER — HYDROCODONE-ACETAMINOPHEN 5-325 MG PO TABS: 1.0000 | ORAL_TABLET | Freq: Four times a day (QID) | ORAL | 0 refills | Status: AC | PRN

## 2021-01-17 NOTE — Progress Notes (Signed)
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  She continues to have intermittent flank pain at the site of her previous Pleurx catheter, but otherwise feels well.  She is tolerating her treatments without significant side effects. She has no neurologic complaints.  She denies any recent fevers or illnesses.  She has a fair appetite, but denies weight loss.  She has no chest pain, shortness of breath, cough or hemoptysis.  She denies any nausea, vomiting, constipation, or diarrhea.  She has no urinary complaints.  Patient offers no further specific complaints today.  REVIEW OF SYSTEMS:   Review of Systems  Constitutional: Negative.  Negative for fever, malaise/fatigue and weight loss.  Respiratory: Negative for cough, hemoptysis and shortness of breath.   Cardiovascular: Negative.  Negative for chest pain and leg swelling.  Gastrointestinal: Negative.  Negative for abdominal pain and nausea.  Genitourinary: Positive for flank pain. Negative for dysuria.  Musculoskeletal: Negative for back pain.  Skin: Negative.   Neurological: Negative.  Negative for dizziness, focal weakness, weakness and headaches.  Psychiatric/Behavioral: Negative.  The patient is not nervous/anxious.     As per HPI. Otherwise, a complete review of systems is negative.  PAST MEDICAL HISTORY: Past Medical History:  Diagnosis Date  . Cancer (HCC)    cervical  . Depression   . Non Hodgkin's lymphoma ([place])     PAST SURGICAL HISTORY: Past Surgical History:  Procedure Laterality Date  .  ABDOMINAL HYSTERECTOMY    . CESAREAN SECTION    . CHOLECYSTECTOMY    . IR PERC PLEURAL DRAIN W/INDWELL CATH W/IMG GUIDE  11/11/2020  . IR REMOVAL OF PLURAL CATH W/CUFF  12/13/2020  . PORTA CATH INSERTION N/A 11/18/2020   Procedure: PORTA CATH INSERTION;  Surgeon: Algernon Huxley, MD;  Location: [place] [facility];  Service: Cardiovascular;  Laterality: N/A;    FAMILY HISTORY: Family History  Problem Relation Age of Onset  . Breast cancer Neg Hx   . Ovarian cancer Neg Hx   . Colon cancer Neg Hx   . Diabetes Neg Hx     ADVANCED DIRECTIVES (Y/N):  N  HEALTH MAINTENANCE: Social History   Tobacco Use  . Smoking status: Never Smoker  . Smokeless tobacco: Never Used  Substance Use Topics  . Alcohol use: Never  . Drug use: Never     Colonoscopy:  PAP:  Bone density:  Lipid panel:  No Known Allergies  Current Outpatient Medications  Medication Sig Dispense Refill  . allopurinol (ZYLOPRIM) 300 MG tablet Take 1 tablet (300 mg total) by mouth daily. 30 tablet 3  . apixaban (ELIQUIS) 5 MG TABS tablet Take 1 tablet (5 mg total) by mouth 2 (two) times daily. 60 tablet 2  . folic acid (FOLVITE) 1 MG tablet Take 1 mg by mouth daily.    Marland Kitchen gabapentin (NEURONTIN) 100 MG capsule Take 1 capsule (100 mg total) by mouth 3 (three) times daily. 30 capsule 0  . mirtazapine (REMERON) 15 MG tablet Take 1 tablet (15 mg total) by mouth at bedtime. 30 tablet 2  . predniSONE (DELTASONE) 20 MG tablet Take 5 tablets (100  mg total) by mouth daily. Take with food on days 1-5 of chemotherapy. 25 tablet 4  . prochlorperazine (COMPAZINE) 10 MG tablet Take 1 tablet (10 mg total) by mouth every 6 (six) hours as needed (Nausea or vomiting). 60 tablet 2   No current facility-administered medications for this visit.   Facility-Administered Medications Ordered in Other Visits  Medication Dose Route Frequency Provider Last Rate Last Admin  . cyclophosphamide (CYTOXAN) 1,600 mg in sodium chloride 0.9 % 250 mL chemo  infusion  750 mg/m2 (Treatment Plan Recorded) Intravenous Once Lloyd Huger, MD      . DOXOrubicin (ADRIAMYCIN) chemo injection 100 mg  47 mg/m2 (Treatment Plan Recorded) Intravenous Once Lloyd Huger, MD      . fosaprepitant (EMEND) 150 mg in sodium chloride 0.9 % 145 mL IVPB  150 mg Intravenous Once Lloyd Huger, MD 450 mL/hr at 01/22/21 1033 150 mg at 01/22/21 1033  . heparin lock flush 100 unit/mL  500 Units Intravenous Once Lloyd Huger, MD      . heparin lock flush 100 unit/mL  500 Units Intracatheter Once PRN Lloyd Huger, MD      . riTUXimab-abbs (TRUXIMA) 800 mg in sodium chloride 0.9 % 170 mL infusion  375 mg/m2 (Treatment Plan Recorded) Intravenous Once Lloyd Huger, MD      . sodium chloride flush (NS) 0.9 % injection 10 mL  10 mL Intravenous PRN Lloyd Huger, MD   10 mL at 01/22/21 0818  . vinCRIStine (ONCOVIN) 2 mg in sodium chloride 0.9 % 50 mL chemo infusion  2 mg Intravenous Once Lloyd Huger, MD        OBJECTIVE: Vitals:   01/22/21 0826  BP: 106/75  Pulse: (!) 108  Resp: 16  Temp: (!) 97.5 F (36.4 C)     Body mass index is 34.99 kg/m.    ECOG FS:0 - Asymptomatic  General: Well-developed, well-nourished, no acute distress. Eyes: Pink conjunctiva, anicteric sclera. HEENT: Normocephalic, moist mucous membranes. Lungs: No audible wheezing or coughing. Heart: Regular rate and rhythm. Abdomen: Soft, nontender, no obvious distention. Musculoskeletal: No edema, cyanosis, or clubbing. Neuro: Alert, answering all questions appropriately. Cranial nerves grossly intact. Skin: No rashes or petechiae noted. Psych: Normal affect.  LAB RESULTS:  Lab Results  Component Value Date   NA 139 01/22/2021   K 4.1 01/22/2021   CL 103 01/22/2021   CO2 27 01/22/2021   GLUCOSE 96 01/22/2021   BUN 17 01/22/2021   CREATININE 0.52 01/22/2021   CALCIUM 9.2 01/22/2021   PROT 6.6 01/22/2021   ALBUMIN 4.0 01/22/2021   AST 35  01/22/2021   ALT 31 01/22/2021   ALKPHOS 83 01/22/2021   BILITOT 0.6 01/22/2021   GFRNONAA >60 01/22/2021    Lab Results  Component Value Date   WBC 4.3 01/22/2021   NEUTROABS 2.6 01/22/2021   HGB 11.3 (L) 01/22/2021   HCT 33.9 (L) 01/22/2021   MCV 91.4 01/22/2021   PLT 276 01/22/2021     STUDIES: No results found.  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   PLAN:    1.  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  Bone marrow biopsy is negative for lymphoma.  Cardiac echo completed on November 19, 2020 revealed an EF of 55 to 60%.  Repeat in June 2022.  Proceed with cycle 4 of Truxima plus CHOP  today.  Return to clinic in 2 days for [facility] and then in 3 weeks for further evaluation and consideration of cycle 5.  We will do PET scan prior to next treatment.  Plan to do a total of 6 cycles followed by consolidation XRT.   2.  Shortness of breath/cough: Resolved.  Pleurx catheter has been removed. 3.  Pain: Intermittent, monitor. 4.  Headache: Resolved.  MRI of the brain on October 30, 2020 did not report any significant pathology. 5.  Right arm DVT: Continue Eliquis as prescribed at least through the duration of her treatments.  Okay to use port.  6.  Leukopenia: Resolved.  Udenyca as above. 7.  Transaminitis: Resolved. 7.  Hypotension: Resolved.  Patient expressed understanding and was in agreement with this plan. She also understands that She can call clinic at any time with any questions, concerns, or complaints.   Cancer Staging Mediastinal (thymic) large B-cell lymphoma ([place]) Staging form: Hodgkin and Non-Hodgkin Lymphoma, AJCC 8th Edition - Clinical stage from 11/13/2020: Stage II bulky (Diffuse large B-cell lymphoma) - Signed by Lloyd Huger, MD on 11/13/2020   Lloyd Huger, MD   01/22/2021 10:34  AM

## 2021-01-22 ENCOUNTER — Encounter: Payer: Self-pay | Admitting: *Deleted

## 2021-01-22 ENCOUNTER — Encounter: Payer: Self-pay | Admitting: Oncology

## 2021-01-22 ENCOUNTER — Inpatient Hospital Stay: Payer: 59 | Admitting: Hospice and Palliative Medicine

## 2021-01-22 ENCOUNTER — Inpatient Hospital Stay: Payer: 59

## 2021-01-22 ENCOUNTER — Inpatient Hospital Stay (HOSPITAL_BASED_OUTPATIENT_CLINIC_OR_DEPARTMENT_OTHER): Payer: 59 | Admitting: Oncology

## 2021-01-22 ENCOUNTER — Other Ambulatory Visit: Payer: Self-pay

## 2021-01-22 ENCOUNTER — Inpatient Hospital Stay: Payer: 59 | Attending: Oncology

## 2021-01-22 VITALS — BP 107/68 | HR 102 | Temp 96.0°F | Resp 17

## 2021-01-22 VITALS — BP 106/75 | HR 108 | Temp 97.5°F | Resp 16 | Wt 216.8 lb

## 2021-01-22 DIAGNOSIS — Z5112 Encounter for antineoplastic immunotherapy: Secondary | ICD-10-CM | POA: Diagnosis not present

## 2021-01-22 DIAGNOSIS — Z5189 Encounter for other specified aftercare: Secondary | ICD-10-CM | POA: Diagnosis not present

## 2021-01-22 DIAGNOSIS — C8520 Mediastinal (thymic) large B-cell lymphoma, unspecified site: Secondary | ICD-10-CM

## 2021-01-22 DIAGNOSIS — R42 Dizziness and giddiness: Secondary | ICD-10-CM | POA: Diagnosis not present

## 2021-01-22 DIAGNOSIS — Z5111 Encounter for antineoplastic chemotherapy: Secondary | ICD-10-CM | POA: Insufficient documentation

## 2021-01-22 LAB — COMPREHENSIVE METABOLIC PANEL
ALT: 31 U/L (ref 0–44)
AST: 35 U/L (ref 15–41)
Albumin: 4 g/dL (ref 3.5–5.0)
Alkaline Phosphatase: 83 U/L (ref 38–126)
Anion gap: 9 (ref 5–15)
BUN: 17 mg/dL (ref 6–20)
CO2: 27 mmol/L (ref 22–32)
Calcium: 9.2 mg/dL (ref 8.9–10.3)
Chloride: 103 mmol/L (ref 98–111)
Creatinine, Ser: 0.52 mg/dL (ref 0.44–1.00)
GFR, Estimated: >60 mL/min (ref >60)
Glucose, Bld: 96 mg/dL (ref 70–99)
Potassium: 4.1 mmol/L (ref 3.5–5.1)
Sodium: 139 mmol/L (ref 135–145)
Total Bilirubin: 0.6 mg/dL (ref 0.3–1.2)
Total Protein: 6.6 g/dL (ref 6.5–8.1)

## 2021-01-22 LAB — CBC WITH DIFFERENTIAL/PLATELET
Abs Immature Granulocytes: 0.04 10*3/uL (ref 0.00–0.07)
Basophils Absolute: 0.1 10*3/uL (ref 0.0–0.1)
Basophils Relative: 3 %
Eosinophils Absolute: 0.1 10*3/uL (ref 0.0–0.5)
Eosinophils Relative: 2 %
HCT: 33.9 % — ABNORMAL LOW (ref 36.0–46.0)
Hemoglobin: 11.3 g/dL — ABNORMAL LOW (ref 12.0–15.0)
Immature Granulocytes: 1 %
Lymphocytes Relative: 14 %
Lymphs Abs: 0.6 10*3/uL — ABNORMAL LOW (ref 0.7–4.0)
MCH: 30.5 pg (ref 26.0–34.0)
MCHC: 33.3 g/dL (ref 30.0–36.0)
MCV: 91.4 fL (ref 80.0–100.0)
Monocytes Absolute: 0.8 10*3/uL (ref 0.1–1.0)
Monocytes Relative: 19 %
Neutro Abs: 2.6 10*3/uL (ref 1.7–7.7)
Neutrophils Relative %: 61 %
Platelets: 276 10*3/uL (ref 150–400)
RBC: 3.71 MIL/uL — ABNORMAL LOW (ref 3.87–5.11)
RDW: 19.1 % — ABNORMAL HIGH (ref 11.5–15.5)
WBC: 4.3 10*3/uL (ref 4.0–10.5)
nRBC: 0 % (ref 0.0–0.2)

## 2021-01-22 MED ORDER — HEPARIN SOD (PORK) LOCK FLUSH 100 UNIT/ML IV SOLN
500.0000 [IU] | Freq: Once | INTRAVENOUS | Status: AC
  Filled 2021-01-22: qty 5

## 2021-01-22 MED ORDER — SODIUM CHLORIDE 0.9 % IV SOLN
Freq: Once | INTRAVENOUS | Status: AC
  Filled 2021-01-22: qty 250

## 2021-01-22 MED ORDER — VINCRISTINE SULFATE CHEMO INJECTION 1 MG/ML
2.0000 mg | Freq: Once | INTRAVENOUS | Status: AC
  Administered 2021-01-22: 2 mg via INTRAVENOUS
  Filled 2021-01-22: qty 2

## 2021-01-22 MED ORDER — ACETAMINOPHEN 325 MG PO TABS
650.0000 mg | ORAL_TABLET | Freq: Once | ORAL | Status: AC
  Administered 2021-01-22: 650 mg via ORAL
  Filled 2021-01-22: qty 2

## 2021-01-22 MED ORDER — HEPARIN SOD (PORK) LOCK FLUSH 100 UNIT/ML IV SOLN
500.0000 [IU] | Freq: Once | INTRAVENOUS | Status: AC | PRN
  Administered 2021-01-22: 500 [IU]
  Filled 2021-01-22: qty 5

## 2021-01-22 MED ORDER — PALONOSETRON HCL INJECTION 0.25 MG/5ML
0.2500 mg | Freq: Once | INTRAVENOUS | Status: AC
  Administered 2021-01-22: 0.25 mg via INTRAVENOUS
  Filled 2021-01-22: qty 5

## 2021-01-22 MED ORDER — SODIUM CHLORIDE 0.9 % IV SOLN: 375.0000 mg/m2 | Freq: Once | INTRAVENOUS | Status: DC

## 2021-01-22 MED ORDER — DIPHENHYDRAMINE HCL 25 MG PO CAPS
25.0000 mg | ORAL_CAPSULE | Freq: Once | ORAL | Status: AC
  Administered 2021-01-22: 25 mg via ORAL
  Filled 2021-01-22: qty 1

## 2021-01-22 MED ORDER — SODIUM CHLORIDE 0.9 % IV SOLN
150.0000 mg | Freq: Once | INTRAVENOUS | Status: AC
  Administered 2021-01-22: 150 mg via INTRAVENOUS
  Filled 2021-01-22: qty 150

## 2021-01-22 MED ORDER — SODIUM CHLORIDE 0.9% FLUSH
10.0000 mL | INTRAVENOUS | Status: DC | PRN
  Administered 2021-01-22: 10 mL via INTRAVENOUS
  Filled 2021-01-22: qty 10

## 2021-01-22 MED ORDER — HEPARIN SOD (PORK) LOCK FLUSH 100 UNIT/ML IV SOLN
INTRAVENOUS | Status: AC
  Filled 2021-01-22: qty 5

## 2021-01-22 MED ORDER — SODIUM CHLORIDE 0.9 % IV SOLN
750.0000 mg/m2 | Freq: Once | INTRAVENOUS | Status: AC
  Administered 2021-01-22: 1600 mg via INTRAVENOUS
  Filled 2021-01-22: qty 80

## 2021-01-22 MED ORDER — SODIUM CHLORIDE 0.9 % IV SOLN
10.0000 mg | Freq: Once | INTRAVENOUS | Status: AC
  Administered 2021-01-22: 10 mg via INTRAVENOUS
  Filled 2021-01-22: qty 10

## 2021-01-22 MED ORDER — DOXORUBICIN HCL CHEMO IV INJECTION 2 MG/ML
47.0000 mg/m2 | Freq: Once | INTRAVENOUS | Status: AC
  Administered 2021-01-22: 100 mg via INTRAVENOUS
  Filled 2021-01-22: qty 50

## 2021-01-22 MED ORDER — SODIUM CHLORIDE 0.9 % IV SOLN
375.0000 mg/m2 | Freq: Once | INTRAVENOUS | Status: AC
  Administered 2021-01-22: 800 mg via INTRAVENOUS
  Filled 2021-01-22: qty 50

## 2021-01-22 NOTE — Patient Instructions (Signed)
[facility]   Discharge Instructions: Thank you for choosing [facility] to provide your oncology and hematology care.  If you have a lab appointment with the [facility], please go directly to the [facility] and check in at the registration area.  Wear comfortable clothing and clothing appropriate for easy access to any Portacath or PICC line.   We strive to give you quality time with your provider. You may need to reschedule your appointment if you arrive late (15 or more minutes).  Arriving late affects you and other patients whose appointments are after yours.  Also, if you miss three or more appointments without notifying the office, you may be dismissed from the clinic at the provider's discretion.      For prescription refill requests, have your pharmacy contact our office and allow 72 hours for refills to be completed.    Today you received the following chemo  Rituximab Injection What is this medicine? RITUXIMAB (ri TUX i mab) is a monoclonal antibody. It is used to treat certain types of cancer like non-Hodgkin lymphoma and chronic lymphocytic leukemia. It is also used to treat rheumatoid arthritis, granulomatosis with polyangiitis, microscopic polyangiitis, and pemphigus vulgaris. This medicine may be used for other purposes; ask your health care provider or pharmacist if you have questions. COMMON BRAND NAME(S): RIABNI, Rituxan, RUXIENCE What should I tell my health care provider before I take this medicine? They need to know if you have any of these conditions:  chest pain  heart disease  infection especially a viral infection such as chickenpox, cold sores, hepatitis B, or herpes  immune system problems  irregular heartbeat or rhythm  kidney disease  low blood counts (white cells, platelets, or red cells)  lung disease  recent or upcoming vaccine  an unusual or allergic reaction to rituximab, other medicines,  foods, dyes, or preservatives  pregnant or trying to get pregnant  breast-feeding How should I use this medicine? This medicine is injected into a vein. It is given by a health care provider in a hospital or clinic setting. A special MedGuide will be given to you before each treatment. Be sure to read this information carefully each time. Talk to your health care provider about the use of this medicine in children. While this drug may be prescribed for children as young as 2 years for selected conditions, precautions do apply. Overdosage: If you think you have taken too much of this medicine contact a poison control center or emergency room at once. NOTE: This medicine is only for you. Do not share this medicine with others. What if I miss a dose? Keep appointments for follow-up doses. It is important not to miss your dose. Call your health care provider if you are unable to keep an appointment. What may interact with this medicine? Do not take this medicine with any of the following medicines:  live vaccines This medicine may also interact with the following medicines:  cisplatin This list may not describe all possible interactions. Give your health care provider a list of all the medicines, herbs, non-prescription drugs, or dietary supplements you use. Also tell them if you smoke, drink alcohol, or use illegal drugs. Some items may interact with your medicine. What should I watch for while using this medicine? Your condition will be monitored carefully while you are receiving this medicine. You may need blood work done while you are taking this medicine. This medicine can cause serious infusion reactions.  To reduce the risk your health care provider may give you other medicines to take before receiving this one. Be sure to follow the directions from your health care provider. This medicine may increase your risk of getting an infection. Call your health care provider for advice if you get a  fever, chills, sore throat, or other symptoms of a cold or flu. Do not treat yourself. Try to avoid being around people who are sick. Call your health care provider if you are around anyone with measles, chickenpox, or if you develop sores or blisters that do not heal properly. Avoid taking medicines that contain aspirin, acetaminophen, ibuprofen, naproxen, or ketoprofen unless instructed by your health care provider. These medicines may hide a fever. This medicine may cause serious skin reactions. They can happen weeks to months after starting the medicine. Contact your health care provider right away if you notice fevers or flu-like symptoms with a rash. The rash may be red or purple and then turn into blisters or peeling of the skin. Or, you might notice a red rash with swelling of the face, lips or lymph nodes in your neck or under your arms. In some patients, this medicine may cause a serious brain infection that may cause death. If you have any problems seeing, thinking, speaking, walking, or standing, tell your healthcare professional right away. If you cannot reach your healthcare professional, urgently seek other source of medical care. Do not become pregnant while taking this medicine or for at least 12 months after stopping it. Women should inform their health care provider if they wish to become pregnant or think they might be pregnant. There is potential for serious harm to an unborn child. Talk to your health care provider for more information. Women should use a reliable form of birth control while taking this medicine and for 12 months after stopping it. Do not breast-feed while taking this medicine or for at least 6 months after stopping it. What side effects may I notice from receiving this medicine? Side effects that you should report to your health care provider as soon as possible:  allergic reactions (skin rash, itching or hives; swelling of the face, lips, or  tongue)  diarrhea  edema (sudden weight gain; swelling of the ankles, feet, hands or other unusual swelling; trouble breathing)  fast, irregular heartbeat  heart attack (trouble breathing; pain or tightness in the chest, neck, back or arms; unusually weak or tired)  infection (fever, chills, cough, sore throat, pain or trouble passing urine)  kidney injury (trouble passing urine or change in the amount of urine)  liver injury (dark yellow or brown urine; general ill feeling or flu-like symptoms; loss of appetite, right upper belly pain; unusually weak or tired, yellowing of the eyes or skin)  low blood pressure (dizziness; feeling faint or lightheaded, falls; unusually weak or tired)  low red blood cell counts (trouble breathing; feeling faint; lightheaded, falls; unusually weak or tired)  mouth sores  redness, blistering, peeling, or loosening of the skin, including inside the mouth  stomach pain  unusual bruising or bleeding  wheezing (trouble breathing with loud or whistling sounds)  vomiting Side effects that usually do not require medical attention (report to your health care provider if they continue or are bothersome):  headache  joint pain  muscle cramps, pain  nausea This list may not describe all possible side effects. Call your doctor for medical advice about side effects. You may report side effects to FDA at 1-800-FDA-1088.  Where should I keep my medicine? This medicine is given in a hospital or clinic. It will not be stored at home. NOTE: This sheet is a summary. It may not cover all possible information. If you have questions about this medicine, talk to your doctor, pharmacist, or health care provider.  2021 Elsevier/Gold Standard (2020-06-13 21:35:50)  Cyclophosphamide Injection What is this medicine? CYCLOPHOSPHAMIDE (sye kloe FOSS fa mide) is a chemotherapy drug. It slows the growth of cancer cells. This medicine is used to treat many types of  cancer like lymphoma, myeloma, leukemia, breast cancer, and ovarian cancer, to name a few. This medicine may be used for other purposes; ask your health care provider or pharmacist if you have questions. COMMON BRAND NAME(S): Cytoxan, Neosar What should I tell my health care provider before I take this medicine? They need to know if you have any of these conditions:  heart disease  history of irregular heartbeat  infection  kidney disease  liver disease  low blood counts, like white cells, platelets, or red blood cells  on hemodialysis  recent or ongoing radiation therapy  scarring or thickening of the lungs  trouble passing urine  an unusual or allergic reaction to cyclophosphamide, other medicines, foods, dyes, or preservatives  pregnant or trying to get pregnant  breast-feeding How should I use this medicine? This drug is usually given as an injection into a vein or muscle or by infusion into a vein. It is administered in a hospital or clinic by a specially trained health care professional. Talk to your pediatrician regarding the use of this medicine in children. Special care may be needed. Overdosage: If you think you have taken too much of this medicine contact a poison control center or emergency room at once. NOTE: This medicine is only for you. Do not share this medicine with others. What if I miss a dose? It is important not to miss your dose. Call your doctor or health care professional if you are unable to keep an appointment. What may interact with this medicine?  amphotericin B  azathioprine  certain antivirals for HIV or hepatitis  certain medicines for blood pressure, heart disease, irregular heart beat  certain medicines that treat or prevent blood clots like warfarin  certain other medicines for cancer  cyclosporine  etanercept  indomethacin  medicines that relax muscles for surgery  medicines to increase blood counts  metronidazole This  list may not describe all possible interactions. Give your health care provider a list of all the medicines, herbs, non-prescription drugs, or dietary supplements you use. Also tell them if you smoke, drink alcohol, or use illegal drugs. Some items may interact with your medicine. What should I watch for while using this medicine? Your condition will be monitored carefully while you are receiving this medicine. You may need blood work done while you are taking this medicine. Drink water or other fluids as directed. Urinate often, even at night. Some products may contain alcohol. Ask your health care professional if this medicine contains alcohol. Be sure to tell all health care professionals you are taking this medicine. Certain medicines, like metronidazole and disulfiram, can cause an unpleasant reaction when taken with alcohol. The reaction includes flushing, headache, nausea, vomiting, sweating, and increased thirst. The reaction can last from 30 minutes to several hours. Do not become pregnant while taking this medicine or for 1 year after stopping it. Women should inform their health care professional if they wish to become pregnant or think they  might be pregnant. Men should not father a child while taking this medicine and for 4 months after stopping it. There is potential for serious side effects to an unborn child. Talk to your health care professional for more information. Do not breast-feed an infant while taking this medicine or for 1 week after stopping it. This medicine has caused ovarian failure in some women. This medicine may make it more difficult to get pregnant. Talk to your health care professional if you are concerned about your fertility. This medicine has caused decreased sperm counts in some men. This may make it more difficult to father a child. Talk to your health care professional if you are concerned about your fertility. Call your health care professional for advice if you  get a fever, chills, or sore throat, or other symptoms of a cold or flu. Do not treat yourself. This medicine decreases your body's ability to fight infections. Try to avoid being around people who are sick. Avoid taking medicines that contain aspirin, acetaminophen, ibuprofen, naproxen, or ketoprofen unless instructed by your health care professional. These medicines may hide a fever. Talk to your health care professional about your risk of cancer. You may be more at risk for certain types of cancer if you take this medicine. If you are going to need surgery or other procedure, tell your health care professional that you are using this medicine. Be careful brushing or flossing your teeth or using a toothpick because you may get an infection or bleed more easily. If you have any dental work done, tell your dentist you are receiving this medicine. What side effects may I notice from receiving this medicine? Side effects that you should report to your doctor or health care professional as soon as possible:  allergic reactions like skin rash, itching or hives, swelling of the face, lips, or tongue  breathing problems  nausea, vomiting  signs and symptoms of bleeding such as bloody or black, tarry stools; red or dark brown urine; spitting up blood or brown material that looks like coffee grounds; red spots on the skin; unusual bruising or bleeding from the eyes, gums, or nose  signs and symptoms of heart failure like fast, irregular heartbeat, sudden weight gain; swelling of the ankles, feet, hands  signs and symptoms of infection like fever; chills; cough; sore throat; pain or trouble passing urine  signs and symptoms of kidney injury like trouble passing urine or change in the amount of urine  signs and symptoms of liver injury like dark yellow or brown urine; general ill feeling or flu-like symptoms; light-colored stools; loss of appetite; nausea; right upper belly pain; unusually weak or tired;  yellowing of the eyes or skin Side effects that usually do not require medical attention (report to your doctor or health care professional if they continue or are bothersome):  confusion  decreased hearing  diarrhea  facial flushing  hair loss  headache  loss of appetite  missed menstrual periods  signs and symptoms of low red blood cells or anemia such as unusually weak or tired; feeling faint or lightheaded; falls  skin discoloration This list may not describe all possible side effects. Call your doctor for medical advice about side effects. You may report side effects to FDA at 1-800-FDA-1088. Where should I keep my medicine? This drug is given in a hospital or clinic and will not be stored at home. NOTE: This sheet is a summary. It may not cover all possible information. If you have  questions about this medicine, talk to your doctor, pharmacist, or health care provider.  2021 Elsevier/Gold Standard (2019-06-05 09:53:29)  Vincristine injection What is this medicine? VINCRISTINE (vin KRIS teen) is a chemotherapy drug. It slows the growth of cancer cells. This medicine is used to treat many types of cancer like Hodgkin's disease, leukemia, non-Hodgkin's lymphoma, neuroblastoma (brain cancer), rhabdomyosarcoma, and Wilms' tumor. This medicine may be used for other purposes; ask your health care provider or pharmacist if you have questions. COMMON BRAND NAME(S): Oncovin, Vincasar PFS What should I tell my health care provider before I take this medicine? They need to know if you have any of these conditions:  blood disorders  gout  infection (especially chickenpox, cold sores, or herpes)  kidney disease  liver disease  lung disease  nervous system disease like Charcot-Marie-Tooth (CMT)  recent or ongoing radiation therapy  an unusual or allergic reaction to vincristine, other chemotherapy agents, other medicines, foods, dyes, or preservatives  pregnant or trying  to get pregnant  breast-feeding How should I use this medicine? This drug is given as an infusion into a vein. It is administered in a hospital or clinic by a specially trained health care professional. If you have pain, swelling, burning, or any unusual feeling around the site of your injection, tell your health care professional right away. Talk to your pediatrician regarding the use of this medicine in children. While this drug may be prescribed for selected conditions, precautions do apply. Overdosage: If you think you have taken too much of this medicine contact a poison control center or emergency room at once. NOTE: This medicine is only for you. Do not share this medicine with others. What if I miss a dose? It is important not to miss your dose. Call your doctor or health care professional if you are unable to keep an appointment. What may interact with this medicine?  certain medicines for fungal infections like itraconazole, ketoconazole, posaconazole, voriconazole  certain medicines for seizures like phenytoin This list may not describe all possible interactions. Give your health care provider a list of all the medicines, herbs, non-prescription drugs, or dietary supplements you use. Also tell them if you smoke, drink alcohol, or use illegal drugs. Some items may interact with your medicine. What should I watch for while using this medicine? This drug may make you feel generally unwell. This is not uncommon, as chemotherapy can affect healthy cells as well as cancer cells. Report any side effects. Continue your course of treatment even though you feel ill unless your doctor tells you to stop. You may need blood work done while you are taking this medicine. This medicine will cause constipation. Try to have a bowel movement at least every 2 to 3 days. If you do not have a bowel movement for 3 days, call your doctor or health care professional. In some cases, you may be given additional  medicines to help with side effects. Follow all directions for their use. Do not become pregnant while taking this medicine. Women should inform their doctor if they wish to become pregnant or think they might be pregnant. There is a potential for serious side effects to an unborn child. Talk to your health care professional or pharmacist for more information. Do not breast-feed an infant while taking this medicine. This medicine may make it more difficult to get pregnant or to father a child. Talk to your healthcare professional if you are concerned about your fertility. What side effects may I notice  from receiving this medicine? Side effects that you should report to your doctor or health care professional as soon as possible:  allergic reactions like skin rash, itching or hives, swelling of the face, lips, or tongue  breathing problems  confusion or changes in emotions or moods  constipation  cough  mouth sores  muscle weakness  nausea and vomiting  pain, swelling, redness or irritation at the injection site  pain, tingling, numbness in the hands or feet  problems with balance, talking, walking  seizures  stomach pain  trouble passing urine or change in the amount of urine Side effects that usually do not require medical attention (report to your doctor or health care professional if they continue or are bothersome):  diarrhea  hair loss  jaw pain  loss of appetite This list may not describe all possible side effects. Call your doctor for medical advice about side effects. You may report side effects to FDA at 1-800-FDA-1088. Where should I keep my medicine? This drug is given in a hospital or clinic and will not be stored at home. NOTE: This sheet is a summary. It may not cover all possible information. If you have questions about this medicine, talk to your doctor, pharmacist, or health care provider.  2021 Elsevier/Gold Standard (2019-08-01  17:05:13)  Doxorubicin injection What is this medicine? DOXORUBICIN (dox oh ROO bi sin) is a chemotherapy drug. It is used to treat many kinds of cancer like leukemia, lymphoma, neuroblastoma, sarcoma, and Wilms' tumor. It is also used to treat bladder cancer, breast cancer, lung cancer, ovarian cancer, stomach cancer, and thyroid cancer. This medicine may be used for other purposes; ask your health care provider or pharmacist if you have questions. COMMON BRAND NAME(S): Adriamycin, Adriamycin PFS, Adriamycin RDF, Rubex What should I tell my health care provider before I take this medicine? They need to know if you have any of these conditions:  heart disease  history of low blood counts caused by a medicine  liver disease  recent or ongoing radiation therapy  an unusual or allergic reaction to doxorubicin, other chemotherapy agents, other medicines, foods, dyes, or preservatives  pregnant or trying to get pregnant  breast-feeding How should I use this medicine? This drug is given as an infusion into a vein. It is administered in a hospital or clinic by a specially trained health care professional. If you have pain, swelling, burning or any unusual feeling around the site of your injection, tell your health care professional right away. Talk to your pediatrician regarding the use of this medicine in children. Special care may be needed. Overdosage: If you think you have taken too much of this medicine contact a poison control center or emergency room at once. NOTE: This medicine is only for you. Do not share this medicine with others. What if I miss a dose? It is important not to miss your dose. Call your doctor or health care professional if you are unable to keep an appointment. What may interact with this medicine? This medicine may interact with the following medications:  6-mercaptopurine  paclitaxel  phenytoin  St. John's Wort  trastuzumab  verapamil This list may  not describe all possible interactions. Give your health care provider a list of all the medicines, herbs, non-prescription drugs, or dietary supplements you use. Also tell them if you smoke, drink alcohol, or use illegal drugs. Some items may interact with your medicine. What should I watch for while using this medicine? This drug may make  you feel generally unwell. This is not uncommon, as chemotherapy can affect healthy cells as well as cancer cells. Report any side effects. Continue your course of treatment even though you feel ill unless your doctor tells you to stop. There is a maximum amount of this medicine you should receive throughout your life. The amount depends on the medical condition being treated and your overall health. Your doctor will watch how much of this medicine you receive in your lifetime. Tell your doctor if you have taken this medicine before. You may need blood work done while you are taking this medicine. Your urine may turn red for a few days after your dose. This is not blood. If your urine is dark or brown, call your doctor. In some cases, you may be given additional medicines to help with side effects. Follow all directions for their use. Call your doctor or health care professional for advice if you get a fever, chills or sore throat, or other symptoms of a cold or flu. Do not treat yourself. This drug decreases your body's ability to fight infections. Try to avoid being around people who are sick. This medicine may increase your risk to bruise or bleed. Call your doctor or health care professional if you notice any unusual bleeding. Talk to your doctor about your risk of cancer. You may be more at risk for certain types of cancers if you take this medicine. Do not become pregnant while taking this medicine or for 6 months after stopping it. Women should inform their doctor if they wish to become pregnant or think they might be pregnant. Men should not father a child while  taking this medicine and for 6 months after stopping it. There is a potential for serious side effects to an unborn child. Talk to your health care professional or pharmacist for more information. Do not breast-feed an infant while taking this medicine. This medicine has caused ovarian failure in some women and reduced sperm counts in some men This medicine may interfere with the ability to have a child. Talk with your doctor or health care professional if you are concerned about your fertility. This medicine may cause a decrease in Co-Enzyme Q-10. You should make sure that you get enough Co-Enzyme Q-10 while you are taking this medicine. Discuss the foods you eat and the vitamins you take with your health care professional. What side effects may I notice from receiving this medicine? Side effects that you should report to your doctor or health care professional as soon as possible:  allergic reactions like skin rash, itching or hives, swelling of the face, lips, or tongue  breathing problems  chest pain  fast or irregular heartbeat  low blood counts - this medicine may decrease the number of white blood cells, red blood cells and platelets. You may be at increased risk for infections and bleeding.  pain, redness, or irritation at site where injected  signs of infection - fever or chills, cough, sore throat, pain or difficulty passing urine  signs of decreased platelets or bleeding - bruising, pinpoint red spots on the skin, black, tarry stools, blood in the urine  swelling of the ankles, feet, hands  tiredness  weakness Side effects that usually do not require medical attention (report to your doctor or health care professional if they continue or are bothersome):  diarrhea  hair loss  mouth sores  nail discoloration or damage  nausea  red colored urine  vomiting This list may not describe  all possible side effects. Call your doctor for medical advice about side effects.  You may report side effects to FDA at 1-800-FDA-1088. Where should I keep my medicine? This drug is given in a hospital or clinic and will not be stored at home. NOTE: This sheet is a summary. It may not cover all possible information. If you have questions about this medicine, talk to your doctor, pharmacist, or health care provider.  2021 Elsevier/Gold Standard (2017-04-14 11:01:26) therapy and/or immunotherapy agents Doxorubicin, Vincristine, Cytoxan, Rituximab      To help prevent nausea and vomiting after your treatment, we encourage you to take your nausea medication as directed.  BELOW ARE SYMPTOMS THAT SHOULD BE REPORTED IMMEDIATELY: . *FEVER GREATER THAN 100.4 F (38 C) OR HIGHER . *CHILLS OR SWEATING . *NAUSEA AND VOMITING THAT IS NOT CONTROLLED WITH YOUR NAUSEA MEDICATION . *UNUSUAL SHORTNESS OF BREATH . *UNUSUAL BRUISING OR BLEEDING . *URINARY PROBLEMS (pain or burning when urinating, or frequent urination) . *BOWEL PROBLEMS (unusual diarrhea, constipation, pain near the anus) . TENDERNESS IN MOUTH AND THROAT WITH OR WITHOUT PRESENCE OF ULCERS (sore throat, sores in mouth, or a toothache) . UNUSUAL RASH, SWELLING OR PAIN  . UNUSUAL VAGINAL DISCHARGE OR ITCHING   Items with * indicate a potential emergency and should be followed up as soon as possible or go to the Emergency Department if any problems should occur.  Please show the CHEMOTHERAPY ALERT CARD or IMMUNOTHERAPY ALERT CARD at check-in to the Emergency Department and triage nurse.  Should you have questions after your visit or need to cancel or reschedule your appointment, please contact [facility]  [phone] and follow the prompts.  Office hours are 8:00 a.m. to 4:30 p.m. Monday - Friday. Please note that voicemails left after 4:00 p.m. may not be returned until the following business day.  We are closed weekends and major holidays. You have access to a nurse at all times for  urgent questions. Please call the main number to the clinic [phone] and follow the prompts.  For any non-urgent questions, you may also contact your provider using MyChart. We now offer e-Visits for anyone 82 and older to request care online for non-urgent symptoms. For details visit [email or web address].   Also download the MyChart app! Go to the app store, search "MyChart", open the app, select [facility], and log in with your MyChart username and password.  Due to Covid, a mask is required upon entering the hospital/clinic. If you do not have a mask, one will be given to you upon arrival. For doctor visits, patients may have 1 support person aged 32 or older with them. For treatment visits, patients cannot have anyone with them due to current Covid guidelines and our immunocompromised population.

## 2021-01-22 NOTE — Progress Notes (Signed)
Vitals reviewed with MD and treatment team. Per MD to continue with treatment.  Jennifer Cole [employer]

## 2021-01-22 NOTE — Progress Notes (Signed)
Patient here for pre-treatment check she has no complaints today.

## 2021-01-22 NOTE — Progress Notes (Signed)
  Oncology Nurse Navigator Documentation  Navigator Location: [facility]Med Onc (01/22/21 1300)   )Navigator Encounter Type: Lobby (01/22/21 1300)                     Patient Visit Type: MedOnc (01/22/21 1300)   Barriers/Navigation Needs: No Barriers At This Time;No Needs (01/22/21 1300)   Interventions: None Required (01/22/21 1300)             met with patient in the lobby prior to receiving chemo treatment. All questions answered during visit. Pt does not have any needs at this time. Instructed pt to call with any further questions or needs. Pt verbalized understanding.          Time Spent with Patient: 15 (01/22/21 1300)

## 2021-01-24 ENCOUNTER — Other Ambulatory Visit: Payer: Self-pay

## 2021-01-24 ENCOUNTER — Inpatient Hospital Stay: Payer: 59

## 2021-01-24 DIAGNOSIS — C8520 Mediastinal (thymic) large B-cell lymphoma, unspecified site: Secondary | ICD-10-CM

## 2021-01-24 DIAGNOSIS — Z5112 Encounter for antineoplastic immunotherapy: Secondary | ICD-10-CM | POA: Diagnosis not present

## 2021-01-24 MED ORDER — PEGFILGRASTIM-CBQV 6 MG/0.6ML ~~LOC~~ SOSY
6.0000 mg | PREFILLED_SYRINGE | Freq: Once | SUBCUTANEOUS | Status: AC
  Administered 2021-01-24: 6 mg via SUBCUTANEOUS
  Filled 2021-01-24: qty 0.6

## 2021-01-27 ENCOUNTER — Other Ambulatory Visit: Payer: Self-pay

## 2021-02-04 ENCOUNTER — Inpatient Hospital Stay: Payer: 59

## 2021-02-04 ENCOUNTER — Other Ambulatory Visit: Payer: Self-pay

## 2021-02-04 ENCOUNTER — Telehealth: Payer: Self-pay | Admitting: *Deleted

## 2021-02-04 VITALS — BP 111/83 | HR 102

## 2021-02-04 DIAGNOSIS — Z95828 Presence of other vascular implants and grafts: Secondary | ICD-10-CM

## 2021-02-04 DIAGNOSIS — R42 Dizziness and giddiness: Secondary | ICD-10-CM

## 2021-02-04 DIAGNOSIS — Z5112 Encounter for antineoplastic immunotherapy: Secondary | ICD-10-CM | POA: Diagnosis not present

## 2021-02-04 MED ORDER — HEPARIN SOD (PORK) LOCK FLUSH 100 UNIT/ML IV SOLN
500.0000 [IU] | Freq: Once | INTRAVENOUS | Status: AC
  Administered 2021-02-04: 500 [IU] via INTRAVENOUS
  Filled 2021-02-04: qty 5

## 2021-02-04 MED ORDER — SODIUM CHLORIDE 0.9 % IV SOLN
Freq: Once | INTRAVENOUS | Status: AC
Start: 2021-02-04 — End: 2021-02-04
  Filled 2021-02-04: qty 250

## 2021-02-04 NOTE — Telephone Encounter (Signed)
Yes ma'am - pt has arranged her own transportation for today. Thank you!

## 2021-02-04 NOTE — Telephone Encounter (Signed)
Pt states that is feeling lightheaded and recently had BP checked at [facility] over the weekend with a reading of 86/64. Pt states is eating and drinking well.   Please advise.

## 2021-02-04 NOTE — Telephone Encounter (Signed)
Per Benjie Karvonen, pt can come in around 215 this afternoon for IV fluids. Message left with patient regarding appt this afternoon.

## 2021-02-04 NOTE — Progress Notes (Signed)
Pt states that she has felt "light headed" on and off for about 1-2 weeks now. No falls. States it is very intermittent. States BP over the weekend was 86/64, which was taken at [facility]. See orthostatic vitals in VS flow sheet. Per Dr. Grayland Ormond, pt to receive 1L NS.

## 2021-02-04 NOTE — Telephone Encounter (Signed)
Can she come in this afternoon?

## 2021-02-06 ENCOUNTER — Other Ambulatory Visit: Payer: Self-pay

## 2021-02-06 ENCOUNTER — Inpatient Hospital Stay: Payer: 59

## 2021-02-06 ENCOUNTER — Ambulatory Visit
Admission: RE | Admit: 2021-02-06 | Discharge: 2021-02-06 | Disposition: A | Discharge status: Home / Self Care | Payer: 59 | Source: Ambulatory Visit | Attending: Oncology | Admitting: Oncology

## 2021-02-06 DIAGNOSIS — C8520 Mediastinal (thymic) large B-cell lymphoma, unspecified site: Secondary | ICD-10-CM | POA: Insufficient documentation

## 2021-02-06 DIAGNOSIS — R222 Localized swelling, mass and lump, trunk: Secondary | ICD-10-CM | POA: Insufficient documentation

## 2021-02-06 DIAGNOSIS — I700 Atherosclerosis of aorta: Secondary | ICD-10-CM | POA: Insufficient documentation

## 2021-02-06 LAB — GLUCOSE, CAPILLARY: Glucose-Capillary: 95 mg/dL (ref 70–99)

## 2021-02-06 MED ORDER — FLUDEOXYGLUCOSE F - 18 (FDG) INJECTION
11.2000 | Freq: Once | INTRAVENOUS | Status: AC | PRN
  Administered 2021-02-06: 11.99 via INTRAVENOUS

## 2021-02-06 NOTE — Progress Notes (Deleted)
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  She continues to have intermittent flank pain at the site of her previous Pleurx catheter, but otherwise feels well.  She is tolerating her treatments without significant side effects. She has no neurologic complaints.  She denies any recent fevers or illnesses.  She has a fair appetite, but denies weight loss.  She has no chest pain, shortness of breath, cough or hemoptysis.  She denies any nausea, vomiting, constipation, or diarrhea.  She has no urinary complaints.  Patient offers no further specific complaints today.  REVIEW OF SYSTEMS:   Review of Systems  Constitutional: Negative.  Negative for fever, malaise/fatigue and weight loss.  Respiratory: Negative for cough, hemoptysis and shortness of breath.   Cardiovascular: Negative.  Negative for chest pain and leg swelling.  Gastrointestinal: Negative.  Negative for abdominal pain and nausea.  Genitourinary: Positive for flank pain. Negative for dysuria.  Musculoskeletal: Negative for back pain.  Skin: Negative.   Neurological: Negative.  Negative for dizziness, focal weakness, weakness and headaches.  Psychiatric/Behavioral: Negative.  The patient is not nervous/anxious.     As per HPI. Otherwise, a complete review of systems is negative.  PAST MEDICAL HISTORY: Past Medical History:  Diagnosis Date  . Cancer (HCC)    cervical  . Depression   . Non Hodgkin's lymphoma ([place])     PAST SURGICAL HISTORY: Past Surgical History:  Procedure Laterality Date  . ABDOMINAL HYSTERECTOMY    .  CESAREAN SECTION    . CHOLECYSTECTOMY    . IR PERC PLEURAL DRAIN W/INDWELL CATH W/IMG GUIDE  11/11/2020  . IR REMOVAL OF PLURAL CATH W/CUFF  12/13/2020  . PORTA CATH INSERTION N/A 11/18/2020   Procedure: PORTA CATH INSERTION;  Surgeon: Algernon Huxley, MD;  Location: [place] [facility];  Service: Cardiovascular;  Laterality: N/A;    FAMILY HISTORY: Family History  Problem Relation Age of Onset  . Breast cancer Neg Hx   . Ovarian cancer Neg Hx   . Colon cancer Neg Hx   . Diabetes Neg Hx     ADVANCED DIRECTIVES (Y/N):  N  HEALTH MAINTENANCE: Social History   Tobacco Use  . Smoking status: Never Smoker  . Smokeless tobacco: Never Used  Substance Use Topics  . Alcohol use: Never  . Drug use: Never     Colonoscopy:  PAP:  Bone density:  Lipid panel:  No Known Allergies  Current Outpatient Medications  Medication Sig Dispense Refill  . allopurinol (ZYLOPRIM) 300 MG tablet Take 1 tablet (300 mg total) by mouth daily. 30 tablet 3  . apixaban (ELIQUIS) 5 MG TABS tablet Take 1 tablet (5 mg total) by mouth 2 (two) times daily. 60 tablet 2  . folic acid (FOLVITE) 1 MG tablet Take 1 mg by mouth daily.    Marland Kitchen gabapentin (NEURONTIN) 100 MG capsule Take 1 capsule (100 mg total) by mouth 3 (three) times daily. 30 capsule 0  . mirtazapine (REMERON) 15 MG tablet Take 1 tablet (15 mg total) by mouth at bedtime. 30 tablet 2  . predniSONE (DELTASONE) 20 MG tablet Take 5 tablets (100 mg total) by  mouth daily. Take with food on days 1-5 of chemotherapy. 25 tablet 4  . prochlorperazine (COMPAZINE) 10 MG tablet Take 1 tablet (10 mg total) by mouth every 6 (six) hours as needed (Nausea or vomiting). 60 tablet 2   No current facility-administered medications for this visit.    OBJECTIVE: There were no vitals filed for this visit.   There is no height or weight on file to calculate BMI.    ECOG FS:0 - Asymptomatic  General: Well-developed, well-nourished, no acute distress. Eyes: Pink conjunctiva,  anicteric sclera. HEENT: Normocephalic, moist mucous membranes. Lungs: No audible wheezing or coughing. Heart: Regular rate and rhythm. Abdomen: Soft, nontender, no obvious distention. Musculoskeletal: No edema, cyanosis, or clubbing. Neuro: Alert, answering all questions appropriately. Cranial nerves grossly intact. Skin: No rashes or petechiae noted. Psych: Normal affect.  LAB RESULTS:  Lab Results  Component Value Date   NA 139 01/22/2021   K 4.1 01/22/2021   CL 103 01/22/2021   CO2 27 01/22/2021   GLUCOSE 96 01/22/2021   BUN 17 01/22/2021   CREATININE 0.52 01/22/2021   CALCIUM 9.2 01/22/2021   PROT 6.6 01/22/2021   ALBUMIN 4.0 01/22/2021   AST 35 01/22/2021   ALT 31 01/22/2021   ALKPHOS 83 01/22/2021   BILITOT 0.6 01/22/2021   GFRNONAA >60 01/22/2021    Lab Results  Component Value Date   WBC 4.3 01/22/2021   NEUTROABS 2.6 01/22/2021   HGB 11.3 (L) 01/22/2021   HCT 33.9 (L) 01/22/2021   MCV 91.4 01/22/2021   PLT 276 01/22/2021     STUDIES: No results found.  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   PLAN:    1.  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  Bone marrow biopsy is negative for lymphoma.  Cardiac echo completed on November 19, 2020 revealed an EF of 55 to 60%.  Repeat in June 2022.  Proceed with cycle 4 of Truxima plus CHOP today.  Return to clinic in 2 days for [facility] and then in 3 weeks for further evaluation and consideration of cycle 5.  We will do PET scan prior to next treatment.  Plan to do a total of 6 cycles followed by consolidation XRT.   2.  Shortness of breath/cough: Resolved.  Pleurx catheter has been removed. 3.  Pain: Intermittent, monitor. 4.  Headache: Resolved.  MRI of the brain on October 30, 2020 did not report any significant pathology. 5.  Right arm DVT: Continue  Eliquis as prescribed at least through the duration of her treatments.  Okay to use port.  6.  Leukopenia: Resolved.  Udenyca as above. 7.  Transaminitis: Resolved. 7.  Hypotension: Resolved.  Patient expressed understanding and was in agreement with this plan. She also understands that She can call clinic at any time with any questions, concerns, or complaints.   Cancer Staging Mediastinal (thymic) large B-cell lymphoma ([place]) Staging form: Hodgkin and Non-Hodgkin Lymphoma, AJCC 8th Edition - Clinical stage from 11/13/2020: Stage II bulky (Diffuse large B-cell lymphoma) - Signed by Lloyd Huger, MD on 11/13/2020   Lloyd Huger, MD   02/06/2021 5:05 PM

## 2021-02-12 ENCOUNTER — Inpatient Hospital Stay: Payer: 59 | Admitting: Oncology

## 2021-02-12 ENCOUNTER — Inpatient Hospital Stay: Payer: 59

## 2021-02-12 ENCOUNTER — Inpatient Hospital Stay: Payer: 59 | Admitting: Hospice and Palliative Medicine

## 2021-02-12 DIAGNOSIS — C8520 Mediastinal (thymic) large B-cell lymphoma, unspecified site: Secondary | ICD-10-CM

## 2021-02-14 ENCOUNTER — Inpatient Hospital Stay: Payer: 59

## 2021-02-14 NOTE — Progress Notes (Signed)
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  Treatment was delayed 1 week secondary to an appointment palpitations she continues to feel well and remains asymptomatic.  She does not complain of flank pain today.  She has no neurologic complaints.  She denies any recent fevers or illnesses.  She has a fair appetite, but denies weight loss.  She has no chest pain, shortness of breath, cough or hemoptysis.  She denies any nausea, vomiting, constipation, or diarrhea.  She has no urinary complaints.  Patient offers no specific complaints today.  REVIEW OF SYSTEMS:   Review of Systems  Constitutional: Negative.  Negative for fever, malaise/fatigue and weight loss.  Respiratory:  Negative for cough, hemoptysis and shortness of breath.   Cardiovascular: Negative.  Negative for chest pain and leg swelling.  Gastrointestinal: Negative.  Negative for abdominal pain and nausea.  Genitourinary: Negative.  Negative for dysuria and flank pain.  Musculoskeletal:  Negative for back pain.  Skin: Negative.   Neurological: Negative.  Negative for dizziness, focal weakness, weakness and headaches.  Psychiatric/Behavioral: Negative.  The patient is not nervous/anxious.    As per HPI. Otherwise, a complete review of systems is negative.  PAST MEDICAL HISTORY: Past Medical History:  Diagnosis Date   Cancer (HCC)    cervical   Depression    Non Hodgkin's lymphoma (HCC)     PAST SURGICAL HISTORY: Past Surgical History:  Procedure Laterality Date   ABDOMINAL HYSTERECTOMY      CESAREAN SECTION     CHOLECYSTECTOMY     IR PERC PLEURAL DRAIN W/INDWELL CATH W/IMG GUIDE  11/11/2020   IR REMOVAL OF PLURAL CATH W/CUFF  12/13/2020   PORTA CATH INSERTION N/A 11/18/2020   Procedure: PORTA CATH INSERTION;  Surgeon: Dew, Jason S, MD;  Location: [facility] INVASIVE CV LAB;  Service: Cardiovascular;  Laterality: N/A;    FAMILY HISTORY: Family History  Problem Relation Age of Onset   Breast cancer Neg Hx    Ovarian cancer Neg Hx    Colon cancer Neg Hx    Diabetes Neg Hx     ADVANCED DIRECTIVES (Y/N):  N  HEALTH MAINTENANCE: Social History   Tobacco Use   Smoking status: Never   Smokeless tobacco: Never  Substance Use Topics   Alcohol use: Never   Drug use: Never     Colonoscopy:  PAP:  Bone density:  Lipid panel:  No Known Allergies  Current Outpatient Medications  Medication Sig Dispense Refill   allopurinol (ZYLOPRIM) 300 MG tablet Take 1 tablet (300 mg total) by mouth daily. 30 tablet 3   folic acid (FOLVITE) 1 MG tablet Take 1 mg by mouth daily.     gabapentin (NEURONTIN) 100 MG capsule Take 1 capsule (100 mg total) by mouth 3 (three) times daily. 30 capsule 0   predniSONE (DELTASONE) 20 MG tablet Take 5 tablets (100 mg total) by mouth daily. Take with food on days 1-5 of chemotherapy. 25 tablet 4   prochlorperazine (COMPAZINE) 10 MG tablet Take 1 tablet (10 mg total) by mouth every 6 (six) hours as needed (Nausea or vomiting). 60 tablet   2   apixaban (ELIQUIS) 5 MG TABS tablet Take 1 tablet (5 mg total) by mouth 2 (two) times daily. 60 tablet 2   mirtazapine (REMERON) 30 MG tablet Take 1 tablet (30 mg total) by mouth at bedtime. 30 tablet 0   No current facility-administered medications for this visit.   Facility-Administered Medications Ordered in Other Visits  Medication Dose Route Frequency Provider Last Rate Last Admin   heparin lock flush 100 unit/mL  500 Units Intracatheter Once PRN Lloyd Huger, MD        OBJECTIVE: Vitals:   02/20/21  0850  BP: 111/80  Pulse: (!) 109  Resp: 20  Temp: 97.9 F (36.6 C)  SpO2: 96%     Body mass index is 36.06 kg/m.    ECOG FS:0 - Asymptomatic  General: Well-developed, well-nourished, no acute distress. Eyes: Pink conjunctiva, anicteric sclera. HEENT: Normocephalic, moist mucous membranes. Lungs: No audible wheezing or coughing. Heart: Regular rate and rhythm. Abdomen: Soft, nontender, no obvious distention. Musculoskeletal: No edema, cyanosis, or clubbing. Neuro: Alert, answering all questions appropriately. Cranial nerves grossly intact. Skin: No rashes or petechiae noted. Psych: Normal affect.   LAB RESULTS:  Lab Results  Component Value Date   NA 141 02/20/2021   K 3.8 02/20/2021   CL 104 02/20/2021   CO2 26 02/20/2021   GLUCOSE 97 02/20/2021   BUN 21 (H) 02/20/2021   CREATININE 0.51 02/20/2021   CALCIUM 9.2 02/20/2021   PROT 6.8 02/20/2021   ALBUMIN 3.9 02/20/2021   AST 35 02/20/2021   ALT 29 02/20/2021   ALKPHOS 76 02/20/2021   BILITOT 0.7 02/20/2021   GFRNONAA >60 02/20/2021    Lab Results  Component Value Date   WBC 4.8 02/20/2021   NEUTROABS 2.9 02/20/2021   HGB 11.1 (L) 02/20/2021   HCT 32.8 (L) 02/20/2021   MCV 94.3 02/20/2021   PLT 262 02/20/2021     STUDIES: NM PET Image Restag (PS) Skull Base To Thigh  Result Date: 02/07/2021 CLINICAL DATA:  Subsequent treatment strategy for lymphoma. EXAM: NUCLEAR MEDICINE PET SKULL BASE TO THIGH TECHNIQUE: 11.99 mCi F-18 FDG was injected intravenously. Full-ring PET imaging was performed from the skull base to thigh after the radiotracer. CT data was obtained and used for attenuation correction and anatomic localization. Fasting blood glucose: 95 mg/dl COMPARISON:  2/618/22 FINDINGS: Mediastinal blood pool activity: SUV max 2.98 Liver activity: SUV max 3.36 NECK: No hypermetabolic lymph nodes in the neck. Incidental CT findings: none CHEST: Anterior mediastinal lung mass measures 5.1 x 2.8 cm with an SUV max of  4.37 (Deauville criteria 4), image 99/3. Previously this measured 13.3 x 8.7 cm with SUV max of 32.62. Interval resolution of FDG avid left supraclavicular lymph node. Previous FDG avid left pleural nodules have resolved in the interval. The right pleural effusion has also resolved in the interval. Previously noted pulmonary nodules have also resolved in the interval. Incidental CT findings: Mild aortic atherosclerosis. ABDOMEN/PELVIS: No abnormal FDG uptake within the liver, pancreas, spleen, or adrenal glands. No FDG avid abdominopelvic lymph nodes. Incidental CT findings: Status post cholecystectomy. Small fat containing umbilical hernia. SKELETON: There is diffuse radiotracer uptake throughout the bone marrow which is favored to represent post treatment changes. Incidental CT findings: none IMPRESSION: 1. Interval response to therapy. Considerable decrease in size of large, FDG avid mediastinal mass. Residual FDG avid anterior mediastinal soft tissue is identified on today's study compatible with residual disease (Deauville criteria 4) 2. Interval resolution of FDG avid left  supraclavicular lymph node, left pleural nodules, small pulmonary nodules, and right pleural effusion. 3. No new sites of disease. 4.  Aortic Atherosclerosis (ICD10-I70.0). Electronically Signed   By: Kerby Moors M.D.   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   PLAN:    1.  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  Bone marrow biopsy is negative for lymphoma.  Cardiac echo completed on November 19, 2020 revealed an EF of 55 to 60%.  Repeat in June 2022.  PET scan results Feb 07, 2021 reviewed independently and reported as above with significant interval response to therapy.  Proceed with cycle 5 of Truxima plus CHOP today.  Return to clinic  tomorrow for [facility] and then in 3 weeks for further evaluation and consideration of her sixth and final cycle of treatment.  Patient will also be evaluated by radiation oncology that day for consideration of adjuvant XRT.  She will require cardiac echo prior to her final treatment. 2.  Shortness of breath/cough: Resolved.  Pleurx catheter has been removed. 3.  Pain: Patient does not complain of this today. 4.  Headache: Resolved.  MRI of the brain on October 30, 2020 did not report any significant pathology. 5.  Right arm DVT: Continue Eliquis as prescribed at least through the duration of her treatments.  Okay to use port.  6.  Leukopenia: Resolved.  Udenyca as above. 7.  Anemia: Mild, monitor.  Patient expressed understanding and was in agreement with this plan. She also understands that She can call clinic at any time with any questions, concerns, or complaints.   Cancer Staging Mediastinal (thymic) large B-cell lymphoma ([place]) Staging form: Hodgkin and Non-Hodgkin Lymphoma, AJCC 8th Edition - Clinical stage from 11/13/2020: Stage II bulky (Diffuse large B-cell lymphoma) - Signed by Lloyd Huger, MD on 11/13/2020   Lloyd Huger, MD   02/20/2021 1:04 PM

## 2021-02-18 ENCOUNTER — Encounter: Payer: Self-pay | Admitting: Oncology

## 2021-02-19 ENCOUNTER — Other Ambulatory Visit: Payer: Self-pay

## 2021-02-19 ENCOUNTER — Ambulatory Visit (INDEPENDENT_AMBULATORY_CARE_PROVIDER_SITE_OTHER): Payer: 59 | Admitting: Licensed Clinical Social Worker

## 2021-02-19 DIAGNOSIS — F331 Major depressive disorder, recurrent, moderate: Secondary | ICD-10-CM
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
## 2021-02-20 ENCOUNTER — Other Ambulatory Visit: Payer: Self-pay

## 2021-02-20 ENCOUNTER — Inpatient Hospital Stay: Payer: 59 | Attending: Oncology | Admitting: Oncology

## 2021-02-20 ENCOUNTER — Inpatient Hospital Stay: Payer: 59

## 2021-02-20 ENCOUNTER — Inpatient Hospital Stay (HOSPITAL_BASED_OUTPATIENT_CLINIC_OR_DEPARTMENT_OTHER): Payer: 59 | Admitting: Hospice and Palliative Medicine

## 2021-02-20 ENCOUNTER — Other Ambulatory Visit: Payer: Self-pay | Admitting: *Deleted

## 2021-02-20 ENCOUNTER — Encounter: Payer: Self-pay | Admitting: Oncology

## 2021-02-20 VITALS — BP 111/80 | HR 109 | Temp 97.9°F | Resp 20 | Wt 223.4 lb

## 2021-02-20 DIAGNOSIS — C8520 Mediastinal (thymic) large B-cell lymphoma, unspecified site: Secondary | ICD-10-CM

## 2021-02-20 DIAGNOSIS — Z5112 Encounter for antineoplastic immunotherapy: Secondary | ICD-10-CM | POA: Diagnosis not present

## 2021-02-20 DIAGNOSIS — Z5189 Encounter for other specified aftercare: Secondary | ICD-10-CM | POA: Diagnosis not present

## 2021-02-20 DIAGNOSIS — Z95828 Presence of other vascular implants and grafts: Secondary | ICD-10-CM

## 2021-02-20 DIAGNOSIS — Z515 Encounter for palliative care: Secondary | ICD-10-CM

## 2021-02-20 DIAGNOSIS — Z7901 Long term (current) use of anticoagulants: Secondary | ICD-10-CM | POA: Diagnosis not present

## 2021-02-20 DIAGNOSIS — Z9049 Acquired absence of other specified parts of digestive tract: Secondary | ICD-10-CM | POA: Diagnosis not present

## 2021-02-20 DIAGNOSIS — Z79899 Other long term (current) drug therapy: Secondary | ICD-10-CM | POA: Insufficient documentation

## 2021-02-20 DIAGNOSIS — F32A Depression, unspecified: Secondary | ICD-10-CM

## 2021-02-20 DIAGNOSIS — Z5111 Encounter for antineoplastic chemotherapy: Secondary | ICD-10-CM | POA: Diagnosis present

## 2021-02-20 DIAGNOSIS — D649 Anemia, unspecified: Secondary | ICD-10-CM | POA: Diagnosis not present

## 2021-02-20 LAB — COMPREHENSIVE METABOLIC PANEL
ALT: 29 U/L (ref 0–44)
AST: 35 U/L (ref 15–41)
Albumin: 3.9 g/dL (ref 3.5–5.0)
Alkaline Phosphatase: 76 U/L (ref 38–126)
Anion gap: 11 (ref 5–15)
BUN: 21 mg/dL — ABNORMAL HIGH (ref 6–20)
CO2: 26 mmol/L (ref 22–32)
Calcium: 9.2 mg/dL (ref 8.9–10.3)
Chloride: 104 mmol/L (ref 98–111)
Creatinine, Ser: 0.51 mg/dL (ref 0.44–1.00)
GFR, Estimated: >60 mL/min (ref >60)
Glucose, Bld: 97 mg/dL (ref 70–99)
Potassium: 3.8 mmol/L (ref 3.5–5.1)
Sodium: 141 mmol/L (ref 135–145)
Total Bilirubin: 0.7 mg/dL (ref 0.3–1.2)
Total Protein: 6.8 g/dL (ref 6.5–8.1)

## 2021-02-20 LAB — CBC WITH DIFFERENTIAL/PLATELET
Abs Immature Granulocytes: 0.03 10*3/uL (ref 0.00–0.07)
Basophils Absolute: 0.1 10*3/uL (ref 0.0–0.1)
Basophils Relative: 2 %
Eosinophils Absolute: 0.3 10*3/uL (ref 0.0–0.5)
Eosinophils Relative: 7 %
HCT: 32.8 % — ABNORMAL LOW (ref 36.0–46.0)
Hemoglobin: 11.1 g/dL — ABNORMAL LOW (ref 12.0–15.0)
Immature Granulocytes: 1 %
Lymphocytes Relative: 14 %
Lymphs Abs: 0.7 10*3/uL (ref 0.7–4.0)
MCH: 31.9 pg (ref 26.0–34.0)
MCHC: 33.8 g/dL (ref 30.0–36.0)
MCV: 94.3 fL (ref 80.0–100.0)
Monocytes Absolute: 0.8 10*3/uL (ref 0.1–1.0)
Monocytes Relative: 16 %
Neutro Abs: 2.9 10*3/uL (ref 1.7–7.7)
Neutrophils Relative %: 60 %
Platelets: 262 10*3/uL (ref 150–400)
RBC: 3.48 MIL/uL — ABNORMAL LOW (ref 3.87–5.11)
RDW: 17.9 % — ABNORMAL HIGH (ref 11.5–15.5)
WBC: 4.8 10*3/uL (ref 4.0–10.5)
nRBC: 0 % (ref 0.0–0.2)

## 2021-02-20 MED ORDER — PALONOSETRON HCL INJECTION 0.25 MG/5ML
0.2500 mg | Freq: Once | INTRAVENOUS | Status: AC
  Administered 2021-02-20: 0.25 mg via INTRAVENOUS
  Filled 2021-02-20: qty 5

## 2021-02-20 MED ORDER — SODIUM CHLORIDE 0.9 % IV SOLN
10.0000 mg | Freq: Once | INTRAVENOUS | Status: AC
  Administered 2021-02-20: 10 mg via INTRAVENOUS
  Filled 2021-02-20: qty 10

## 2021-02-20 MED ORDER — HEPARIN SOD (PORK) LOCK FLUSH 100 UNIT/ML IV SOLN
500.0000 [IU] | Freq: Once | INTRAVENOUS | Status: AC | PRN
  Administered 2021-02-20: 500 [IU]
  Filled 2021-02-20: qty 5

## 2021-02-20 MED ORDER — SODIUM CHLORIDE 0.9 % IV SOLN
375.0000 mg/m2 | Freq: Once | INTRAVENOUS | Status: AC
  Administered 2021-02-20: 800 mg via INTRAVENOUS
  Filled 2021-02-20: qty 50

## 2021-02-20 MED ORDER — ELIQUIS 5 MG PO TABS
5.0000 mg | ORAL_TABLET | Freq: Two times a day (BID) | ORAL | 2 refills | Status: DC
  Filled 2021-02-20: qty 60, 30d supply, fill #0
  Filled 2021-03-25: qty 60, 30d supply, fill #1
  Filled 2021-04-25: qty 60, 30d supply, fill #2

## 2021-02-20 MED ORDER — SODIUM CHLORIDE 0.9 % IV SOLN
150.0000 mg | Freq: Once | INTRAVENOUS | Status: AC
  Administered 2021-02-20: 150 mg via INTRAVENOUS
  Filled 2021-02-20: qty 150

## 2021-02-20 MED ORDER — SODIUM CHLORIDE 0.9% FLUSH
10.0000 mL | Freq: Once | INTRAVENOUS | Status: AC
Start: 2021-02-20 — End: 2021-02-20
  Administered 2021-02-20: 10 mL via INTRAVENOUS
  Filled 2021-02-20: qty 10

## 2021-02-20 MED ORDER — VINCRISTINE SULFATE CHEMO INJECTION 1 MG/ML
2.0000 mg | Freq: Once | INTRAVENOUS | Status: AC
  Administered 2021-02-20: 2 mg via INTRAVENOUS
  Filled 2021-02-20: qty 2

## 2021-02-20 MED ORDER — DIPHENHYDRAMINE HCL 25 MG PO CAPS
25.0000 mg | ORAL_CAPSULE | Freq: Once | ORAL | Status: AC
  Administered 2021-02-20: 25 mg via ORAL
  Filled 2021-02-20: qty 1

## 2021-02-20 MED ORDER — DOXORUBICIN HCL CHEMO IV INJECTION 2 MG/ML
100.0000 mg | Freq: Once | INTRAVENOUS | Status: AC
  Administered 2021-02-20: 100 mg via INTRAVENOUS
  Filled 2021-02-20: qty 50

## 2021-02-20 MED ORDER — SODIUM CHLORIDE 0.9 % IV SOLN
Freq: Once | INTRAVENOUS | Status: AC
  Filled 2021-02-20: qty 250

## 2021-02-20 MED ORDER — ACETAMINOPHEN 325 MG PO TABS
650.0000 mg | ORAL_TABLET | Freq: Once | ORAL | Status: AC
  Administered 2021-02-20: 650 mg via ORAL
  Filled 2021-02-20: qty 2

## 2021-02-20 MED ORDER — MIRTAZAPINE 30 MG PO TABS: 30.0000 mg | ORAL_TABLET | Freq: Every day | ORAL | 0 refills | Status: DC

## 2021-02-20 MED ORDER — SODIUM CHLORIDE 0.9 % IV SOLN
750.0000 mg/m2 | Freq: Once | INTRAVENOUS | Status: AC
  Administered 2021-02-20: 1600 mg via INTRAVENOUS
  Filled 2021-02-20: qty 80

## 2021-02-20 MED ORDER — HEPARIN SOD (PORK) LOCK FLUSH 100 UNIT/ML IV SOLN
500.0000 [IU] | Freq: Once | INTRAVENOUS | Status: DC
  Filled 2021-02-20: qty 5

## 2021-02-20 NOTE — Progress Notes (Signed)
Per RN pt HR normally elevated. Ok to treat HR 108

## 2021-02-20 NOTE — Patient Instructions (Signed)
[facility] ONCOL[facility]  Discharge Instructions: Thank you for choosing [facility] to provide your oncology and hematology care.  If you have a lab appointment with the [facility], please go directly to the [facility] and check in at the registration area.  Wear comfortable clothing and clothing appropriate for easy access to any Portacath or PICC line.   We strive to give you quality time with your provider. You may need to reschedule your appointment if you arrive late (15 or more minutes).  Arriving late affects you and other patients whose appointments are after yours.  Also, if you miss three or more appointments without notifying the office, you may be dismissed from the clinic at the provider's discretion.      For prescription refill requests, have your pharmacy contact our office and allow 72 hours for refills to be completed.    Today you received the following chemotherapy and/or immunotherapy agents       To help prevent nausea and vomiting after your treatment, we encourage you to take your nausea medication as directed.  BELOW ARE SYMPTOMS THAT SHOULD BE REPORTED IMMEDIATELY: *FEVER GREATER THAN 100.4 F (38 C) OR HIGHER *CHILLS OR SWEATING *NAUSEA AND VOMITING THAT IS NOT CONTROLLED WITH YOUR NAUSEA MEDICATION *UNUSUAL SHORTNESS OF BREATH *UNUSUAL BRUISING OR BLEEDING *URINARY PROBLEMS (pain or burning when urinating, or frequent urination) *BOWEL PROBLEMS (unusual diarrhea, constipation, pain near the anus) TENDERNESS IN MOUTH AND THROAT WITH OR WITHOUT PRESENCE OF ULCERS (sore throat, sores in mouth, or a toothache) UNUSUAL RASH, SWELLING OR PAIN  UNUSUAL VAGINAL DISCHARGE OR ITCHING   Items with * indicate a potential emergency and should be followed up as soon as possible or go to the Emergency Department if any problems should occur.  Please show the CHEMOTHERAPY ALERT CARD or IMMUNOTHERAPY ALERT CARD at check-in to the  Emergency Department and triage nurse.  Should you have questions after your visit or need to cancel or reschedule your appointment, please contact [facility]  [phone] and follow the prompts.  Office hours are 8:00 a.m. to 4:30 p.m. Monday - Friday. Please note that voicemails left after 4:00 p.m. may not be returned until the following business day.  We are closed weekends and major holidays. You have access to a nurse at all times for urgent questions. Please call the main number to the clinic [phone] and follow the prompts.  For any non-urgent questions, you may also contact your provider using MyChart. We now offer e-Visits for anyone 34 and older to request care online for non-urgent symptoms. For details visit [email or web address].   Also download the MyChart app! Go to the app store, search "MyChart", open the app, select [facility], and log in with your MyChart username and password.  Due to Covid, a mask is required upon entering the hospital/clinic. If you do not have a mask, one will be given to you upon arrival. For doctor visits, patients may have 1 support person aged 70 or older with them. For treatment visits, patients cannot have anyone with them due to current Covid guidelines and our immunocompromised population.

## 2021-02-20 NOTE — Progress Notes (Signed)
[facility]  Telephone:([phone] [phone]   Name: Jennifer Cole Date: 02/20/2021 MRN: [id number]  DOB: Feb 12, 1966  Patient Care Team: System, Provider Not In as PCP - General Telford Nab, RN as Oncology Nurse Navigator    REASON FOR CONSULTATION: Jennifer Cole is a 55 y.o. 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  Patient has had depression and anxiety since her cancer diagnosis.  She also has unresolved grief following the loss of her daughter in Nov 30, 2019.  Patient was referred to palliative care to help address goals and manage ongoing symptoms.  SOCIAL HISTORY:     reports that she has never smoked. She has never used smokeless tobacco. She reports that she does not drink alcohol and does not use drugs.  Patient is unmarried.  She lives with a boyfriend since Nov 29, 2012.  Her son also lives in the home.  She has a daughter who lives nearby.  Patient had another daughter who died at age 75 from cancer in 11/30/2019.  Patient previously worked at the [employer].  She is now trying to get disability.  ADVANCE DIRECTIVES:  Does not have  CODE STATUS:   PAST MEDICAL HISTORY: Past Medical History:  Diagnosis Date   Cancer ([place])    cervical   Depression    Non Hodgkin's lymphoma ([place])     PAST SURGICAL HISTORY:  Past Surgical History:  Procedure Laterality Date   ABDOMINAL HYSTERECTOMY     CESAREAN SECTION     CHOLECYSTECTOMY     IR PERC PLEURAL DRAIN W/INDWELL CATH W/IMG GUIDE  11/11/2020   IR REMOVAL OF PLURAL CATH W/CUFF  12/13/2020   PORTA CATH INSERTION N/A 11/18/2020   Procedure: PORTA CATH INSERTION;  Surgeon: Algernon Huxley, MD;  Location: [facility];  Service: Cardiovascular;  Laterality: N/A;    HEMATOLOGY/ONCOLOGY HISTORY:  Oncology History  Mediastinal (thymic)  large B-cell lymphoma ([place])  11/13/2020 Initial Diagnosis   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  MEDICATIONS:  Current Outpatient Medications  Medication Sig Dispense Refill   allopurinol (ZYLOPRIM) 300 MG tablet Take 1 tablet (300 mg total) by mouth daily. 30 tablet 3   apixaban (ELIQUIS) 5 MG TABS tablet Take 1 tablet (5 mg total) by mouth 2 (two) times daily. 60 tablet 2   folic acid (FOLVITE) 1 MG tablet Take 1 mg by mouth daily.     gabapentin (NEURONTIN) 100 MG capsule Take 1 capsule (100 mg total) by mouth 3 (three) times daily. 30 capsule 0   mirtazapine (REMERON) 15 MG tablet Take 1 tablet (15 mg total) by mouth at bedtime. 30 tablet 2   predniSONE (DELTASONE) 20 MG tablet Take 5 tablets (100 mg total) by mouth daily. Take with food on days 1-5 of chemotherapy. 25 tablet 4   prochlorperazine (COMPAZINE) 10 MG tablet Take 1 tablet (10 mg total) by mouth every 6 (six) hours as needed (Nausea or vomiting). 60 tablet 2   No  current facility-administered medications for this visit.   Facility-Administered Medications Ordered in Other Visits  Medication Dose Route Frequency Provider Last Rate Last Admin   cyclophosphamide (CYTOXAN) 1,600 mg in sodium chloride 0.9 % 250 mL chemo infusion  750 mg/m2 (Treatment Plan Recorded) Intravenous Once Lloyd Huger, MD       heparin lock flush 100 unit/mL  500 Units Intracatheter Once PRN Lloyd Huger, MD       riTUXimab-abbs (TRUXIMA) 800 mg in sodium chloride 0.9 % 250 mL (2.4242 mg/mL) infusion  375 mg/m2 (Treatment Plan Recorded) Intravenous Once Lloyd Huger, MD       vinCRIStine (ONCOVIN) 2 mg in sodium chloride 0.9 % 50 mL chemo infusion  2 mg Intravenous Once Lloyd Huger, MD        VITAL SIGNS: There were no vitals taken for this visit. There were no vitals filed for this visit.  Estimated body mass index is 36.06 kg/m as calculated from the following:   Height as of 01/08/21: 5\' 6"  (1.676 m).   Weight as of an earlier encounter on 02/20/21: 223 lb 6.4 oz (101.3 kg).  LABS: CBC:    Component Value Date/Time   WBC 4.8 02/20/2021 0800   HGB 11.1 (L) 02/20/2021 0800   HCT 32.8 (L) 02/20/2021 0800   PLT 262 02/20/2021 0800   MCV 94.3 02/20/2021 0800   NEUTROABS 2.9 02/20/2021 0800   LYMPHSABS 0.7 02/20/2021 0800   MONOABS 0.8 02/20/2021 0800   EOSABS 0.3 02/20/2021 0800   BASOSABS 0.1 02/20/2021 0800   Comprehensive Metabolic Panel:    Component Value Date/Time   NA 141 02/20/2021 0800   K 3.8 02/20/2021 0800   CL 104 02/20/2021 0800   CO2 26 02/20/2021 0800   BUN 21 (H) 02/20/2021 0800   CREATININE 0.51 02/20/2021 0800   GLUCOSE 97 02/20/2021 0800   CALCIUM 9.2 02/20/2021 0800   AST 35 02/20/2021 0800   ALT 29 02/20/2021 0800   ALKPHOS 76 02/20/2021 0800   BILITOT 0.7 02/20/2021 0800   PROT 6.8 02/20/2021 0800   ALBUMIN 3.9 02/20/2021 0800    RADIOGRAPHIC STUDIES: NM PET Image Restag (PS) Skull Base To Thigh  Result Date: 02/07/2021 CLINICAL DATA:  Subsequent treatment strategy for lymphoma. EXAM: NUCLEAR MEDICINE PET SKULL BASE TO THIGH TECHNIQUE: 11.99 mCi F-18 FDG was injected intravenously. Full-ring PET imaging was performed from the skull base to thigh after the radiotracer. CT data was obtained and used for attenuation correction and anatomic localization. Fasting blood glucose: 95 mg/dl COMPARISON:  2/618/22 FINDINGS: Mediastinal blood pool activity: SUV max 2.98 Liver activity: SUV max 3.36 NECK: No hypermetabolic lymph nodes in the neck. Incidental CT findings: none CHEST: Anterior mediastinal lung mass  measures 5.1 x 2.8 cm with an SUV max of 4.37 (Deauville criteria 4), image 99/3. Previously this measured 13.3 x 8.7 cm with SUV max of 32.62. Interval resolution of FDG avid left supraclavicular lymph node. Previous FDG avid left pleural nodules have resolved in the interval. The right pleural effusion has also resolved in the interval. Previously noted pulmonary nodules have also resolved in the interval. Incidental CT findings: Mild aortic atherosclerosis. ABDOMEN/PELVIS: No abnormal FDG uptake within the liver, pancreas, spleen, or adrenal glands. No FDG avid abdominopelvic lymph nodes. Incidental CT findings: Status post cholecystectomy. Small fat containing umbilical hernia. SKELETON: There is diffuse radiotracer uptake throughout the bone marrow which is favored to represent post treatment changes. Incidental CT findings: none IMPRESSION: 1. Interval  response to therapy. Considerable decrease in size of large, FDG avid mediastinal mass. Residual FDG avid anterior mediastinal soft tissue is identified on today's study compatible with residual disease (Deauville criteria 4) 2. Interval resolution of FDG avid left supraclavicular lymph node, left pleural nodules, small pulmonary nodules, and right pleural effusion. 3. No new sites of disease. 4.  Aortic Atherosclerosis (ICD10-I70.0). Electronically Signed   By: Kerby Moors M.D.   On: 02/07/2021 17:07     PERFORMANCE STATUS (ECOG) : 1 - Symptomatic but completely ambulatory  Review of Systems Unless otherwise noted, a complete review of systems is negative.  Physical Exam General: NAD Pulmonary: Unlabored Extremities: no edema, no joint deformities Skin: no rashes Neurological: Grossly nonfocal  IMPRESSION: Routine follow-up.  Patient is in infusion.  Patient reports that she is doing reasonably well.  However, she continues to endorse ongoing depressive symptoms.  She initially felt that her moods were improved with mirtazapine but no  longer feels like it is as effective as it once was.  She asked about increasing the dose today.  She says that she saw psychiatry yesterday but upon review of the chart, it appears that she was seen for counseling.  We will reach out to her counselor to see if she can see a psychiatrist in the same practice.  In interim, will increase dose of mirtazapine to 30 mg nightly.  PLAN: -Continue current scope of treatment -Referral for outpatient psychiatry -Increase mirtazapine 30 mg nightly -Follow BMI -RTC in 3 weeks   Patient expressed understanding and was in agreement with this plan. She also understands that She can call the clinic at any time with any questions, concerns, or complaints.     Time Total: 25 minutes  Visit consisted of counseling and education dealing with the complex and emotionally intense issues of symptom management and palliative care in the setting of serious and potentially life-threatening illness.Greater than 50%  of this time was spent counseling and coordinating care related to the above assessment and plan.  Signed by: Altha Harm, PhD, NP-C

## 2021-02-21 ENCOUNTER — Inpatient Hospital Stay: Payer: 59

## 2021-02-21 ENCOUNTER — Other Ambulatory Visit: Payer: Self-pay

## 2021-02-21 DIAGNOSIS — C8520 Mediastinal (thymic) large B-cell lymphoma, unspecified site: Secondary | ICD-10-CM

## 2021-02-21 DIAGNOSIS — Z5112 Encounter for antineoplastic immunotherapy: Secondary | ICD-10-CM | POA: Diagnosis not present

## 2021-02-21 MED ORDER — PEGFILGRASTIM-CBQV 6 MG/0.6ML ~~LOC~~ SOSY
6.0000 mg | PREFILLED_SYRINGE | Freq: Once | SUBCUTANEOUS | Status: AC
  Administered 2021-02-21: 6 mg via SUBCUTANEOUS
  Filled 2021-02-21: qty 0.6

## 2021-02-28 ENCOUNTER — Telehealth: Payer: Self-pay | Admitting: *Deleted

## 2021-02-28 NOTE — Telephone Encounter (Signed)
Sent disability papers to [facility]. 

## 2021-03-05 ENCOUNTER — Encounter: Payer: Self-pay | Admitting: Oncology

## 2021-03-08 ENCOUNTER — Other Ambulatory Visit: Payer: Self-pay | Admitting: Oncology

## 2021-03-08 DIAGNOSIS — C8520 Mediastinal (thymic) large B-cell lymphoma, unspecified site: Secondary | ICD-10-CM

## 2021-03-08 NOTE — Progress Notes (Signed)
[facility]  Telephone:([phone] [phone]  ID: Jennifer Cole OB: July 01, 1966  MR#: [id number]  UUE#:[id number]  Patient Care Team: System, Provider Not In as PCP - General Telford Nab, RN as Oncology Nurse Navigator  CHIEF COMPLAINT: Stage II bulky primary mediastinal B-cell lymphoma  INTERVAL HISTORY: Patient returns to clinic today for further evaluation and consideration of her sixth and final cycle of Truxima plus CHOP.  She currently feels well and is asymptomatic.  She no longer has right flank pain. She has no neurologic complaints.  She denies any recent fevers or illnesses.  She has a fair appetite, but denies weight loss.  She has no chest pain, shortness of breath, cough or hemoptysis.  She denies any nausea, vomiting, constipation, or diarrhea.  She has no urinary complaints.  Patient offers no specific complaints today.  REVIEW OF SYSTEMS:   Review of Systems  Constitutional: Negative.  Negative for fever, malaise/fatigue and weight loss.  Respiratory:  Negative for cough, hemoptysis and shortness of breath.   Cardiovascular: Negative.  Negative for chest pain and leg swelling.  Gastrointestinal: Negative.  Negative for abdominal pain and nausea.  Genitourinary: Negative.  Negative for dysuria and flank pain.  Musculoskeletal:  Negative for back pain.  Skin: Negative.   Neurological: Negative.  Negative for dizziness, focal weakness, weakness and headaches.  Psychiatric/Behavioral: Negative.  The patient is not nervous/anxious.    As per HPI. Otherwise, a complete review of systems is negative.  PAST MEDICAL HISTORY: Past Medical History:  Diagnosis Date   Cancer ([place])    cervical   Depression    Non Hodgkin's lymphoma ([place])     PAST SURGICAL HISTORY: Past Surgical History:  Procedure Laterality Date   ABDOMINAL HYSTERECTOMY     CESAREAN SECTION     CHOLECYSTECTOMY     IR PERC PLEURAL DRAIN W/INDWELL CATH W/IMG GUIDE   11/11/2020   IR REMOVAL OF PLURAL CATH W/CUFF  12/13/2020   PORTA CATH INSERTION N/A 11/18/2020   Procedure: PORTA CATH INSERTION;  Surgeon: Algernon Huxley, MD;  Location: [facility];  Service: Cardiovascular;  Laterality: N/A;    FAMILY HISTORY: Family History  Problem Relation Age of Onset   Breast cancer Neg Hx    Ovarian cancer Neg Hx    Colon cancer Neg Hx    Diabetes Neg Hx     ADVANCED DIRECTIVES (Y/N):  N  HEALTH MAINTENANCE: Social History   Tobacco Use   Smoking status: Never   Smokeless tobacco: Never  Substance Use Topics   Alcohol use: Never   Drug use: Never     Colonoscopy:  PAP:  Bone density:  Lipid panel:  No Known Allergies  Current Outpatient Medications  Medication Sig Dispense Refill   allopurinol (ZYLOPRIM) 300 MG tablet Take 1 tablet (300 mg total) by mouth daily. 30 tablet 3   apixaban (ELIQUIS) 5 MG TABS tablet Take 1 tablet (5 mg total) by mouth 2 (two) times daily. 60 tablet 2   folic acid (FOLVITE) 1 MG tablet Take 1 mg by mouth daily.     gabapentin (NEURONTIN) 100 MG capsule Take 1 capsule (100 mg total) by mouth 3 (three) times daily. 30 capsule 0   mirtazapine (REMERON) 30 MG tablet Take 1 tablet (30 mg total) by mouth at bedtime. 30 tablet 0   predniSONE (DELTASONE) 20 MG tablet TAKE FIVE (5) TABLETS BY MOUTH DAILY; TAKE WITH FOOD ON DAYS 1-5 OF CHEMOTHERAPY 25 tablet 0  prochlorperazine (COMPAZINE) 10 MG tablet Take 1 tablet (10 mg total) by mouth every 6 (six) hours as needed (Nausea or vomiting). 60 tablet 2   No current facility-administered medications for this visit.    OBJECTIVE: Vitals:   03/12/21 0850  BP: 107/77  Pulse: (!) 103  Resp: 20  Temp: 97.6 F (36.4 C)     Body mass index is 37.04 kg/m.    ECOG FS:0 - Asymptomatic  General: Well-developed, well-nourished, no acute distress. Eyes: Pink conjunctiva, anicteric sclera. HEENT: Normocephalic, moist mucous membranes. Lungs: No audible wheezing or  coughing. Heart: Regular rate and rhythm. Abdomen: Soft, nontender, no obvious distention. Musculoskeletal: No edema, cyanosis, or clubbing. Neuro: Alert, answering all questions appropriately. Cranial nerves grossly intact. Skin: No rashes or petechiae noted. Psych: Normal affect.   LAB RESULTS:  Lab Results  Component Value Date   NA 140 03/12/2021   K 4.1 03/12/2021   CL 107 03/12/2021   CO2 27 03/12/2021   GLUCOSE 99 03/12/2021   BUN 20 03/12/2021   CREATININE 0.48 03/12/2021   CALCIUM 9.1 03/12/2021   PROT 6.6 03/12/2021   ALBUMIN 3.7 03/12/2021   AST 36 03/12/2021   ALT 31 03/12/2021   ALKPHOS 99 03/12/2021   BILITOT 0.5 03/12/2021   GFRNONAA >60 03/12/2021    Lab Results  Component Value Date   WBC 5.2 03/12/2021   NEUTROABS 3.5 03/12/2021   HGB 11.5 (L) 03/12/2021   HCT 35.1 (L) 03/12/2021   MCV 97.2 03/12/2021   PLT 291 03/12/2021     STUDIES: No results found.   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   PLAN:    1.  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  Bone marrow biopsy is negative for lymphoma.  Cardiac echo completed on November 19, 2020 revealed an EF of 55 to 60%.  Repeat in June 2022.  PET scan results Feb 07, 2021 reviewed independently and reported as above with significant interval response to therapy.  Proceed with cycle 6 of Truxima plus CHOP today.  Return to clinic tomorrow for [facility].  Patient then has consultation with radiation oncology on March 14, 2021 for consideration of consolidation XRT to her mediastinal mass.  Repeat echocardiogram is scheduled for March 28, 2021.  Return to clinic on April 24, 2021 for further evaluation at which point, will schedule recent staging imaging.   2.  Shortness of breath/cough: Resolved.  Pleurx catheter has been removed. 3.  Pain:  Resolved. 4.  Headache: Resolved.  MRI of the brain on October 30, 2020 did not report any significant pathology. 5.  Right arm DVT: Continue Eliquis as prescribed at least through the duration of her treatments.  Okay to use port.  Once patient completes all of her treatments, will remove port and discontinue Eliquis. 6.  Leukopenia: Resolved.  Udenyca as above. 7.  Anemia: Chronic and unchanged.  Patient's hemoglobin is 11.5 today.  Patient expressed understanding and was in agreement with this plan. She also understands that She can call clinic at any time with any questions, concerns, or complaints.   Cancer Staging Mediastinal (thymic) large B-cell lymphoma ([place]) Staging form: Hodgkin and Non-Hodgkin Lymphoma, AJCC 8th Edition - Clinical stage from 11/13/2020: Stage II bulky (Diffuse large B-cell lymphoma) - Signed by Lloyd Huger, MD on 11/13/2020   Lloyd Huger, MD   03/13/2021 6:38 PM

## 2021-03-10 ENCOUNTER — Other Ambulatory Visit: Payer: Self-pay | Admitting: *Deleted

## 2021-03-10 MED ORDER — MIRTAZAPINE 30 MG PO TABS: 30.0000 mg | ORAL_TABLET | Freq: Every day | ORAL | 0 refills | Status: DC

## 2021-03-12 ENCOUNTER — Inpatient Hospital Stay: Payer: 59

## 2021-03-12 ENCOUNTER — Other Ambulatory Visit: Payer: Self-pay

## 2021-03-12 ENCOUNTER — Inpatient Hospital Stay (HOSPITAL_BASED_OUTPATIENT_CLINIC_OR_DEPARTMENT_OTHER): Payer: 59 | Admitting: Oncology

## 2021-03-12 ENCOUNTER — Encounter: Payer: Self-pay | Admitting: Oncology

## 2021-03-12 VITALS — BP 107/77 | HR 103 | Temp 97.6°F | Resp 20 | Wt 229.5 lb

## 2021-03-12 VITALS — HR 106

## 2021-03-12 DIAGNOSIS — C8520 Mediastinal (thymic) large B-cell lymphoma, unspecified site: Secondary | ICD-10-CM

## 2021-03-12 DIAGNOSIS — Z5112 Encounter for antineoplastic immunotherapy: Secondary | ICD-10-CM | POA: Diagnosis not present

## 2021-03-12 LAB — CBC WITH DIFFERENTIAL/PLATELET
Abs Immature Granulocytes: 0.04 10*3/uL (ref 0.00–0.07)
Basophils Absolute: 0.1 10*3/uL (ref 0.0–0.1)
Basophils Relative: 2 %
Eosinophils Absolute: 0.2 10*3/uL (ref 0.0–0.5)
Eosinophils Relative: 4 %
HCT: 35.1 % — ABNORMAL LOW (ref 36.0–46.0)
Hemoglobin: 11.5 g/dL — ABNORMAL LOW (ref 12.0–15.0)
Immature Granulocytes: 1 %
Lymphocytes Relative: 14 %
Lymphs Abs: 0.7 10*3/uL (ref 0.7–4.0)
MCH: 31.9 pg (ref 26.0–34.0)
MCHC: 32.8 g/dL (ref 30.0–36.0)
MCV: 97.2 fL (ref 80.0–100.0)
Monocytes Absolute: 0.6 10*3/uL (ref 0.1–1.0)
Monocytes Relative: 12 %
Neutro Abs: 3.5 10*3/uL (ref 1.7–7.7)
Neutrophils Relative %: 67 %
Platelets: 291 10*3/uL (ref 150–400)
RBC: 3.61 MIL/uL — ABNORMAL LOW (ref 3.87–5.11)
RDW: 15.7 % — ABNORMAL HIGH (ref 11.5–15.5)
WBC: 5.2 10*3/uL (ref 4.0–10.5)
nRBC: 0 % (ref 0.0–0.2)

## 2021-03-12 LAB — COMPREHENSIVE METABOLIC PANEL
ALT: 31 U/L (ref 0–44)
AST: 36 U/L (ref 15–41)
Albumin: 3.7 g/dL (ref 3.5–5.0)
Alkaline Phosphatase: 99 U/L (ref 38–126)
Anion gap: 6 (ref 5–15)
BUN: 20 mg/dL (ref 6–20)
CO2: 27 mmol/L (ref 22–32)
Calcium: 9.1 mg/dL (ref 8.9–10.3)
Chloride: 107 mmol/L (ref 98–111)
Creatinine, Ser: 0.48 mg/dL (ref 0.44–1.00)
GFR, Estimated: >60 mL/min (ref >60)
Glucose, Bld: 99 mg/dL (ref 70–99)
Potassium: 4.1 mmol/L (ref 3.5–5.1)
Sodium: 140 mmol/L (ref 135–145)
Total Bilirubin: 0.5 mg/dL (ref 0.3–1.2)
Total Protein: 6.6 g/dL (ref 6.5–8.1)

## 2021-03-12 MED ORDER — DOXORUBICIN HCL CHEMO IV INJECTION 2 MG/ML
47.0000 mg/m2 | Freq: Once | INTRAVENOUS | Status: AC
  Administered 2021-03-12: 100 mg via INTRAVENOUS
  Filled 2021-03-12: qty 50

## 2021-03-12 MED ORDER — HEPARIN SOD (PORK) LOCK FLUSH 100 UNIT/ML IV SOLN
INTRAVENOUS | Status: AC
  Filled 2021-03-12: qty 5

## 2021-03-12 MED ORDER — CYCLOPHOSPHAMIDE CHEMO INJECTION 1 GM
750.0000 mg/m2 | Freq: Once | INTRAMUSCULAR | Status: AC
  Administered 2021-03-12: 1600 mg via INTRAVENOUS
  Filled 2021-03-12: qty 80

## 2021-03-12 MED ORDER — SODIUM CHLORIDE 0.9 % IV SOLN
150.0000 mg | Freq: Once | INTRAVENOUS | Status: AC
  Administered 2021-03-12: 150 mg via INTRAVENOUS
  Filled 2021-03-12: qty 150

## 2021-03-12 MED ORDER — ACETAMINOPHEN 325 MG PO TABS
650.0000 mg | ORAL_TABLET | Freq: Once | ORAL | Status: AC
  Administered 2021-03-12: 650 mg via ORAL
  Filled 2021-03-12: qty 2

## 2021-03-12 MED ORDER — SODIUM CHLORIDE 0.9% FLUSH
10.0000 mL | INTRAVENOUS | Status: DC | PRN
  Administered 2021-03-12: 10 mL via INTRAVENOUS
  Filled 2021-03-12: qty 10

## 2021-03-12 MED ORDER — HEPARIN SOD (PORK) LOCK FLUSH 100 UNIT/ML IV SOLN
500.0000 [IU] | Freq: Once | INTRAVENOUS | Status: DC | PRN
  Filled 2021-03-12: qty 5

## 2021-03-12 MED ORDER — DIPHENHYDRAMINE HCL 25 MG PO CAPS
25.0000 mg | ORAL_CAPSULE | Freq: Once | ORAL | Status: AC
  Administered 2021-03-12: 25 mg via ORAL
  Filled 2021-03-12: qty 1

## 2021-03-12 MED ORDER — SODIUM CHLORIDE 0.9 % IV SOLN
375.0000 mg/m2 | Freq: Once | INTRAVENOUS | Status: AC
  Administered 2021-03-12: 800 mg via INTRAVENOUS
  Filled 2021-03-12: qty 50

## 2021-03-12 MED ORDER — HEPARIN SOD (PORK) LOCK FLUSH 100 UNIT/ML IV SOLN
500.0000 [IU] | Freq: Once | INTRAVENOUS | Status: AC
  Administered 2021-03-12: 500 [IU] via INTRAVENOUS
  Filled 2021-03-12: qty 5

## 2021-03-12 MED ORDER — SODIUM CHLORIDE 0.9 % IV SOLN
10.0000 mg | Freq: Once | INTRAVENOUS | Status: AC
  Administered 2021-03-12: 10 mg via INTRAVENOUS
  Filled 2021-03-12: qty 10

## 2021-03-12 MED ORDER — PALONOSETRON HCL INJECTION 0.25 MG/5ML
0.2500 mg | Freq: Once | INTRAVENOUS | Status: AC
  Administered 2021-03-12: 0.25 mg via INTRAVENOUS
  Filled 2021-03-12: qty 5

## 2021-03-12 MED ORDER — VINCRISTINE SULFATE CHEMO INJECTION 1 MG/ML
2.0000 mg | Freq: Once | INTRAVENOUS | Status: AC
  Administered 2021-03-12: 2 mg via INTRAVENOUS
  Filled 2021-03-12: qty 2

## 2021-03-12 MED ORDER — SODIUM CHLORIDE 0.9 % IV SOLN
Freq: Once | INTRAVENOUS | Status: AC
Start: 2021-03-12 — End: 2021-03-12
  Filled 2021-03-12: qty 250

## 2021-03-12 NOTE — Patient Instructions (Signed)
[facility]  Discharge Instructions: Thank you for choosing [facility] to provide your oncology and hematology care.  If you have a lab appointment with the [facility], please go directly to the [facility] and check in at the registration area.  Wear comfortable clothing and clothing appropriate for easy access to any Portacath or PICC line.   We strive to give you quality time with your provider. You may need to reschedule your appointment if you arrive late (15 or more minutes).  Arriving late affects you and other patients whose appointments are after yours.  Also, if you miss three or more appointments without notifying the office, you may be dismissed from the clinic at the provider's discretion.      For prescription refill requests, have your pharmacy contact our office and allow 72 hours for refills to be completed.    Today you received the following chemotherapy and/or immunotherapy agents: Vincristine, Adriamycin, Cytoxan and Rituxan.      To help prevent nausea and vomiting after your treatment, we encourage you to take your nausea medication as directed.  BELOW ARE SYMPTOMS THAT SHOULD BE REPORTED IMMEDIATELY: *FEVER GREATER THAN 100.4 F (38 C) OR HIGHER *CHILLS OR SWEATING *NAUSEA AND VOMITING THAT IS NOT CONTROLLED WITH YOUR NAUSEA MEDICATION *UNUSUAL SHORTNESS OF BREATH *UNUSUAL BRUISING OR BLEEDING *URINARY PROBLEMS (pain or burning when urinating, or frequent urination) *BOWEL PROBLEMS (unusual diarrhea, constipation, pain near the anus) TENDERNESS IN MOUTH AND THROAT WITH OR WITHOUT PRESENCE OF ULCERS (sore throat, sores in mouth, or a toothache) UNUSUAL RASH, SWELLING OR PAIN  UNUSUAL VAGINAL DISCHARGE OR ITCHING   Items with * indicate a potential emergency and should be followed up as soon as possible or go to the Emergency Department if any problems should occur.  Please show the CHEMOTHERAPY ALERT CARD or  IMMUNOTHERAPY ALERT CARD at check-in to the Emergency Department and triage nurse.  Should you have questions after your visit or need to cancel or reschedule your appointment, please contact [facility]  [phone] and follow the prompts.  Office hours are 8:00 a.m. to 4:30 p.m. Monday - Friday. Please note that voicemails left after 4:00 p.m. may not be returned until the following business day.  We are closed weekends and major holidays. You have access to a nurse at all times for urgent questions. Please call the main number to the clinic [phone] and follow the prompts.  For any non-urgent questions, you may also contact your provider using MyChart. We now offer e-Visits for anyone 66 and older to request care online for non-urgent symptoms. For details visit [email or web address].   Also download the MyChart app! Go to the app store, search "MyChart", open the app, select [facility], and log in with your MyChart username and password.  Due to Covid, a mask is required upon entering the hospital/clinic. If you do not have a mask, one will be given to you upon arrival. For doctor visits, patients may have 1 support person aged 68 or older with them. For treatment visits, patients cannot have anyone with them due to current Covid guidelines and our immunocompromised population.

## 2021-03-12 NOTE — Progress Notes (Signed)
HR 106 ok to proceed per MD

## 2021-03-12 NOTE — Progress Notes (Signed)
Patient denies any concerns today.  

## 2021-03-12 NOTE — Progress Notes (Signed)
Per Dr. Grayland Ormond, ok to treat today with a HR of 106.

## 2021-03-13 ENCOUNTER — Encounter: Payer: Self-pay | Admitting: Oncology

## 2021-03-13 ENCOUNTER — Other Ambulatory Visit: Payer: 59

## 2021-03-13 ENCOUNTER — Ambulatory Visit: Payer: 59 | Admitting: Oncology

## 2021-03-13 ENCOUNTER — Ambulatory Visit: Payer: 59

## 2021-03-14 ENCOUNTER — Inpatient Hospital Stay: Payer: 59

## 2021-03-14 ENCOUNTER — Ambulatory Visit
Admission: RE | Admit: 2021-03-14 | Discharge: 2021-03-14 | Disposition: A | Discharge status: Home / Self Care | Payer: 59 | Source: Ambulatory Visit | Attending: Radiation Oncology | Admitting: Radiation Oncology

## 2021-03-14 ENCOUNTER — Ambulatory Visit: Payer: 59

## 2021-03-14 VITALS — BP 118/84 | HR 109 | Temp 97.0°F | Resp 18 | Wt 234.9 lb

## 2021-03-14 DIAGNOSIS — C8520 Mediastinal (thymic) large B-cell lymphoma, unspecified site: Secondary | ICD-10-CM

## 2021-03-14 DIAGNOSIS — R61 Generalized hyperhidrosis: Secondary | ICD-10-CM | POA: Diagnosis not present

## 2021-03-14 DIAGNOSIS — Z9221 Personal history of antineoplastic chemotherapy: Secondary | ICD-10-CM | POA: Insufficient documentation

## 2021-03-14 DIAGNOSIS — Z7901 Long term (current) use of anticoagulants: Secondary | ICD-10-CM | POA: Insufficient documentation

## 2021-03-14 DIAGNOSIS — Z79899 Other long term (current) drug therapy: Secondary | ICD-10-CM | POA: Diagnosis not present

## 2021-03-14 MED ORDER — PEGFILGRASTIM-CBQV 6 MG/0.6ML ~~LOC~~ SOSY
6.0000 mg | PREFILLED_SYRINGE | Freq: Once | SUBCUTANEOUS | Status: AC
  Administered 2021-03-14: 6 mg via SUBCUTANEOUS
  Filled 2021-03-14: qty 0.6

## 2021-03-14 NOTE — Addendum Note (Signed)
Encounter addended by: Noreene Filbert, MD on: 03/14/2021 11:03 AM  Actions taken: Clinical Note Signed, Level of Service modified

## 2021-03-14 NOTE — Consult Note (Signed)
NEW PATIENT EVALUATION  Name: Jennifer Cole  MRN: [id number]  Date:   03/14/2021     DOB: December 01, 1965   This 54 y.o. 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  REFERRING PHYSICIAN: Lloyd Huger, MD  CHIEF COMPLAINT: No chief complaint on file.   DIAGNOSIS: The encounter diagnosis was Mediastinal (thymic) large B-cell lymphoma, unspecified body region [facility]).   PREVIOUS INVESTIGATIONS:  Serial PET CT scans reviewed Clinical notes reviewed Pathology reports reviewed  HPI: Patient is a 55 year old female who presented back in January with increasing shortness of breath and fatigue.  She was found on imaging to have a large mediastinal lung mass.  CT-guided biopsy was positive for mediastinal B cell lymphoma.  Flow tight cytometry showed CD5 and CD10 B-cell population detected without light chain expression.  PET CT scan showed large markedly hypermetabolic anterior mediastinal mass involving middle mediastinal on the right side suspicious for a thymic neoplasm versus lymphoma.  She also had some small hypermetabolic pleural nodules on the left side.  She is undergone 6 cycles of Truxima plus CHOP which she tolerated well.  Repeat PET CT scan showed interval boost interval response to therapy with decreased size of hypermetabolic mediastinal mass there is some residual FDG avid areas in the anterior mediastinum Duvall criteria for.  She has had interval rouleaux revolution hypermetabolic left supraclavicular lymph nodes as well as left pleural nodules.  She is seen today for involved field consideration.  She is doing well.  She does continues to have some night sweats.  She specifically denies fever chills or weight loss.  PLANNED TREATMENT REGIMEN: Involved field radiation therapy  PAST MEDICAL HISTORY:  has a past medical history of Cancer ([place]), Depression, and Non Hodgkin's  lymphoma ([place]).    PAST SURGICAL HISTORY:  Past Surgical History:  Procedure Laterality Date   ABDOMINAL HYSTERECTOMY     CESAREAN SECTION     CHOLECYSTECTOMY     IR PERC PLEURAL DRAIN W/INDWELL CATH W/IMG GUIDE  11/11/2020   IR REMOVAL OF PLURAL CATH W/CUFF  12/13/2020   PORTA CATH INSERTION N/A 11/18/2020   Procedure: PORTA CATH INSERTION;  Surgeon: Algernon Huxley, MD;  Location: [place] [facility];  Service: Cardiovascular;  Laterality: N/A;    FAMILY HISTORY: family history is not on file.  SOCIAL HISTORY:  reports that she has never smoked. She has never used smokeless tobacco. She reports that she does not drink alcohol and does not use drugs.  ALLERGIES: Patient has no known allergies.  MEDICATIONS:  Current Outpatient Medications  Medication Sig Dispense Refill   allopurinol (ZYLOPRIM) 300 MG tablet Take 1 tablet (300 mg total) by mouth daily. 30 tablet 3   apixaban (ELIQUIS) 5 MG TABS tablet Take 1 tablet (5 mg total) by mouth 2 (two) times daily. 60 tablet 2   folic acid (FOLVITE) 1 MG tablet Take 1 mg by mouth daily.     gabapentin (NEURONTIN) 100 MG capsule Take 1 capsule (100 mg total) by mouth 3 (three) times daily. 30 capsule 0   mirtazapine (REMERON) 30 MG tablet Take 1 tablet (30 mg total) by mouth at bedtime. 30 tablet 0   predniSONE (DELTASONE) 20 MG tablet TAKE FIVE (5) TABLETS BY MOUTH DAILY; TAKE WITH FOOD ON DAYS 1-5 OF CHEMOTHERAPY 25 tablet 0   prochlorperazine (COMPAZINE) 10 MG tablet Take 1 tablet (10 mg total) by mouth every 6 (six) hours as  needed (Nausea or vomiting). 60 tablet 2   No current facility-administered medications for this encounter.    ECOG PERFORMANCE STATUS:  0 - Asymptomatic  REVIEW OF SYSTEMS: Patient denies any weight loss, fatigue, weakness, fever, chills or night sweats. Patient denies any loss of vision, blurred vision. Patient denies any ringing  of the ears or hearing loss. No irregular heartbeat. Patient denies heart murmur or  history of fainting. Patient denies any chest pain or pain radiating to her upper extremities. Patient denies any shortness of breath, difficulty breathing at night, cough or hemoptysis. Patient denies any swelling in the lower legs. Patient denies any nausea vomiting, vomiting of blood, or coffee ground material in the vomitus. Patient denies any stomach pain. Patient states has had normal bowel movements no significant constipation or diarrhea. Patient denies any dysuria, hematuria or significant nocturia. Patient denies any problems walking, swelling in the joints or loss of balance. Patient denies any skin changes, loss of hair or loss of weight. Patient denies any excessive worrying or anxiety or significant depression. Patient denies any problems with insomnia. Patient denies excessive thirst, polyuria, polydipsia. Patient denies any swollen glands, patient denies easy bruising or easy bleeding. Patient denies any recent infections, allergies or URI. Patient "s visual fields have not changed significantly in recent time.   PHYSICAL EXAM: BP 118/84   Pulse (!) 109   Temp (!) 97 F (36.1 C)   Resp 18   Wt 234 lb 14.4 oz (106.5 kg)   BMI 37.91 kg/m  No evidence of supraclavicular axillary or peripheral adenopathy.  Well-developed well-nourished patient in NAD. HEENT reveals PERLA, EOMI, discs not visualized.  Oral cavity is clear. No oral mucosal lesions are identified. Neck is clear without evidence of cervical or supraclavicular adenopathy. Lungs are clear to A&P. Cardiac examination is essentially unremarkable with regular rate and rhythm without murmur rub or thrill. Abdomen is benign with no organomegaly or masses noted. Motor sensory and DTR levels are equal and symmetric in the upper and lower extremities. Cranial nerves II through XII are grossly intact. Proprioception is intact. No peripheral adenopathy or edema is identified. No motor or sensory levels are noted. Crude visual fields are  within normal range.  LABORATORY DATA: Pathology report reviewed    RADIOLOGY RESULTS: Serial PET CT scans are reviewed compatible with above-stated findings   IMPRESSION: Mediastinal large B-cell lymphoma status post 6 cycles of Truxima plus CHOP with excellent response although residual hypermetabolic activity in the anterior mediastinum in 55 year old female  PLAN: Based on the residual hypermetabolic activity and initial large involvement of her mediastinum would recommend 40 Gray to her mediastinum to the involved field.  Would use PET fusion study for treatment planning risks and benefits of treatment including possible radiation esophagitis fatigue alteration of blood count skin reaction all were described in detail to the patient and her daughter.  They both seem to comprehend my treatment plan well.  I have chosen the dose of 40 Pearline Cables based again on review with radiology that there is some hypermetabolic to be still present.  I would like to take this opportunity to thank you for allowing me to participate in the care of your patient.Noreene Filbert, MD

## 2021-03-20 ENCOUNTER — Ambulatory Visit
Admission: RE | Admit: 2021-03-20 | Discharge: 2021-03-20 | Disposition: A | Discharge status: Home / Self Care | Payer: 59 | Source: Ambulatory Visit | Attending: Radiation Oncology | Admitting: Radiation Oncology

## 2021-03-20 DIAGNOSIS — C8520 Mediastinal (thymic) large B-cell lymphoma, unspecified site: Secondary | ICD-10-CM | POA: Diagnosis present

## 2021-03-20 DIAGNOSIS — Z510 Encounter for antineoplastic radiation therapy: Secondary | ICD-10-CM | POA: Diagnosis not present

## 2021-03-21 ENCOUNTER — Other Ambulatory Visit: Payer: Self-pay | Admitting: *Deleted

## 2021-03-21 DIAGNOSIS — C8520 Mediastinal (thymic) large B-cell lymphoma, unspecified site: Secondary | ICD-10-CM

## 2021-03-24 DIAGNOSIS — Z510 Encounter for antineoplastic radiation therapy: Secondary | ICD-10-CM | POA: Diagnosis not present

## 2021-03-25 ENCOUNTER — Other Ambulatory Visit: Payer: Self-pay

## 2021-03-26 ENCOUNTER — Ambulatory Visit (HOSPITAL_COMMUNITY): Payer: 59 | Admitting: Licensed Clinical Social Worker

## 2021-03-26 ENCOUNTER — Encounter (HOSPITAL_COMMUNITY): Payer: Self-pay

## 2021-03-27 ENCOUNTER — Ambulatory Visit: Admission: RE | Admit: 2021-03-27 | Payer: 59 | Source: Ambulatory Visit

## 2021-03-27 ENCOUNTER — Other Ambulatory Visit: Payer: Self-pay

## 2021-03-27 ENCOUNTER — Inpatient Hospital Stay: Payer: 59

## 2021-03-27 DIAGNOSIS — Z510 Encounter for antineoplastic radiation therapy: Secondary | ICD-10-CM | POA: Diagnosis not present

## 2021-03-28 ENCOUNTER — Ambulatory Visit
Admission: RE | Admit: 2021-03-28 | Discharge: 2021-03-28 | Disposition: A | Discharge status: Home / Self Care | Payer: 59 | Source: Ambulatory Visit | Attending: Oncology | Admitting: Oncology

## 2021-03-28 ENCOUNTER — Inpatient Hospital Stay: Payer: 59

## 2021-03-28 DIAGNOSIS — C8520 Mediastinal (thymic) large B-cell lymphoma, unspecified site: Secondary | ICD-10-CM | POA: Diagnosis not present

## 2021-03-28 DIAGNOSIS — Z0189 Encounter for other specified special examinations: Secondary | ICD-10-CM

## 2021-03-28 LAB — ECHOCARDIOGRAM COMPLETE
AR max vel: 5.38 cm2
AV Area VTI: 5.57 cm2
AV Area mean vel: 5.09 cm2
AV Mean grad: 3 mmHg
AV Peak grad: 5.3 mmHg
Ao pk vel: 1.15 m/s
Area-P 1/2: 9.14 cm2
MV VTI: 8.38 cm2
S' Lateral: 2.2 cm

## 2021-03-28 NOTE — Progress Notes (Signed)
*  PRELIMINARY RESULTS* Echocardiogram 2D Echocardiogram has been performed.  Jennifer Cole 03/28/2021, 11:46 AM

## 2021-03-31 ENCOUNTER — Inpatient Hospital Stay: Payer: 59

## 2021-03-31 ENCOUNTER — Other Ambulatory Visit: Payer: Self-pay

## 2021-03-31 ENCOUNTER — Ambulatory Visit
Admission: RE | Admit: 2021-03-31 | Discharge: 2021-03-31 | Disposition: A | Discharge status: Home / Self Care | Payer: 59 | Source: Ambulatory Visit | Attending: Radiation Oncology | Admitting: Radiation Oncology

## 2021-03-31 DIAGNOSIS — C8520 Mediastinal (thymic) large B-cell lymphoma, unspecified site: Secondary | ICD-10-CM | POA: Diagnosis present

## 2021-03-31 DIAGNOSIS — Z510 Encounter for antineoplastic radiation therapy: Secondary | ICD-10-CM | POA: Diagnosis not present

## 2021-04-01 ENCOUNTER — Ambulatory Visit
Admission: RE | Admit: 2021-04-01 | Discharge: 2021-04-01 | Disposition: A | Discharge status: Home / Self Care | Payer: 59 | Source: Ambulatory Visit | Attending: Radiation Oncology | Admitting: Radiation Oncology

## 2021-04-01 ENCOUNTER — Encounter: Payer: Self-pay | Admitting: *Deleted

## 2021-04-01 ENCOUNTER — Inpatient Hospital Stay: Payer: 59

## 2021-04-01 DIAGNOSIS — Z510 Encounter for antineoplastic radiation therapy: Secondary | ICD-10-CM | POA: Diagnosis not present

## 2021-04-02 ENCOUNTER — Inpatient Hospital Stay: Payer: 59

## 2021-04-02 ENCOUNTER — Ambulatory Visit
Admission: RE | Admit: 2021-04-02 | Discharge: 2021-04-02 | Disposition: A | Discharge status: Home / Self Care | Payer: 59 | Source: Ambulatory Visit | Attending: Radiation Oncology | Admitting: Radiation Oncology

## 2021-04-02 DIAGNOSIS — Z510 Encounter for antineoplastic radiation therapy: Secondary | ICD-10-CM | POA: Diagnosis not present

## 2021-04-03 ENCOUNTER — Ambulatory Visit
Admission: RE | Admit: 2021-04-03 | Discharge: 2021-04-03 | Disposition: A | Discharge status: Home / Self Care | Payer: 59 | Source: Ambulatory Visit | Attending: Radiation Oncology | Admitting: Radiation Oncology

## 2021-04-03 ENCOUNTER — Inpatient Hospital Stay: Payer: 59

## 2021-04-03 DIAGNOSIS — Z510 Encounter for antineoplastic radiation therapy: Secondary | ICD-10-CM | POA: Diagnosis not present

## 2021-04-04 ENCOUNTER — Inpatient Hospital Stay: Payer: 59

## 2021-04-04 ENCOUNTER — Ambulatory Visit
Admission: RE | Admit: 2021-04-04 | Discharge: 2021-04-04 | Disposition: A | Discharge status: Home / Self Care | Payer: 59 | Source: Ambulatory Visit | Attending: Radiation Oncology | Admitting: Radiation Oncology

## 2021-04-04 DIAGNOSIS — Z510 Encounter for antineoplastic radiation therapy: Secondary | ICD-10-CM | POA: Diagnosis not present

## 2021-04-07 ENCOUNTER — Telehealth (HOSPITAL_COMMUNITY): Payer: 59

## 2021-04-07 ENCOUNTER — Other Ambulatory Visit: Payer: Self-pay

## 2021-04-07 ENCOUNTER — Ambulatory Visit
Admission: RE | Admit: 2021-04-07 | Discharge: 2021-04-07 | Disposition: A | Discharge status: Home / Self Care | Payer: 59 | Source: Ambulatory Visit | Attending: Radiation Oncology | Admitting: Radiation Oncology

## 2021-04-07 ENCOUNTER — Inpatient Hospital Stay: Payer: 59

## 2021-04-07 DIAGNOSIS — Z510 Encounter for antineoplastic radiation therapy: Secondary | ICD-10-CM | POA: Diagnosis not present

## 2021-04-08 ENCOUNTER — Ambulatory Visit
Admission: RE | Admit: 2021-04-08 | Discharge: 2021-04-08 | Disposition: A | Discharge status: Home / Self Care | Payer: 59 | Source: Ambulatory Visit | Attending: Radiation Oncology | Admitting: Radiation Oncology

## 2021-04-08 ENCOUNTER — Inpatient Hospital Stay: Payer: 59

## 2021-04-08 DIAGNOSIS — Z510 Encounter for antineoplastic radiation therapy: Secondary | ICD-10-CM | POA: Diagnosis not present

## 2021-04-09 ENCOUNTER — Ambulatory Visit: Payer: 59 | Attending: Radiation Oncology

## 2021-04-09 ENCOUNTER — Other Ambulatory Visit: Payer: Self-pay | Admitting: *Deleted

## 2021-04-09 ENCOUNTER — Ambulatory Visit: Admission: RE | Admit: 2021-04-09 | Payer: 59 | Source: Ambulatory Visit

## 2021-04-09 ENCOUNTER — Encounter: Payer: Self-pay | Admitting: *Deleted

## 2021-04-09 ENCOUNTER — Inpatient Hospital Stay: Payer: 59

## 2021-04-09 ENCOUNTER — Ambulatory Visit: Payer: 59

## 2021-04-09 DIAGNOSIS — Z510 Encounter for antineoplastic radiation therapy: Secondary | ICD-10-CM | POA: Insufficient documentation

## 2021-04-09 DIAGNOSIS — C8520 Mediastinal (thymic) large B-cell lymphoma, unspecified site: Secondary | ICD-10-CM | POA: Insufficient documentation

## 2021-04-10 ENCOUNTER — Inpatient Hospital Stay: Payer: 59

## 2021-04-10 ENCOUNTER — Ambulatory Visit
Admission: RE | Admit: 2021-04-10 | Discharge: 2021-04-10 | Disposition: A | Discharge status: Home / Self Care | Payer: 59 | Source: Ambulatory Visit | Attending: Radiation Oncology | Admitting: Radiation Oncology

## 2021-04-10 ENCOUNTER — Other Ambulatory Visit: Payer: Self-pay

## 2021-04-10 DIAGNOSIS — C8520 Mediastinal (thymic) large B-cell lymphoma, unspecified site: Secondary | ICD-10-CM

## 2021-04-10 DIAGNOSIS — Z510 Encounter for antineoplastic radiation therapy: Secondary | ICD-10-CM | POA: Diagnosis not present

## 2021-04-10 LAB — CBC
HCT: 35.4 % — ABNORMAL LOW (ref 36.0–46.0)
Hemoglobin: 11.7 g/dL — ABNORMAL LOW (ref 12.0–15.0)
MCH: 32.6 pg (ref 26.0–34.0)
MCHC: 33.1 g/dL (ref 30.0–36.0)
MCV: 98.6 fL (ref 80.0–100.0)
Platelets: 278 10*3/uL (ref 150–400)
RBC: 3.59 MIL/uL — ABNORMAL LOW (ref 3.87–5.11)
RDW: 13.7 % (ref 11.5–15.5)
WBC: 5.1 10*3/uL (ref 4.0–10.5)
nRBC: 0 % (ref 0.0–0.2)

## 2021-04-11 ENCOUNTER — Inpatient Hospital Stay: Payer: 59

## 2021-04-11 ENCOUNTER — Ambulatory Visit
Admission: RE | Admit: 2021-04-11 | Discharge: 2021-04-11 | Disposition: A | Discharge status: Home / Self Care | Payer: 59 | Source: Ambulatory Visit | Attending: Radiation Oncology | Admitting: Radiation Oncology

## 2021-04-11 DIAGNOSIS — Z510 Encounter for antineoplastic radiation therapy: Secondary | ICD-10-CM | POA: Diagnosis not present

## 2021-04-14 ENCOUNTER — Other Ambulatory Visit: Payer: Self-pay | Admitting: Licensed Clinical Social Worker

## 2021-04-14 ENCOUNTER — Inpatient Hospital Stay: Payer: 59

## 2021-04-14 ENCOUNTER — Ambulatory Visit
Admission: RE | Admit: 2021-04-14 | Discharge: 2021-04-14 | Disposition: A | Discharge status: Home / Self Care | Payer: 59 | Source: Ambulatory Visit | Attending: Radiation Oncology | Admitting: Radiation Oncology

## 2021-04-14 DIAGNOSIS — D649 Anemia, unspecified: Secondary | ICD-10-CM | POA: Insufficient documentation

## 2021-04-14 DIAGNOSIS — I82621 Acute embolism and thrombosis of deep veins of right upper extremity: Secondary | ICD-10-CM | POA: Insufficient documentation

## 2021-04-14 DIAGNOSIS — Z7901 Long term (current) use of anticoagulants: Secondary | ICD-10-CM | POA: Insufficient documentation

## 2021-04-14 DIAGNOSIS — Z510 Encounter for antineoplastic radiation therapy: Secondary | ICD-10-CM | POA: Insufficient documentation

## 2021-04-14 DIAGNOSIS — R059 Cough, unspecified: Secondary | ICD-10-CM | POA: Insufficient documentation

## 2021-04-14 DIAGNOSIS — C8520 Mediastinal (thymic) large B-cell lymphoma, unspecified site: Secondary | ICD-10-CM | POA: Insufficient documentation

## 2021-04-14 DIAGNOSIS — R109 Unspecified abdominal pain: Secondary | ICD-10-CM | POA: Insufficient documentation

## 2021-04-14 MED ORDER — SUCRALFATE 1 G PO TABS: 1.0000 g | ORAL_TABLET | Freq: Three times a day (TID) | ORAL | 0 refills | Status: DC

## 2021-04-15 ENCOUNTER — Inpatient Hospital Stay: Payer: 59

## 2021-04-15 ENCOUNTER — Ambulatory Visit
Admission: RE | Admit: 2021-04-15 | Discharge: 2021-04-15 | Disposition: A | Discharge status: Home / Self Care | Payer: 59 | Source: Ambulatory Visit | Attending: Radiation Oncology | Admitting: Radiation Oncology

## 2021-04-15 DIAGNOSIS — Z510 Encounter for antineoplastic radiation therapy: Secondary | ICD-10-CM | POA: Diagnosis not present

## 2021-04-16 ENCOUNTER — Inpatient Hospital Stay: Payer: 59

## 2021-04-16 ENCOUNTER — Ambulatory Visit
Admission: RE | Admit: 2021-04-16 | Discharge: 2021-04-16 | Disposition: A | Discharge status: Home / Self Care | Payer: 59 | Source: Ambulatory Visit | Attending: Radiation Oncology | Admitting: Radiation Oncology

## 2021-04-16 DIAGNOSIS — Z510 Encounter for antineoplastic radiation therapy: Secondary | ICD-10-CM | POA: Diagnosis not present

## 2021-04-17 ENCOUNTER — Ambulatory Visit
Admission: RE | Admit: 2021-04-17 | Discharge: 2021-04-17 | Disposition: A | Discharge status: Home / Self Care | Payer: 59 | Source: Ambulatory Visit | Attending: Radiation Oncology | Admitting: Radiation Oncology

## 2021-04-17 ENCOUNTER — Inpatient Hospital Stay: Payer: 59

## 2021-04-17 DIAGNOSIS — Z510 Encounter for antineoplastic radiation therapy: Secondary | ICD-10-CM | POA: Diagnosis not present

## 2021-04-17 DIAGNOSIS — C8520 Mediastinal (thymic) large B-cell lymphoma, unspecified site: Secondary | ICD-10-CM

## 2021-04-17 LAB — CBC
HCT: 35.7 % — ABNORMAL LOW (ref 36.0–46.0)
Hemoglobin: 11.9 g/dL — ABNORMAL LOW (ref 12.0–15.0)
MCH: 32.2 pg (ref 26.0–34.0)
MCHC: 33.3 g/dL (ref 30.0–36.0)
MCV: 96.7 fL (ref 80.0–100.0)
Platelets: 271 10*3/uL (ref 150–400)
RBC: 3.69 MIL/uL — ABNORMAL LOW (ref 3.87–5.11)
RDW: 13.2 % (ref 11.5–15.5)
WBC: 4.4 10*3/uL (ref 4.0–10.5)
nRBC: 0 % (ref 0.0–0.2)

## 2021-04-18 ENCOUNTER — Ambulatory Visit
Admission: RE | Admit: 2021-04-18 | Discharge: 2021-04-18 | Disposition: A | Discharge status: Home / Self Care | Payer: 59 | Source: Ambulatory Visit | Attending: Radiation Oncology | Admitting: Radiation Oncology

## 2021-04-18 ENCOUNTER — Inpatient Hospital Stay: Payer: 59

## 2021-04-18 DIAGNOSIS — Z510 Encounter for antineoplastic radiation therapy: Secondary | ICD-10-CM | POA: Diagnosis not present

## 2021-04-18 NOTE — Progress Notes (Signed)
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  She has approximately 1 week left of her adjuvant XRT.  She has noticed increased cough as well as flank pain.  She also complains of dysphagia that is helped with Carafate.  She has no neurologic complaints.  She denies any recent fevers or illnesses.  She has a fair appetite, but denies weight loss.  She denies any chest pain, shortness of breath, or hemoptysis.  She denies any nausea, vomiting, constipation, or diarrhea.  She has no urinary complaints.  Patient offers no further specific complaints today.  REVIEW OF SYSTEMS:   Review of Systems  Constitutional: Negative.  Negative for fever, malaise/fatigue and weight loss.  Respiratory:  Positive for cough. Negative for hemoptysis and shortness of breath.   Cardiovascular: Negative.  Negative for chest pain and leg swelling.  Gastrointestinal: Negative.  Negative for abdominal pain and nausea.  Genitourinary:  Positive for flank pain. Negative for dysuria.  Musculoskeletal:  Negative for back pain.  Skin: Negative.  Negative for rash.  Neurological: Negative.  Negative for dizziness, focal weakness, weakness and headaches.  Psychiatric/Behavioral: Negative.  The patient is not nervous/anxious.    As per HPI. Otherwise, a complete review of systems is negative.  PAST MEDICAL HISTORY: Past Medical History:  Diagnosis Date   Cancer ([place])    cervical   Depression    Non Hodgkin's lymphoma ([place])     PAST SURGICAL HISTORY: Past Surgical History:  Procedure Laterality Date   ABDOMINAL HYSTERECTOMY     CESAREAN SECTION      CHOLECYSTECTOMY     IR PERC PLEURAL DRAIN W/INDWELL CATH W/IMG GUIDE  11/11/2020   IR REMOVAL OF PLURAL CATH W/CUFF  12/13/2020   PORTA CATH INSERTION N/A 11/18/2020   Procedure: PORTA CATH INSERTION;  Surgeon: Algernon Huxley, MD;  Location: [place] [facility];  Service: Cardiovascular;  Laterality: N/A;    FAMILY HISTORY: Family History  Problem Relation Age of Onset   Breast cancer Neg Hx    Ovarian cancer Neg Hx    Colon cancer Neg Hx    Diabetes Neg Hx     ADVANCED DIRECTIVES (Y/N):  N  HEALTH MAINTENANCE: Social History   Tobacco Use   Smoking status: Never   Smokeless tobacco: Never  Substance Use Topics   Alcohol use: Never   Drug use: Never     Colonoscopy:  PAP:  Bone density:  Lipid panel:  No Known Allergies  Current Outpatient Medications  Medication Sig Dispense Refill   allopurinol (ZYLOPRIM) 300 MG tablet Take 1 tablet (300 mg total) by mouth daily. 30 tablet 3   sucralfate (CARAFATE) 1 g tablet Take 1 tablet (1 g total) by mouth 3 (three) times daily. Dissolve tablet into 3 to 4 tablespoons of water, then swish and swallow. 90 tablet 0   apixaban (ELIQUIS) 5 MG TABS tablet Take 1 tablet (5 mg total) by mouth 2 (two) times daily. (Patient not taking: Reported on 04/24/2021) 60 tablet 2   folic acid (FOLVITE) 1 MG tablet Take 1 mg by mouth daily. (Patient not taking: Reported on 04/24/2021)     No current facility-administered medications for this visit.  OBJECTIVE: Vitals:   04/24/21 1403  BP: 118/81  Pulse: (!) 111  Resp: 20  Temp: 98.1 F (36.7 C)  SpO2: 99%     Body mass index is 38.87 kg/m.    ECOG FS:0 - Asymptomatic  General: Well-developed, well-nourished, no acute distress. Eyes: Pink conjunctiva, anicteric sclera. HEENT: Normocephalic, moist mucous membranes. Lungs: No audible wheezing or coughing. Heart: Regular rate and rhythm. Abdomen: Soft, nontender, no obvious distention. Musculoskeletal: No edema, cyanosis, or clubbing. Neuro:  Alert, answering all questions appropriately. Cranial nerves grossly intact. Skin: No rashes or petechiae noted. Psych: Normal affect.   LAB RESULTS:  Lab Results  Component Value Date   NA 137 04/24/2021   K 4.1 04/24/2021   CL 104 04/24/2021   CO2 27 04/24/2021   GLUCOSE 90 04/24/2021   BUN 19 04/24/2021   CREATININE 0.48 04/24/2021   CALCIUM 8.9 04/24/2021   PROT 7.0 04/24/2021   ALBUMIN 4.0 04/24/2021   AST 35 04/24/2021   ALT 31 04/24/2021   ALKPHOS 93 04/24/2021   BILITOT 0.6 04/24/2021   GFRNONAA >60 04/24/2021    Lab Results  Component Value Date   WBC 4.6 04/24/2021   NEUTROABS 2.6 04/24/2021   HGB 11.7 (L) 04/24/2021   HCT 35.9 (L) 04/24/2021   MCV 97.0 04/24/2021   PLT 217 04/24/2021     STUDIES: ECHOCARDIOGRAM COMPLETE  Result Date: 03/28/2021    ECHOCARDIOGRAM REPORT   Patient Name:   Jennifer Cole Date of Exam: 03/28/2021 Medical Rec #:  [id number]        Height:       66.0 in Accession #:    [id number]       Weight:       234.9 lb Date of Birth:  [birth year]        BSA:          2.141 m Patient Age:    71 years         BP:           118/84 mmHg Patient Gender: F                HR:           118 bpm. Exam Location:  [facility] Procedure: 2D Echo, Color Doppler, Cardiac Doppler and Strain Analysis Indications:     Z09 Chemo  History:         Patient has prior history of Echocardiogram examinations, most                  recent 11/19/2020. Mediastinal large B-cell lymphoma.  Sonographer:     Charmayne Sheer RDCS (AE) Referring Phys:  [place] Diagnosing Phys: Ida Rogue MD  Sonographer Comments: Global longitudinal strain was attempted. IMPRESSIONS  1. Left ventricular ejection fraction, by estimation, is 60 to 65%. The left ventricle has normal function. The left ventricle has no regional wall motion abnormalities. Left ventricular diastolic parameters are indeterminate. The average left ventricular global longitudinal strain is -17.8 %. The global  longitudinal strain is normal.  2. Right ventricular systolic function is normal. The right ventricular size is normal.  3. The mitral valve is normal in structure. Mild mitral valve regurgitation. No evidence of mitral stenosis. FINDINGS  Left Ventricle: Left ventricular ejection fraction, by estimation, is 60 to 65%. The left ventricle has normal function. The left ventricle has no regional wall motion abnormalities. The average left ventricular global longitudinal strain is -17.8 %. The global longitudinal strain  is normal. The left ventricular internal cavity size was normal in size. There is no left ventricular hypertrophy. Left ventricular diastolic parameters are indeterminate. Right Ventricle: The right ventricular size is normal. No increase in right ventricular wall thickness. Right ventricular systolic function is normal. Left Atrium: Left atrial size was normal in size. Right Atrium: Right atrial size was normal in size. Pericardium: There is no evidence of pericardial effusion. Mitral Valve: The mitral valve is normal in structure. Mild mitral valve regurgitation. No evidence of mitral valve stenosis. MV peak gradient, 2.3 mmHg. The mean mitral valve gradient is 1.0 mmHg. Tricuspid Valve: The tricuspid valve is normal in structure. Tricuspid valve regurgitation is not demonstrated. No evidence of tricuspid stenosis. Aortic Valve: The aortic valve is normal in structure. Aortic valve regurgitation is not visualized. No aortic stenosis is present. Aortic valve mean gradient measures 3.0 mmHg. Aortic valve peak gradient measures 5.3 mmHg. Aortic valve area, by VTI measures 5.57 cm. Pulmonic Valve: The pulmonic valve was normal in structure. Pulmonic valve regurgitation is not visualized. No evidence of pulmonic stenosis. Aorta: The aortic root is normal in size and structure. Venous: The inferior vena cava is normal in size with greater than 50% respiratory variability, suggesting right atrial pressure of  3 mmHg. IAS/Shunts: No atrial level shunt detected by color flow Doppler.  LEFT VENTRICLE PLAX 2D LVIDd:         4.00 cm  Diastology LVIDs:         2.20 cm  LV e' medial:    9.14 cm/s LV PW:         1.10 cm  LV E/e' medial:  5.4 LV IVS:        1.10 cm  LV e' lateral:   7.51 cm/s LVOT diam:     2.70 cm  LV E/e' lateral: 6.6 LV SV:         101 LV SV Index:   47       2D Longitudinal Strain LVOT Area:     5.73 cm 2D Strain GLS Avg:     -17.8 %  RIGHT VENTRICLE RV Basal diam:  2.70 cm LEFT ATRIUM           Index       RIGHT ATRIUM           Index LA diam:      4.00 cm 1.87 cm/m  RA Area:     12.50 cm LA Vol (A2C): 15.5 ml 7.24 ml/m  RA Volume:   28.70 ml  13.40 ml/m LA Vol (A4C): 26.2 ml 12.24 ml/m  AORTIC VALVE                   PULMONIC VALVE AV Area (Vmax):    5.38 cm    PV Vmax:       1.07 m/s AV Area (Vmean):   5.09 cm    PV Vmean:      69.200 cm/s AV Area (VTI):     5.57 cm    PV VTI:        0.163 m AV Vmax:           115.00 cm/s PV Peak grad:  4.6 mmHg AV Vmean:          82.600 cm/s PV Mean grad:  2.0 mmHg AV VTI:            0.182 m AV Peak Grad:      5.3 mmHg AV Mean Grad:  3.0 mmHg LVOT Vmax:         108.00 cm/s LVOT Vmean:        73.400 cm/s LVOT VTI:          0.177 m LVOT/AV VTI ratio: 0.97  AORTA Ao Root diam: 3.40 cm MITRAL VALVE MV Area (PHT): 9.14 cm    SHUNTS MV Area VTI:   8.38 cm    Systemic VTI:  0.18 m MV Peak grad:  2.3 mmHg    Systemic Diam: 2.70 cm MV Mean grad:  1.0 mmHg MV Vmax:       0.75 m/s MV Vmean:      50.4 cm/s MV Decel Time: 83 msec MV E velocity: 49.30 cm/s MV A velocity: 61.70 cm/s MV E/A ratio:  0.80 Ida Rogue MD Electronically signed by Ida Rogue MD Signature Date/Time: 03/28/2021/4:12:46 PM    Final      ASSESSMENT: 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   PLAN:    1.  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  Bone marrow biopsy is negative for lymphoma.  Cardiac echo completed on March 28, 2021 was unchanged with an EF of 60 to 65%.  PET scan results Feb 07, 2021 reviewed independently with significant interval response to therapy.  Patient completed cycle 6 of Truxima plus CHOP on April 11, 2021.  Continue adjuvant daily XRT completing treatment on April 30, 2021.  Patient has a CT scheduled for 19th 2022.  Return to clinic in 3 months with repeat PET scan and further evaluation.   2.  Cough/flank pain: Possibly secondary to XRT.  Repeat imaging as above.  3.  Right arm DVT: Continue Eliquis as prescribed at least through the duration of her treatments.  Will send a referral to remove port if CT scan on August 19th is improved. 4.  Anemia: Chronic and unchanged.  Patient's hemoglobin is 11.7.  Patient expressed understanding and was in agreement with this plan. She also understands that She can call clinic at any time with any questions, concerns, or complaints.   Cancer Staging Mediastinal (thymic) large B-cell lymphoma ([place]) Staging form: Hodgkin and Non-Hodgkin Lymphoma, AJCC 8th Edition - Clinical stage from 11/13/2020: Stage II bulky (Diffuse large B-cell lymphoma) - Signed by Lloyd Huger, MD on 11/13/2020   Lloyd Huger, MD   04/24/2021 5:33 PM

## 2021-04-21 ENCOUNTER — Inpatient Hospital Stay: Payer: 59

## 2021-04-21 ENCOUNTER — Ambulatory Visit
Admission: RE | Admit: 2021-04-21 | Discharge: 2021-04-21 | Disposition: A | Discharge status: Home / Self Care | Payer: 59 | Source: Ambulatory Visit | Attending: Radiation Oncology | Admitting: Radiation Oncology

## 2021-04-21 DIAGNOSIS — Z510 Encounter for antineoplastic radiation therapy: Secondary | ICD-10-CM | POA: Diagnosis not present

## 2021-04-22 ENCOUNTER — Ambulatory Visit
Admission: RE | Admit: 2021-04-22 | Discharge: 2021-04-22 | Disposition: A | Discharge status: Home / Self Care | Payer: 59 | Source: Ambulatory Visit | Attending: Radiation Oncology | Admitting: Radiation Oncology

## 2021-04-22 ENCOUNTER — Inpatient Hospital Stay: Payer: 59

## 2021-04-22 ENCOUNTER — Other Ambulatory Visit: Payer: Self-pay | Admitting: Licensed Clinical Social Worker

## 2021-04-22 ENCOUNTER — Ambulatory Visit (HOSPITAL_COMMUNITY): Payer: Self-pay | Admitting: Licensed Clinical Social Worker

## 2021-04-22 DIAGNOSIS — Z510 Encounter for antineoplastic radiation therapy: Secondary | ICD-10-CM | POA: Diagnosis not present

## 2021-04-22 DIAGNOSIS — C8520 Mediastinal (thymic) large B-cell lymphoma, unspecified site: Secondary | ICD-10-CM

## 2021-04-23 ENCOUNTER — Ambulatory Visit
Admission: RE | Admit: 2021-04-23 | Discharge: 2021-04-23 | Disposition: A | Discharge status: Home / Self Care | Payer: 59 | Source: Ambulatory Visit | Attending: Radiation Oncology | Admitting: Radiation Oncology

## 2021-04-23 ENCOUNTER — Inpatient Hospital Stay: Payer: 59

## 2021-04-23 DIAGNOSIS — Z510 Encounter for antineoplastic radiation therapy: Secondary | ICD-10-CM | POA: Diagnosis not present

## 2021-04-24 ENCOUNTER — Inpatient Hospital Stay: Payer: 59

## 2021-04-24 ENCOUNTER — Inpatient Hospital Stay (HOSPITAL_BASED_OUTPATIENT_CLINIC_OR_DEPARTMENT_OTHER): Payer: 59 | Admitting: Oncology

## 2021-04-24 ENCOUNTER — Encounter: Payer: Self-pay | Admitting: Oncology

## 2021-04-24 ENCOUNTER — Ambulatory Visit
Admission: RE | Admit: 2021-04-24 | Discharge: 2021-04-24 | Disposition: A | Discharge status: Home / Self Care | Payer: 59 | Source: Ambulatory Visit | Attending: Radiation Oncology | Admitting: Radiation Oncology

## 2021-04-24 ENCOUNTER — Other Ambulatory Visit: Payer: Self-pay

## 2021-04-24 VITALS — BP 118/81 | HR 111 | Temp 98.1°F | Resp 20 | Wt 240.8 lb

## 2021-04-24 DIAGNOSIS — Z95828 Presence of other vascular implants and grafts: Secondary | ICD-10-CM

## 2021-04-24 DIAGNOSIS — C8520 Mediastinal (thymic) large B-cell lymphoma, unspecified site: Secondary | ICD-10-CM

## 2021-04-24 DIAGNOSIS — Z510 Encounter for antineoplastic radiation therapy: Secondary | ICD-10-CM | POA: Diagnosis not present

## 2021-04-24 LAB — COMPREHENSIVE METABOLIC PANEL
ALT: 31 U/L (ref 0–44)
AST: 35 U/L (ref 15–41)
Albumin: 4 g/dL (ref 3.5–5.0)
Alkaline Phosphatase: 93 U/L (ref 38–126)
Anion gap: 6 (ref 5–15)
BUN: 19 mg/dL (ref 6–20)
CO2: 27 mmol/L (ref 22–32)
Calcium: 8.9 mg/dL (ref 8.9–10.3)
Chloride: 104 mmol/L (ref 98–111)
Creatinine, Ser: 0.48 mg/dL (ref 0.44–1.00)
GFR, Estimated: >60 mL/min (ref >60)
Glucose, Bld: 90 mg/dL (ref 70–99)
Potassium: 4.1 mmol/L (ref 3.5–5.1)
Sodium: 137 mmol/L (ref 135–145)
Total Bilirubin: 0.6 mg/dL (ref 0.3–1.2)
Total Protein: 7 g/dL (ref 6.5–8.1)

## 2021-04-24 LAB — CBC WITH DIFFERENTIAL/PLATELET
Abs Immature Granulocytes: 0.01 10*3/uL (ref 0.00–0.07)
Basophils Absolute: 0.1 10*3/uL (ref 0.0–0.1)
Basophils Relative: 1 %
Eosinophils Absolute: 0.4 10*3/uL (ref 0.0–0.5)
Eosinophils Relative: 9 %
HCT: 35.9 % — ABNORMAL LOW (ref 36.0–46.0)
Hemoglobin: 11.7 g/dL — ABNORMAL LOW (ref 12.0–15.0)
Immature Granulocytes: 0 %
Lymphocytes Relative: 19 %
Lymphs Abs: 0.9 10*3/uL (ref 0.7–4.0)
MCH: 31.6 pg (ref 26.0–34.0)
MCHC: 32.6 g/dL (ref 30.0–36.0)
MCV: 97 fL (ref 80.0–100.0)
Monocytes Absolute: 0.7 10*3/uL (ref 0.1–1.0)
Monocytes Relative: 15 %
Neutro Abs: 2.6 10*3/uL (ref 1.7–7.7)
Neutrophils Relative %: 56 %
Platelets: 217 10*3/uL (ref 150–400)
RBC: 3.7 MIL/uL — ABNORMAL LOW (ref 3.87–5.11)
RDW: 13.2 % (ref 11.5–15.5)
WBC: 4.6 10*3/uL (ref 4.0–10.5)
nRBC: 0 % (ref 0.0–0.2)

## 2021-04-24 MED ORDER — HEPARIN SOD (PORK) LOCK FLUSH 100 UNIT/ML IV SOLN
500.0000 [IU] | Freq: Once | INTRAVENOUS | Status: AC
  Administered 2021-04-24: 500 [IU] via INTRAVENOUS
  Filled 2021-04-24: qty 5

## 2021-04-24 MED ORDER — HEPARIN SOD (PORK) LOCK FLUSH 100 UNIT/ML IV SOLN
INTRAVENOUS | Status: AC
  Filled 2021-04-24: qty 5

## 2021-04-24 MED ORDER — SODIUM CHLORIDE 0.9% FLUSH
10.0000 mL | INTRAVENOUS | Status: DC | PRN
  Administered 2021-04-24: 10 mL via INTRAVENOUS
  Filled 2021-04-24: qty 10

## 2021-04-25 ENCOUNTER — Other Ambulatory Visit: Payer: Self-pay

## 2021-04-25 ENCOUNTER — Inpatient Hospital Stay: Payer: 59

## 2021-04-25 ENCOUNTER — Ambulatory Visit
Admission: RE | Admit: 2021-04-25 | Discharge: 2021-04-25 | Disposition: A | Discharge status: Home / Self Care | Payer: 59 | Source: Ambulatory Visit | Attending: Radiation Oncology | Admitting: Radiation Oncology

## 2021-04-25 DIAGNOSIS — Z510 Encounter for antineoplastic radiation therapy: Secondary | ICD-10-CM | POA: Diagnosis not present

## 2021-04-28 ENCOUNTER — Ambulatory Visit
Admission: RE | Admit: 2021-04-28 | Discharge: 2021-04-28 | Disposition: A | Discharge status: Home / Self Care | Payer: 59 | Source: Ambulatory Visit | Attending: Radiation Oncology | Admitting: Radiation Oncology

## 2021-04-28 ENCOUNTER — Inpatient Hospital Stay: Payer: 59

## 2021-04-28 DIAGNOSIS — Z510 Encounter for antineoplastic radiation therapy: Secondary | ICD-10-CM | POA: Diagnosis not present

## 2021-04-29 ENCOUNTER — Ambulatory Visit
Admission: RE | Admit: 2021-04-29 | Discharge: 2021-04-29 | Disposition: A | Discharge status: Home / Self Care | Payer: 59 | Source: Ambulatory Visit | Attending: Radiation Oncology | Admitting: Radiation Oncology

## 2021-04-29 ENCOUNTER — Ambulatory Visit: Payer: 59

## 2021-04-29 ENCOUNTER — Inpatient Hospital Stay: Payer: 59

## 2021-04-29 DIAGNOSIS — Z510 Encounter for antineoplastic radiation therapy: Secondary | ICD-10-CM | POA: Diagnosis not present

## 2021-04-30 ENCOUNTER — Ambulatory Visit
Admission: RE | Admit: 2021-04-30 | Discharge: 2021-04-30 | Disposition: A | Discharge status: Home / Self Care | Payer: 59 | Source: Ambulatory Visit | Attending: Radiation Oncology | Admitting: Radiation Oncology

## 2021-04-30 DIAGNOSIS — Z510 Encounter for antineoplastic radiation therapy: Secondary | ICD-10-CM | POA: Diagnosis not present

## 2021-05-02 ENCOUNTER — Ambulatory Visit: Payer: 59

## 2021-05-05 ENCOUNTER — Encounter (HOSPITAL_COMMUNITY): Payer: 59 | Admitting: Psychiatry

## 2021-05-16 ENCOUNTER — Ambulatory Visit: Payer: 59 | Attending: Radiation Oncology

## 2021-05-29 ENCOUNTER — Telehealth: Payer: Self-pay | Admitting: *Deleted

## 2021-05-29 NOTE — Telephone Encounter (Signed)
Pt made aware that it is okay to stop medication at this time per Dr. Grayland Ormond. Medication discontinued and removed from med list.

## 2021-05-29 NOTE — Telephone Encounter (Signed)
Pt asking if needs to continue taking allopurinol. Please advise.

## 2021-06-06 ENCOUNTER — Other Ambulatory Visit: Payer: Self-pay

## 2021-06-06 ENCOUNTER — Inpatient Hospital Stay: Payer: 59 | Attending: Radiation Oncology

## 2021-06-06 ENCOUNTER — Ambulatory Visit
Admission: RE | Admit: 2021-06-06 | Discharge: 2021-06-06 | Disposition: A | Discharge status: Home / Self Care | Payer: 59 | Source: Ambulatory Visit | Attending: Radiation Oncology | Admitting: Radiation Oncology

## 2021-06-06 ENCOUNTER — Inpatient Hospital Stay: Payer: 59

## 2021-06-06 ENCOUNTER — Other Ambulatory Visit: Payer: Self-pay | Admitting: Oncology

## 2021-06-06 VITALS — BP 119/82 | HR 100 | Temp 97.2°F | Wt 231.0 lb

## 2021-06-06 DIAGNOSIS — C8520 Mediastinal (thymic) large B-cell lymphoma, unspecified site: Secondary | ICD-10-CM

## 2021-06-06 DIAGNOSIS — Z923 Personal history of irradiation: Secondary | ICD-10-CM | POA: Insufficient documentation

## 2021-06-06 LAB — CBC WITH DIFFERENTIAL/PLATELET
Abs Immature Granulocytes: 0.01 10*3/uL (ref 0.00–0.07)
Basophils Absolute: 0 10*3/uL (ref 0.0–0.1)
Basophils Relative: 1 %
Eosinophils Absolute: 0.2 10*3/uL (ref 0.0–0.5)
Eosinophils Relative: 4 %
HCT: 40.2 % (ref 36.0–46.0)
Hemoglobin: 13.6 g/dL (ref 12.0–15.0)
Immature Granulocytes: 0 %
Lymphocytes Relative: 18 %
Lymphs Abs: 0.9 10*3/uL (ref 0.7–4.0)
MCH: 30.6 pg (ref 26.0–34.0)
MCHC: 33.8 g/dL (ref 30.0–36.0)
MCV: 90.3 fL (ref 80.0–100.0)
Monocytes Absolute: 0.5 10*3/uL (ref 0.1–1.0)
Monocytes Relative: 10 %
Neutro Abs: 3.3 10*3/uL (ref 1.7–7.7)
Neutrophils Relative %: 67 %
Platelets: 236 10*3/uL (ref 150–400)
RBC: 4.45 MIL/uL (ref 3.87–5.11)
RDW: 12.6 % (ref 11.5–15.5)
WBC: 5 10*3/uL (ref 4.0–10.5)
nRBC: 0 % (ref 0.0–0.2)

## 2021-06-06 LAB — COMPREHENSIVE METABOLIC PANEL
ALT: 31 U/L (ref 0–44)
AST: 32 U/L (ref 15–41)
Albumin: 4.5 g/dL (ref 3.5–5.0)
Alkaline Phosphatase: 80 U/L (ref 38–126)
Anion gap: 9 (ref 5–15)
BUN: 16 mg/dL (ref 6–20)
CO2: 27 mmol/L (ref 22–32)
Calcium: 9.5 mg/dL (ref 8.9–10.3)
Chloride: 103 mmol/L (ref 98–111)
Creatinine, Ser: 0.58 mg/dL (ref 0.44–1.00)
GFR, Estimated: >60 mL/min (ref >60)
Glucose, Bld: 108 mg/dL — ABNORMAL HIGH (ref 70–99)
Potassium: 4.2 mmol/L (ref 3.5–5.1)
Sodium: 139 mmol/L (ref 135–145)
Total Bilirubin: 1 mg/dL (ref 0.3–1.2)
Total Protein: 7.5 g/dL (ref 6.5–8.1)

## 2021-06-06 MED ORDER — HEPARIN SOD (PORK) LOCK FLUSH 100 UNIT/ML IV SOLN
500.0000 [IU] | Freq: Once | INTRAVENOUS | Status: AC
  Filled 2021-06-06: qty 5

## 2021-06-06 MED ORDER — HEPARIN SOD (PORK) LOCK FLUSH 100 UNIT/ML IV SOLN
INTRAVENOUS | Status: AC
  Administered 2021-06-06: 500 [IU] via INTRAVENOUS
  Filled 2021-06-06: qty 5

## 2021-06-06 MED ORDER — SODIUM CHLORIDE 0.9% FLUSH
10.0000 mL | INTRAVENOUS | Status: DC | PRN
  Administered 2021-06-06: 10 mL via INTRAVENOUS
  Filled 2021-06-06: qty 10

## 2021-06-06 MED FILL — Apixaban Tab 5 MG: ORAL | 30 days supply | Qty: 60 | Fill #0 | Status: AC

## 2021-06-06 NOTE — Progress Notes (Signed)
Radiation Oncology Follow up Note  Name: Jennifer Cole   Date:   06/06/2021 MRN:  [id number] DOB: 1966-02-03    This 55 y.o. female presents to the clinic today for 1 month follow-up status post involved field radiation therapy for mediastinal large B-cell lymphoma status post chemotherapy with excellent response.  REFERRING PROVIDER: No ref. provider found  HPI: Patient is a 55 year old female now at 1 month having completed involved field radiation therapy to her mediastinum for a large mediastinal B-cell lymphoma status postchemotherapy with an excellent response seen today in routine follow-up she is doing well.  She specifically denies cough hemoptysis chest tightness.  She is also having no fever chills or night sweats.  Her major complaint is some discomfort associated with her Port-A-Cath..  COMPLICATIONS OF TREATMENT: none  FOLLOW UP COMPLIANCE: keeps appointments   PHYSICAL EXAM:  BP 119/82   Pulse 100   Temp (!) 97.2 F (36.2 C) (Tympanic)   Wt 231 lb (104.8 kg)   BMI 37.28 kg/m  Well-developed well-nourished patient in NAD. HEENT reveals PERLA, EOMI, discs not visualized.  Oral cavity is clear. No oral mucosal lesions are identified. Neck is clear without evidence of cervical or supraclavicular adenopathy. Lungs are clear to A&P. Cardiac examination is essentially unremarkable with regular rate and rhythm without murmur rub or thrill. Abdomen is benign with no organomegaly or masses noted. Motor sensory and DTR levels are equal and symmetric in the upper and lower extremities. Cranial nerves II through XII are grossly intact. Proprioception is intact. No peripheral adenopathy or edema is identified. No motor or sensory levels are noted. Crude visual fields are within normal range.  RADIOLOGY RESULTS: No current films for review  PLAN: Present time patient is doing well very low side effect profile from involved field radiation therapy.  And pleased with her overall  progress.  She continues close follow-up care with medical oncology.  I assume we will repeat her PET CT scan in several months.  I have asked to see her back in 4 to 5 months for follow-up.  Patient knows to call with any concerns.  I would like to take this opportunity to thank you for allowing me to participate in the care of your patient.Noreene Filbert, MD

## 2021-06-19 ENCOUNTER — Other Ambulatory Visit: Payer: Self-pay

## 2021-06-19 ENCOUNTER — Other Ambulatory Visit: Payer: Self-pay | Admitting: *Deleted

## 2021-06-19 DIAGNOSIS — C8520 Mediastinal (thymic) large B-cell lymphoma, unspecified site: Secondary | ICD-10-CM

## 2021-06-19 MED ORDER — LIDOCAINE-PRILOCAINE 2.5-2.5 % EX CREA: TOPICAL_CREAM | CUTANEOUS | 3 refills | Status: DC

## 2021-06-30 NOTE — Progress Notes (Signed)
This encounter was created in error - please disregard.

## 2021-07-02 ENCOUNTER — Other Ambulatory Visit: Payer: Self-pay

## 2021-07-02 ENCOUNTER — Ambulatory Visit
Admission: RE | Admit: 2021-07-02 | Discharge: 2021-07-02 | Disposition: A | Discharge status: Home / Self Care | Payer: 59 | Source: Ambulatory Visit | Attending: Radiation Oncology | Admitting: Radiation Oncology

## 2021-07-02 DIAGNOSIS — C8520 Mediastinal (thymic) large B-cell lymphoma, unspecified site: Secondary | ICD-10-CM | POA: Diagnosis not present

## 2021-07-02 MED ORDER — IOHEXOL 350 MG/ML SOLN
75.0000 mL | Freq: Once | INTRAVENOUS | Status: AC | PRN
  Administered 2021-07-02: 75 mL via INTRAVENOUS

## 2021-07-08 ENCOUNTER — Telehealth: Payer: Self-pay | Admitting: *Deleted

## 2021-07-08 ENCOUNTER — Telehealth: Payer: Self-pay | Admitting: Oncology

## 2021-07-08 NOTE — Telephone Encounter (Signed)
Call returned to [facility] inquiring who had requested this refill. I was told that it came in on behalf of the patient who last had it refilled 04/11/21. I advised that we will not refill this medicine as she has been referred to psychiatry and is no longer being followed by Palliative care, she needs to contact her psychiatrist for this refill. She states she will inform patient of this

## 2021-07-08 NOTE — Telephone Encounter (Signed)
Incoming fax from [facility] asking for refill of this patients Mirtazapine which has not been refilled since 02/2021 and has been discontinued from her medicine list. Please advise

## 2021-07-09 ENCOUNTER — Encounter: Payer: Self-pay | Admitting: Oncology

## 2021-07-09 NOTE — Telephone Encounter (Signed)
error 

## 2021-07-12 ENCOUNTER — Encounter: Payer: Self-pay | Admitting: Oncology

## 2021-07-12 ENCOUNTER — Ambulatory Visit (HOSPITAL_COMMUNITY)
Admission: EM | Admit: 2021-07-12 | Discharge: 2021-07-12 | Disposition: A | Discharge status: Home / Self Care | Payer: 59 | Attending: Physician Assistant | Admitting: Physician Assistant

## 2021-07-12 DIAGNOSIS — F330 Major depressive disorder, recurrent, mild: Secondary | ICD-10-CM

## 2021-07-12 DIAGNOSIS — C8520 Mediastinal (thymic) large B-cell lymphoma, unspecified site: Secondary | ICD-10-CM

## 2021-07-12 MED ORDER — APIXABAN 5 MG PO TABS: 5.0000 mg | ORAL_TABLET | Freq: Once | ORAL | Status: DC

## 2021-07-12 MED ORDER — APIXABAN 5 MG PO TABS
5.0000 mg | ORAL_TABLET | Freq: Once | ORAL | Status: DC
  Administered 2021-07-12: 5 mg via ORAL
  Filled 2021-07-12: qty 1

## 2021-07-12 NOTE — ED Provider Notes (Signed)
Behavioral Health Urgent Care Medical Screening Exam  Patient Name: Jennifer Cole MRN: [id number] Date of Evaluation: 07/12/21 Chief Complaint:   Diagnosis:  Final diagnoses:  Mild episode of recurrent major depressive disorder ([place])    History of Present illness: Jennifer Cole is a 55 y.o. female with a past history significant for major depressive disorder who presents to the [facility] due to passive suicidal ideation.  Patient attributed her passive suicidal ideation to recent events surrounding her relationship with her boyfriend.  She states that her boyfriend left her for another woman and that the other woman is possibly pregnant.  Patient reports that when her boyfriend has been in the relationship with the other woman, the other woman moved in with him shortly.  Patient is reports that at one point the other woman had ended the relationship with the patient's boyfriend due to patient constantly calling.  The incident that ignited the patient's passive suicidal ideations occurred when the patient went to go meet her boyfriend shortly after she got off work. During the encounter, the patient's boyfriend informed her that he was not sure if the other woman was pregnant, however, he was open to the idea of being with the other woman because he doesn't have any kids and has always wanted them. Her boyfriend expressed to her that he deserves happiness and that it would be selfish to deprive him of happiness.  In response to the boyfriend possibly ending the relationship, patient stated that she was going to kill herself. Patient expressed to provider that she did not mean anything by the threat on er life and only said in a fit of rage.  Patient endorses her daughter and son as well as grandchildren as a protective factor from harming herself.  Patient also denies a past hospitalization due to mental health nor has she ever attempted suicide in the past.  Patient  denies engaging in any self-injurious behavior at a time.  Patient endorses depressive symptoms stemming from the past few days. Patient denies anxiety and states that she has energy and is able to work.  Patient is not currently on any psychiatric medication but has been on an antidepressant in the past.  Patient does not remember the name of the antidepressant she has been on in the past.  Patient was recently seen at [facility] by a therapist but only attended one session due to not connecting with the therapist.  Patient is agreeable to reestablishing care at [facility].  Patient to be provided walk-in hours for medication management at [facility].  Patient is alert and oriented x4, tearful at times but is able to answer all questions addressed to her.  Patient denies active suicidal or homicidal ideations.  She further denies auditory or visual hallucinations and does not appear to be responding to internal/external stimuli.  Patient endorses fair sleep and receives on average 4 hours of sleep each night.  Patient endorses decreased appetite and eats on average 1 meal per day.  Patient denies tobacco use, alcohol consumption, and illicit drug use.  Psychiatric Specialty Exam  Presentation  General Appearance:Appropriate for Environment  Eye Contact:Good  Speech:Clear and Coherent; Normal Rate  Speech Volume:Normal  Handedness:Right   Mood and Affect  Mood:Depressed  Affect:Congruent; Tearful   Thought Process  Thought Processes:Coherent; Goal Directed  Descriptions of Associations:Intact  Orientation:Full (Time, Place and Person)  Thought Content:WDL  Diagnosis of Schizophrenia or Schizoaffective disorder in past: No   Hallucinations:None  Ideas of Reference:None  Suicidal Thoughts:No  Homicidal Thoughts:No   Sensorium  Memory:Immediate Good; Recent Good; Remote Good  Judgment:Fair  Insight:Fair   Executive Functions   Concentration:Good  Attention Span:Good  [place]  Language:Good   Psychomotor Activity  Psychomotor Activity:Normal   Assets  Assets:Communication Skills; Desire for Improvement; Housing; Social Support   Sleep  Sleep:Fair  Number of hours: 4   No data recorded  Physical Exam: Physical Exam Psychiatric:        Attention and Perception: Attention and perception normal. She does not perceive auditory or visual hallucinations.        Mood and Affect: Mood is depressed. Affect is tearful.        Speech: Speech normal.        Behavior: Behavior normal. Behavior is cooperative.        Thought Content: Thought content normal. Thought content is not paranoid. Thought content does not include homicidal or suicidal ideation. Thought content does not include suicidal plan.        Cognition and Memory: Cognition and memory normal.        Judgment: Judgment normal.   Review of Systems  Psychiatric/Behavioral:  Positive for depression. Negative for hallucinations, substance abuse and suicidal ideas. The patient is not nervous/anxious and does not have insomnia.   Blood pressure 118/82, pulse 98, temperature 98.2 F (36.8 C), temperature source Oral, resp. rate 18, SpO2 97 %. There is no height or weight on file to calculate BMI.  Musculoskeletal: Strength & Muscle Tone: within normal limits Gait & Station: normal Patient leans: N/A   [place] MSE Discharge Disposition for Follow up and Recommendations: Based on my evaluation the patient does not appear to have an emergency medical condition and can be discharged with resources and follow up care in outpatient services for Medication Management, Individual Therapy, and upon conclusion of the encounter patient to be provided information regarding therapy and outpatient services at [facility].  Patient denies being a danger to herself and is able to contract for  safety.   Malachy Mood, PA 07/12/2021, 2:58 PM

## 2021-07-12 NOTE — Progress Notes (Signed)
   07/12/21 1305  [place] (Walk-ins at [facility] only)  How Did You Hear About [place]? Legal System  What Is the Reason for Your Visit/Call Today? Patient presents via [employer] after he boyfriend called d/t safety concerns. Patient was with boyfriend when she began making suicidal statements, stating she was planning to kill herself by cutting herself with a knife.  Patient reports her primary stressor is her boyfriend has "left me for a 54 y.o. and got her pregnant."  She is upset that she is unable to "give him a child" as she had tubes tied.  She ruminates on not being able to "give him a child, that's all he wanted."  He told patient he would find out if the other woman is pregnant and asked patient last night to give him two days.  She has since struggled to cope with the idea of the other woman possibly being pregnant with his child.  She is tearful and hopeless, and is unable to affirm her safety.  Patient with past suicidal threat to cut herself in 2014.  She was taken to an ED and released without inpt admission, as she was able to affirm safety.  Patient recently saw Adam with [facility] for therapy, but felt the one session "wasn't helpful."  She has been prescribed an antidepressant (can't remember name of medicine) and has been off the medicine since she ran out two months ago.  Patient denies HI, AVH or SA hx. She is hesitatant to consider inpatient treatment as she has to work Monday night.  How Long Has This Been Causing You Problems? 1 wk - 1 month  Have You Recently Had Any Thoughts About Hurting Yourself? Yes  How long ago did you have thoughts about hurting yourself? Today  Are You Planning to Commit Suicide/Harm Yourself At This time? Yes  Have you Recently Had Thoughts About Hurting Someone [name]? No  Are You Planning To Harm Someone At This Time? No  Are you currently experiencing any auditory, visual or other hallucinations? No  Have You Used Any Alcohol or Drugs in the Past 24 Hours? No   Do you have any current medical co-morbidities that require immediate attention? No  Clinician description of patient physical appearance/behavior: Patient is tearful, despondent and hopeless.  What Do You Feel Would Help You the Most Today? Treatment for Depression or other mood problem  If access to [facility] was not available, would you have sought care in the Emergency Department? Yes  Determination of Need Urgent (48 hours)  Options For Referral Medication Management;Outpatient Therapy
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
## 2021-07-12 NOTE — ED Notes (Signed)
Discharge instructions provided and Pt stated understanding. Pt alert, orient and ambulatory prior to d/c from facility. Personal belongings returned from the blue locker. Pt escorted to the front lobby to d/c the facility. This nurse showed Pt where the elevators are located for her future appointments upstairs. Safety maintained.

## 2021-07-12 NOTE — Discharge Instructions (Addendum)
Patient provided contact information to [facility] to schedule follow up appointment. Patient was also made aware of walk-in hours medication management. Patient to continue diet and exercise as tolerated.

## 2021-07-14 ENCOUNTER — Inpatient Hospital Stay: Payer: 59

## 2021-07-14 ENCOUNTER — Inpatient Hospital Stay: Payer: 59 | Attending: Hospice and Palliative Medicine | Admitting: Hospice and Palliative Medicine

## 2021-07-14 ENCOUNTER — Other Ambulatory Visit: Payer: Self-pay

## 2021-07-14 VITALS — BP 127/83 | HR 96 | Temp 97.2°F

## 2021-07-14 DIAGNOSIS — Z23 Encounter for immunization: Secondary | ICD-10-CM

## 2021-07-14 DIAGNOSIS — C8520 Mediastinal (thymic) large B-cell lymphoma, unspecified site: Secondary | ICD-10-CM

## 2021-07-14 MED ORDER — INFLUENZA VAC A&B SA ADJ QUAD 0.5 ML IM PRSY: 0.5000 mL | PREFILLED_SYRINGE | Freq: Once | INTRAMUSCULAR | Status: DC

## 2021-07-14 MED ORDER — INFLUENZA VAC SPLIT QUAD 0.5 ML IM SUSY
0.5000 mL | PREFILLED_SYRINGE | Freq: Once | INTRAMUSCULAR | Status: AC
  Administered 2021-07-14: 0.5 mL via INTRAMUSCULAR

## 2021-07-14 NOTE — Progress Notes (Signed)
Pt unsure of reason for appointment today. She states that she has not had a pap smear, but did go to OBGYN a few months ago for vaginal pains. She reports that [facility] made the appointment for her. She has not had any follow-up with OBGYN since that visit. Denies any pain or issues today. Reports that she has felt depressed and went to psych facility over the weekend. No new meds were started, but reports that she is supposed to follow-up this week to start treatment.

## 2021-07-14 NOTE — Progress Notes (Signed)
Unclear why patient was scheduled for follow-up visit with me today.  She is on active surveillance of her cancer with plan for PET and medical oncology follow-up in 2 weeks.  She is currently asymptomatic and denies weight loss, pain, or B symptoms.  Patient is tearful today as she describes her recent break-up with her boyfriend.  She was seen over the weekend by psychiatry due to transient suicidal ideation.  Patient has scheduled follow-up tomorrow with psychiatry for medication management.  Patient adamantly denies any suicidal ideation today.  She does not have a plan and reports that she would notify her family if she felt suicidal.  She reports having good support from her daughter with whom patient lives.  Patient was given the number for the suicide hotline.  Emotional support given.  Case discussed with Dr. Grayland Ormond.  Patient to follow-up as previously scheduled for PET scan on 11/8 and then to see Dr. Grayland Ormond on 11/15.  No charge

## 2021-07-14 NOTE — Patient Instructions (Signed)
Suicidal Feelings: How to Help Yourself Suicide is when you end your own life. There are many things you can do to help yourself feel better when struggling with these feelings. Many services and people are available to support you and others who struggle with similar feelings.  If you ever feel like you may hurt yourself or others, or have thoughts about taking your own life, get help right away. To get help: Call your local emergency services (911 in the [place] [facility] [place] [address]'s health and human services helpline (211 in the [place] Go to your nearest emergency department. Call a suicide hotline to speak with a trained counselor. The following suicide hotlines are available in the [place]: 1-800-273-TALK 858-589-6656). 1-800-SUICIDE (208)320-7572). 440-530-3171. This is a hotline for Spanish speakers. 272-261-8611. This is a hotline for TTY users. 1-866-4-U-TREVOR 215-421-8740). This is a hotline for lesbian, gay, bisexual, transgender, or questioning youth. For a list of hotlines in [place], visit ParkingAffiliatePrograms.se.html Contact a crisis center or a local suicide prevention center. To find a crisis center or suicide prevention center: Call your local hospital, clinic, community service organization, mental health center, social service provider, or health department. Ask for help with connecting to a crisis center. For a list of crisis centers in the [place], visit: suicidepreventionlifeline.org For a list of crisis centers in [place], visit: suicideprevention.ca How to help yourself feel better  Promise yourself that you will not do anything extreme when you have suicidal feelings. Remember the times you have felt hopeful. Many people have gotten through suicidal thoughts and feelings, and you can too. If you have had these feelings before, remind yourself that you can get through them again. Let family, friends, teachers, or  counselors know how you are feeling. Try not to separate yourself from those who care about you and want to help you. Talk with someone every day, even if you do not feel sociable. Face-to-face conversation is best to help them understand your feelings. Contact a mental health care provider and work with this person regularly. Make a safety plan that you can follow during a crisis. Include phone numbers of suicide prevention hotlines, mental health professionals, and trusted friends and family members you can call during an emergency. Save these numbers on your phone. If you are thinking of taking a lot of medicine, give your medicine to someone who can give it to you as prescribed. If you are on antidepressants and are concerned you will overdose, tell your health care provider so that he or she can give you safer medicines. Try to stick to your routines and follow a schedule every day. Make self-care a priority. Make a list of realistic goals, and cross them off when you achieve them. Accomplishments can give you a sense of worth. Wait until you are feeling better before doing things that you find difficult or unpleasant. Do things that you have always enjoyed to take your mind off your feelings. Try reading a book, or listening to or playing music. Spending time outside, in nature, may help you feel better. Follow these instructions at home:  Visit your primary health care provider every year for a checkup. Work with a mental health care provider as needed. Eat a well-balanced diet, and eat regular meals. Get plenty of rest. Exercise if you are able. Just 30 minutes of exercise each day can help you feel better. Take over-the-counter and prescription medicines only as told by your health care provider. Ask your mental health care provider about  the possible side effects of any medicines you are taking. Do not use alcohol or drugs, and remove these substances from your home. Remove weapons,  poisons, knives, and other deadly items from your home. General recommendations Keep your living space well lit. When you are feeling well, write yourself a letter with tips and support that you can read when you are not feeling well. Remember that life's difficulties can be sorted out with help. Conditions can be treated, and you can learn behaviors and ways of thinking that will help you. Where to find more information National Suicide Prevention Lifeline: www.suicidepreventionlifeline.org Hopeline: www.hopeline.[place] for Suicide Prevention: PromotionalLoans.co.za [employer] (for lesbian, gay, bisexual, transgender, or questioning youth): www.thetrevorproject.[employer] of Mental Health: http://bridges.com/ Contact a health care provider if: You feel as though you are a burden to others. You feel agitated, angry, vengeful, or have extreme mood swings. You have withdrawn from family and friends. Get help right away if: You are talking about suicide or wishing to die. You start making plans for how to commit suicide. You feel that you have no reason to live. You start making plans for putting your affairs in order, saying goodbye, or giving your possessions away. You feel guilt, shame, or unbearable pain, and it seems like there is no way out. You are frequently using drugs or alcohol. You are engaging in risky behaviors that could lead to death. If you have any of these symptoms, get help right away. Call emergency services, go to your nearest emergency department or crisis center, or call a suicide crisis helpline. Summary Suicide is when you take your own life. Promise yourself that you will not do anything extreme when you have suicidal feelings. Let family, friends, teachers, or counselors know how you are feeling. Get help right away if you start making plans for how to commit suicide. This information is not  intended to replace advice given to you by your health care provider. Make sure you discuss any questions you have with your health care provider. Document Revised: 05/15/2020 Document Reviewed: 05/17/2020 Elsevier Patient Education  2022 [employer].

## 2021-07-15 ENCOUNTER — Telehealth (HOSPITAL_COMMUNITY): Payer: Self-pay

## 2021-07-15 ENCOUNTER — Ambulatory Visit (INDEPENDENT_AMBULATORY_CARE_PROVIDER_SITE_OTHER): Payer: 59 | Admitting: Psychiatry

## 2021-07-15 ENCOUNTER — Encounter (HOSPITAL_COMMUNITY): Payer: Self-pay | Admitting: Psychiatry

## 2021-07-15 DIAGNOSIS — F331 Major depressive disorder, recurrent, moderate: Secondary | ICD-10-CM | POA: Diagnosis not present

## 2021-07-15 DIAGNOSIS — F411 Generalized anxiety disorder: Secondary | ICD-10-CM | POA: Diagnosis not present

## 2021-07-15 MED ORDER — TRAZODONE HCL 50 MG PO TABS: 25.0000 mg | ORAL_TABLET | Freq: Every day | ORAL | 3 refills | Status: DC

## 2021-07-15 MED ORDER — FLUOXETINE HCL 10 MG PO CAPS: 10.0000 mg | ORAL_CAPSULE | Freq: Every day | ORAL | 3 refills | Status: DC

## 2021-07-15 NOTE — Progress Notes (Signed)
Psychiatric Initial Adult Assessment  Virtual Visit via Video Note  I connected with Jennifer Cole on 07/15/21 at  8:00 AM EDT by a video enabled telemedicine application and verified that I am speaking with the correct person using two identifiers.  Location: Patient: Home Provider: Clinic   I discussed the limitations of evaluation and management by telemedicine and the availability of in person appointments. The patient expressed understanding and agreed to proceed.  I provided 45 minutes of non-face-to-face time during this encounter.   Patient Identification: Jennifer Cole MRN:  [id number] Date of Evaluation:  07/15/2021 Referral Source: [place] Chief Complaint:  "I feel the same" Visit Diagnosis:    ICD-10-CM   1. Moderate episode of recurrent major depressive disorder (HCC)  F33.1 FLUoxetine (PROZAC) 10 MG capsule    traZODone (DESYREL) 50 MG tablet    Ambulatory referral to Social Work    2. Generalized anxiety disorder  F41.1 FLUoxetine (PROZAC) 10 MG capsule    traZODone (DESYREL) 50 MG tablet    Ambulatory referral to Social Work      History of Present Illness:  55 year old female seen today for initial psychiatric evaluation. She was referred to outpatient psychiatry by [facility] where she was seen on 10/29 for SI related to relationship issues.  Currently she is not managed on medications and notes that she has not been on medication for over 8 years. Patient unable to remember which medications she was on.   Today she is well groomed, pleasant, cooperative, and engaged in conversation. She informed Probation officer that she has been more depressed and anxious due to conflicts with her ex- boyfriend. She notes that he may have another women pregnant and recently told her he does not love her or want to be with her. 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 She notes that she has stress in her home as well. She reports that her ex  husband/children's father lives with here and notes that he too has cancer. Her two children and her three grandchildren also live in the home and she reports that she does not get peace. She informed Probation officer that she wishes she could go away and not be founds. She reports that she isolates herself from her family as well.  Patient notes that the above worsens her anxiety and depression. Today provider conducted a GAD 7 and patient scored a 12. Provider also conducted a PHQ 9 and patient scored a 19. She notes that her sleep and appetite continues to be decreased. She notes that she sleeps 4 hours nightly (notes she works second shift at [employer] eats once a day. She endorses losing 20 pounds over the last few months. Today she endorses passive SI but denies wanting to harm herself. She denies SI/HI/VAH, mania, or paranoia.  Patient notes that the death of her daughter a year ago was traumatic. She notes that she was 37 when she passed from cancer. She also notes that her recent breakup was traumatic. Today she denies nightmares, flashback, or avoidant behaviors.   Today she is agreeable to starting Prozac 10 mg to help manage anxiety and depression. She will also start Trazodone 25-50 mg nightly as needed for sleep. Potential side effects of medication and risks vs benefits of treatment vs non-treatment were explained and discussed. All questions were answered.She was referred to outpatient counseling for therapy. No other concerns noted at this time.     Associated Signs/Symptoms: Depression Symptoms:  depressed mood, insomnia, psychomotor agitation, fatigue, feelings of worthlessness/guilt, difficulty concentrating, hopelessness, suicidal thoughts without plan, anxiety, loss of energy/fatigue, weight loss, decreased appetite, (Hypo) Manic Symptoms:  Flight of Ideas, Irritable Mood, Anxiety Symptoms:  Excessive Worry, Psychotic Symptoms:   Denie PTSD Symptoms: NA  Past Psychiatric  History:  Anxiety, depression, and SI  Previous Psychotropic Medications: Yes   Substance Abuse History in the last 12 months:  No.  Consequences of Substance Abuse: NA  Past Medical History:  Past Medical History:  Diagnosis Date   Cancer ([place])    cervical   Depression    Non Hodgkin's lymphoma ([place])     Past Surgical History:  Procedure Laterality Date   ABDOMINAL HYSTERECTOMY     CESAREAN SECTION     CHOLECYSTECTOMY     IR PERC PLEURAL DRAIN W/INDWELL CATH W/IMG GUIDE  11/11/2020   IR REMOVAL OF PLURAL CATH W/CUFF  12/13/2020   PORTA CATH INSERTION N/A 11/18/2020   Procedure: PORTA CATH INSERTION;  Surgeon: Algernon Huxley, MD;  Location: [place] [facility];  Service: Cardiovascular;  Laterality: N/A;    Family Psychiatric History: Denies  Family History:  Family History  Problem Relation Age of Onset   Breast cancer Neg Hx    Ovarian cancer Neg Hx    Colon cancer Neg Hx    Diabetes Neg Hx     Social History:   Social History   Socioeconomic History   Marital status: Legally Separated    Spouse name: Not on file   Number of children: Not on file   Years of education: Not on file   Highest education level: Not on file  Occupational History   Not on file  Tobacco Use   Smoking status: Never   Smokeless tobacco: Never  Substance and Sexual Activity   Alcohol use: Never   Drug use: Never   Sexual activity: Yes    Birth control/protection: Surgical  Other Topics Concern   Not on file  Social History Narrative   Not on file   Social Determinants of Health   Financial Resource Strain: Low Risk    Difficulty of Paying Living Expenses: Not hard at all  Food Insecurity: No Food Insecurity   Worried About Charity fundraiser in the Last Year: Never true   [place] in the Last Year: Never true  Transportation Needs: No Transportation Needs   Lack of Transportation (Medical): No   Lack of Transportation (Non-Medical): No  Physical Activity: Inactive    Days of Exercise per Week: 0 days   Minutes of Exercise per Session: 0 min  Stress: Stress Concern Present   Feeling of Stress : Very much  Social Connections: Moderately Isolated   Frequency of Communication with Friends and Family: More than three times a week   Frequency of Social Gatherings with Friends and Family: Once a week   Attends Religious Services: Never   Marine scientist or Organizations: No   Attends Archivist Meetings: Never   Marital Status: Living with partner    Additional Social History: Patient resides in [place] with her two children (son/daughter) and her three grandchildren. She is divorced and notes she recently broke up with her boyfriend. She woks at [employer]. She denies tobacco, alcohol, or illegal drug use.   Allergies:  No Known Allergies  Metabolic Disorder Labs: Lab Results  Component Value Date   HGBA1C 5.9 (A) 01/03/2021   No results found for: PROLACTIN  No results found for: CHOL, TRIG, HDL, CHOLHDL, VLDL, LDLCALC No results found for: TSH  Therapeutic Level Labs: No results found for: LITHIUM No results found for: CBMZ No results found for: VALPROATE  Current Medications: Current Outpatient Medications  Medication Sig Dispense Refill   FLUoxetine (PROZAC) 10 MG capsule Take 1 capsule (10 mg total) by mouth daily. 30 capsule 3   traZODone (DESYREL) 50 MG tablet Take 0.5-1 tablets (25-50 mg total) by mouth at bedtime. 30 tablet 3   apixaban (ELIQUIS) 5 MG TABS tablet Take 1 tablet (5 mg total) by mouth 2 (two) times daily. 60 tablet 2   folic acid (FOLVITE) 1 MG tablet Take 1 mg by mouth daily. (Patient not taking: No sig reported)     lidocaine-prilocaine (EMLA) cream Apply to affected area once 30 g 3   sucralfate (CARAFATE) 1 g tablet Take 1 tablet (1 g total) by mouth 3 (three) times daily. Dissolve tablet into 3 to 4 tablespoons of water, then swish and swallow. (Patient not taking: Reported on 06/06/2021) 90 tablet 0    No current facility-administered medications for this visit.    Musculoskeletal: Strength & Muscle Tone:  Unable to assess due due telehealth visit Gait & Station:  Unable to assess due due telehealth visit Patient leans: N/A  Psychiatric Specialty Exam: Review of Systems  There were no vitals taken for this visit.There is no height or weight on file to calculate BMI.  General Appearance: Well Groomed  Eye Contact:  Good  Speech:  Clear and Coherent and Normal Rate  Volume:  Normal  Mood:  Anxious and Depressed  Affect:  Appropriate and Congruent  Thought Process:  Coherent, Goal Directed, and Linear  Orientation:  Full (Time, Place, and Person)  Thought Content:  WDL and Logical  Suicidal Thoughts:  Yes.  without intent/plan  Homicidal Thoughts:  No  Memory:  Immediate;   Good Recent;   Good Remote;   Fair  Judgement:  Good  Insight:  Good  Psychomotor Activity:  Normal  Concentration:  Concentration: Good and Attention Span: Good  Recall:  Good  Fund of Knowledge:Good  Language: Good  Akathisia:  No  Handed:  Right  AIMS (if indicated):  not done  Assets:  Communication Skills Desire for Improvement Financial Resources/Insurance Housing Physical Health Social Support  ADL's:  Intact  Cognition: WNL  Sleep:  Poor   Screenings: GAD-7    Flowsheet Row Office Visit from 07/15/2021 in [facility] Office Visit from 01/03/2021 in [place]  Total GAD-7 Score 12 11      PHQ2-9    [place] Office Visit from 07/15/2021 in [facility] Counselor from 02/19/2021 in [facility] Office Visit from 01/03/2021 in [place]  PHQ-2 Total Score 6 6 2   PHQ-9 Total Score 19 19 9       [place] Office Visit from 07/15/2021 in [facility] Counselor from 02/19/2021 in [facility] ED  to Hosp-Admission (Discharged) from 11/09/2020 in [place] (1C)  C-SSRS RISK CATEGORY Error: Q7 should not be populated when Q6 is No Low Risk No Risk       Assessment and Plan: Patient endorses symptoms of anxiety, depression, and insomnia. Today she is agreeable to starting Prozac 10 mg to help manage anxiety and depression. She is also agreeable to start trazodone 25-50 mg nightly as  needed. She was referred to outpatient counseling for therapy.  1. Moderate episode of recurrent major depressive disorder (HCC)  Start- FLUoxetine (PROZAC) 10 MG capsule; Take 1 capsule (10 mg total) by mouth daily.  Dispense: 30 capsule; Refill: 3 Start- traZODone (DESYREL) 50 MG tablet; Take 0.5-1 tablets (25-50 mg total) by mouth at bedtime.  Dispense: 30 tablet; Refill: 3 - Ambulatory referral to Social Work  2. Generalized anxiety disorder  Start- FLUoxetine (PROZAC) 10 MG capsule; Take 1 capsule (10 mg total) by mouth daily.  Dispense: 30 capsule; Refill: 3 Start- traZODone (DESYREL) 50 MG tablet; Take 0.5-1 tablets (25-50 mg total) by mouth at bedtime.  Dispense: 30 tablet; Refill: 3 - Ambulatory referral to Social Work  Follow up in 3 months Follow up with therapy  Salley Slaughter, NP 11/1/20228:29 AM

## 2021-07-15 NOTE — BH Assessment (Signed)
Care Management - Follow Up [facility] Discharges   Writer attempted to make contact with patient today and was unsuccessful.  Writer left a HIPPA compliant voice message.   Per chart review,patient completed his follow up appt with Burt Ek, NP on 07-15-2021.

## 2021-07-22 ENCOUNTER — Ambulatory Visit: Admission: RE | Admit: 2021-07-22 | Payer: 59 | Source: Ambulatory Visit

## 2021-07-24 ENCOUNTER — Other Ambulatory Visit: Payer: Self-pay

## 2021-07-24 MED FILL — Apixaban Tab 5 MG: ORAL | 30 days supply | Qty: 60 | Fill #1 | Status: AC

## 2021-07-24 NOTE — Progress Notes (Deleted)
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  She has approximately 1 week left of her adjuvant XRT.  She has noticed increased cough as well as flank pain.  She also complains of dysphagia that is helped with Carafate.  She has no neurologic complaints.  She denies any recent fevers or illnesses.  She has a fair appetite, but denies weight loss.  She denies any chest pain, shortness of breath, or hemoptysis.  She denies any nausea, vomiting, constipation, or diarrhea.  She has no urinary complaints.  Patient offers no further specific complaints today.  REVIEW OF SYSTEMS:   Review of Systems  Constitutional: Negative.  Negative for fever, malaise/fatigue and weight loss.  Respiratory:  Positive for cough. Negative for hemoptysis and shortness of breath.   Cardiovascular: Negative.  Negative for chest pain and leg swelling.  Gastrointestinal: Negative.  Negative for abdominal pain and nausea.  Genitourinary:  Positive for flank pain. Negative for dysuria.  Musculoskeletal:  Negative for back pain.  Skin: Negative.  Negative for rash.  Neurological: Negative.  Negative for dizziness, focal weakness, weakness and headaches.  Psychiatric/Behavioral: Negative.  The patient is not nervous/anxious.    As per HPI. Otherwise, a complete review of systems is negative.  PAST MEDICAL HISTORY: Past Medical History:  Diagnosis Date   Cancer ([place])    cervical   Depression    Non Hodgkin's lymphoma ([place])     PAST SURGICAL HISTORY: Past Surgical History:  Procedure Laterality Date   ABDOMINAL HYSTERECTOMY     CESAREAN SECTION     CHOLECYSTECTOMY     IR  PERC PLEURAL DRAIN W/INDWELL CATH W/IMG GUIDE  11/11/2020   IR REMOVAL OF PLURAL CATH W/CUFF  12/13/2020   PORTA CATH INSERTION N/A 11/18/2020   Procedure: PORTA CATH INSERTION;  Surgeon: Algernon Huxley, MD;  Location: [facility];  Service: Cardiovascular;  Laterality: N/A;    FAMILY HISTORY: Family History  Problem Relation Age of Onset   Breast cancer Neg Hx    Ovarian cancer Neg Hx    Colon cancer Neg Hx    Diabetes Neg Hx     ADVANCED DIRECTIVES (Y/N):  N  HEALTH MAINTENANCE: Social History   Tobacco Use   Smoking status: Never   Smokeless tobacco: Never  Substance Use Topics   Alcohol use: Never   Drug use: Never     Colonoscopy:  PAP:  Bone density:  Lipid panel:  No Known Allergies  Current Outpatient Medications  Medication Sig Dispense Refill   apixaban (ELIQUIS) 5 MG TABS tablet Take 1 tablet (5 mg total) by mouth 2 (two) times daily. 60 tablet 2   FLUoxetine (PROZAC) 10 MG capsule Take 1 capsule (10 mg total) by mouth daily. 30 capsule 3   folic acid (FOLVITE) 1 MG tablet Take 1 mg by mouth daily. (Patient not taking: No sig reported)     lidocaine-prilocaine (EMLA) cream Apply to affected area once 30 g 3   sucralfate (CARAFATE) 1 g tablet Take 1 tablet (1 g total) by mouth 3 (three) times daily. Dissolve tablet into 3 to 4 tablespoons of water, then swish and swallow. (Patient not taking: Reported on 06/06/2021) 90  tablet 0   traZODone (DESYREL) 50 MG tablet Take 0.5-1 tablets (25-50 mg total) by mouth at bedtime. 30 tablet 3   No current facility-administered medications for this visit.    OBJECTIVE: There were no vitals filed for this visit.    There is no height or weight on file to calculate BMI.    ECOG FS:0 - Asymptomatic  General: Well-developed, well-nourished, no acute distress. Eyes: Pink conjunctiva, anicteric sclera. HEENT: Normocephalic, moist mucous membranes. Lungs: No audible wheezing or coughing. Heart: Regular rate and  rhythm. Abdomen: Soft, nontender, no obvious distention. Musculoskeletal: No edema, cyanosis, or clubbing. Neuro: Alert, answering all questions appropriately. Cranial nerves grossly intact. Skin: No rashes or petechiae noted. Psych: Normal affect.   LAB RESULTS:  Lab Results  Component Value Date   NA 139 06/06/2021   K 4.2 06/06/2021   CL 103 06/06/2021   CO2 27 06/06/2021   GLUCOSE 108 (H) 06/06/2021   BUN 16 06/06/2021   CREATININE 0.58 06/06/2021   CALCIUM 9.5 06/06/2021   PROT 7.5 06/06/2021   ALBUMIN 4.5 06/06/2021   AST 32 06/06/2021   ALT 31 06/06/2021   ALKPHOS 80 06/06/2021   BILITOT 1.0 06/06/2021   GFRNONAA >60 06/06/2021    Lab Results  Component Value Date   WBC 5.0 06/06/2021   NEUTROABS 3.3 06/06/2021   HGB 13.6 06/06/2021   HCT 40.2 06/06/2021   MCV 90.3 06/06/2021   PLT 236 06/06/2021     STUDIES: CT Chest W Contrast  Result Date: 07/03/2021 CLINICAL DATA:  55 year old female with history of large B-cell lymphoma presenting with chest/back pain. EXAM: CT CHEST WITH CONTRAST TECHNIQUE: Multidetector CT imaging of the chest was performed during intravenous contrast administration. CONTRAST:  89m OMNIPAQUE IOHEXOL 350 MG/ML SOLN COMPARISON:  Chest CT 10/23/2020.  PET-CT 02/06/2021. FINDINGS: Cardiovascular: Heart size is normal. Small amount of pericardial thickening adjacent to the known anterior mediastinal mass. No pericardial calcification. No significant volume of pericardial fluid. No calcified atherosclerotic plaque in the thoracic aorta or coronary arteries. Right internal jugular single-lumen porta cath with tip terminating in the right atrium. Mediastinum/Nodes: Previously noted anterior mediastinal mass appears very similar to prior PET-CT 02/06/2021, substantially smaller than more remote prior chest CTA 10/23/2020. Currently, there is some residual sessile soft tissue filling portions of the anterior mediastinum without definite bulky  enlarging nodular component to clearly indicate local recurrence of disease. No new mediastinal or hilar lymphadenopathy noted otherwise. Esophagus is unremarkable in appearance. No axillary lymphadenopathy. Lungs/Pleura: No suspicious appearing pulmonary nodules or masses are noted. No acute consolidative airspace disease. No pleural effusions. Upper Abdomen: Status post cholecystectomy. Musculoskeletal: There are no aggressive appearing lytic or blastic lesions noted in the visualized portions of the skeleton. IMPRESSION: 1. Stable appearance of residual soft tissue in the anterior mediastinum, compatible with treated lymphoma. No definite signs to suggest locally recurrent disease noted on today's examination. Electronically Signed   By: DVinnie LangtonM.D.   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   PLAN:    1.  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  Bone marrow biopsy is negative for lymphoma.  Cardiac echo completed on March 28, 2021 was unchanged with an EF of 60 to 65%.  PET scan results Feb 07, 2021 reviewed independently with significant interval response to therapy.  Patient completed cycle  6 of Truxima plus CHOP on April 11, 2021.  Continue adjuvant daily XRT completing treatment on April 30, 2021.  Patient has a CT scheduled for 19th 2022.  Return to clinic in 3 months with repeat PET scan and further evaluation.   2.  Cough/flank pain: Possibly secondary to XRT.  Repeat imaging as above.  3.  Right arm DVT: Continue Eliquis as prescribed at least through the duration of her treatments.  Will send a referral to remove port if CT scan on August 19th is improved. 4.  Anemia: Chronic and unchanged.  Patient's hemoglobin is 11.7.  Patient expressed understanding and was in agreement with this plan. She  also understands that She can call clinic at any time with any questions, concerns, or complaints.   Cancer Staging Mediastinal (thymic) large B-cell lymphoma ([place]) Staging form: Hodgkin and Non-Hodgkin Lymphoma, AJCC 8th Edition - Clinical stage from 11/13/2020: Stage II bulky (Diffuse large B-cell lymphoma) - Signed by Lloyd Huger, MD on 11/13/2020   Lloyd Huger, MD   07/24/2021 12:29 PM

## 2021-07-29 ENCOUNTER — Inpatient Hospital Stay: Payer: 59 | Admitting: Oncology

## 2021-07-29 ENCOUNTER — Inpatient Hospital Stay: Payer: 59

## 2021-07-29 DIAGNOSIS — C8520 Mediastinal (thymic) large B-cell lymphoma, unspecified site: Secondary | ICD-10-CM

## 2021-07-31 ENCOUNTER — Ambulatory Visit: Admission: RE | Admit: 2021-07-31 | Payer: 59 | Source: Ambulatory Visit

## 2021-08-06 ENCOUNTER — Inpatient Hospital Stay: Payer: 59

## 2021-08-06 ENCOUNTER — Inpatient Hospital Stay: Payer: 59 | Admitting: Oncology

## 2021-08-13 ENCOUNTER — Inpatient Hospital Stay: Payer: 59 | Attending: Oncology

## 2021-08-13 ENCOUNTER — Ambulatory Visit
Admission: RE | Admit: 2021-08-13 | Discharge: 2021-08-13 | Disposition: A | Discharge status: Home / Self Care | Payer: 59 | Source: Ambulatory Visit | Attending: Oncology | Admitting: Oncology

## 2021-08-13 ENCOUNTER — Other Ambulatory Visit: Payer: Self-pay

## 2021-08-13 DIAGNOSIS — C8520 Mediastinal (thymic) large B-cell lymphoma, unspecified site: Secondary | ICD-10-CM | POA: Insufficient documentation

## 2021-08-13 DIAGNOSIS — C7800 Secondary malignant neoplasm of unspecified lung: Secondary | ICD-10-CM | POA: Diagnosis not present

## 2021-08-13 LAB — GLUCOSE, CAPILLARY: Glucose-Capillary: 111 mg/dL — ABNORMAL HIGH (ref 70–99)

## 2021-08-13 MED ORDER — FLUDEOXYGLUCOSE F - 18 (FDG) INJECTION
12.0700 | Freq: Once | INTRAVENOUS | Status: AC | PRN
  Administered 2021-08-13: 12.07 via INTRAVENOUS

## 2021-08-15 NOTE — Progress Notes (Signed)
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  She continues to have occasional left flank pain, particularly with coughing, but otherwise feels well. She has no neurologic complaints.  She denies any recent fevers or illnesses.  She has a fair appetite, but denies weight loss.  She denies any chest pain, shortness of breath, cough, or hemoptysis.  She denies any nausea, vomiting, constipation, or diarrhea.  She has no urinary complaints.  Patient offers no further specific complaints today.  REVIEW OF SYSTEMS:   Review of Systems  Constitutional: Negative.  Negative for fever, malaise/fatigue and weight loss.  Respiratory: Negative.  Negative for cough, hemoptysis and shortness of breath.   Cardiovascular: Negative.  Negative for chest pain and leg swelling.  Gastrointestinal: Negative.  Negative for abdominal pain and nausea.  Genitourinary:  Positive for flank pain. Negative for dysuria.  Musculoskeletal:  Negative for back pain.  Skin: Negative.  Negative for rash.  Neurological: Negative.  Negative for dizziness, focal weakness, weakness and headaches.  Psychiatric/Behavioral: Negative.  The patient is not nervous/anxious.    As per HPI. Otherwise, a complete review of systems is negative.  PAST MEDICAL HISTORY: Past Medical History:  Diagnosis Date   Cancer ([place])    cervical   Depression    Non Hodgkin's lymphoma ([place])     PAST SURGICAL HISTORY: Past Surgical History:  Procedure Laterality Date   ABDOMINAL HYSTERECTOMY     CESAREAN SECTION     CHOLECYSTECTOMY     IR PERC PLEURAL DRAIN W/INDWELL CATH  W/IMG GUIDE  11/11/2020   IR REMOVAL OF PLURAL CATH W/CUFF  12/13/2020   PORTA CATH INSERTION N/A 11/18/2020   Procedure: PORTA CATH INSERTION;  Surgeon: Algernon Huxley, MD;  Location: [facility];  Service: Cardiovascular;  Laterality: N/A;    FAMILY HISTORY: Family History  Problem Relation Age of Onset   Breast cancer Neg Hx    Ovarian cancer Neg Hx    Colon cancer Neg Hx    Diabetes Neg Hx     ADVANCED DIRECTIVES (Y/N):  N  HEALTH MAINTENANCE: Social History   Tobacco Use   Smoking status: Never   Smokeless tobacco: Never  Substance Use Topics   Alcohol use: Never   Drug use: Never     Colonoscopy:  PAP:  Bone density:  Lipid panel:  No Known Allergies  Current Outpatient Medications  Medication Sig Dispense Refill   FLUoxetine (PROZAC) 10 MG capsule Take 1 capsule (10 mg total) by mouth daily. 30 capsule 3   lidocaine-prilocaine (EMLA) cream Apply to affected area once 30 g 3   traZODone (DESYREL) 50 MG tablet Take 0.5-1 tablets (25-50 mg total) by mouth at bedtime. 30 tablet 3   No current facility-administered medications for this visit.    OBJECTIVE: Vitals:   08/19/21 1001  BP: 115/77  Pulse: (!) 105  Resp: 16  Temp: 97.9 F (36.6 C)  SpO2: 97%     Body mass index is 37.61 kg/m.    ECOG FS:0 - Asymptomatic  General: Well-developed, well-nourished, no acute distress. Eyes: Pink conjunctiva, anicteric sclera. HEENT: Normocephalic, moist mucous membranes. Lungs: No audible wheezing or  coughing. Heart: Regular rate and rhythm. Abdomen: Soft, nontender, no obvious distention. Musculoskeletal: No edema, cyanosis, or clubbing. Neuro: Alert, answering all questions appropriately. Cranial nerves grossly intact. Skin: No rashes or petechiae noted. Psych: Normal affect. Lymphatics: No palpable lymphadenopathy.   LAB RESULTS:  Lab Results  Component Value Date   NA 135 08/19/2021   K 4.0 08/19/2021   CL 98 08/19/2021   CO2 28 08/19/2021    GLUCOSE 110 (H) 08/19/2021   BUN 8 08/19/2021   CREATININE 0.54 08/19/2021   CALCIUM 9.0 08/19/2021   PROT 7.3 08/19/2021   ALBUMIN 3.8 08/19/2021   AST 29 08/19/2021   ALT 29 08/19/2021   ALKPHOS 107 08/19/2021   BILITOT 1.4 (H) 08/19/2021   GFRNONAA >60 08/19/2021    Lab Results  Component Value Date   WBC 7.4 08/19/2021   NEUTROABS 5.7 08/19/2021   HGB 12.0 08/19/2021   HCT 35.5 (L) 08/19/2021   MCV 89.4 08/19/2021   PLT 258 08/19/2021     STUDIES: NM PET Image Restag (PS) Skull Base To Thigh  Result Date: 08/14/2021 CLINICAL DATA:  Follow-up treatment strategy for lymphoma. EXAM: NUCLEAR MEDICINE PET SKULL BASE TO THIGH TECHNIQUE: 12.1 mCi F-18 FDG was injected intravenously. Full-ring PET imaging was performed from the skull base to thigh after the radiotracer. CT data was obtained and used for attenuation correction and anatomic localization. Fasting blood glucose: 111 mg/dl COMPARISON:  Chest CT dated July 02, 2021 PET-CT dated Feb 06, 2021 FINDINGS: Mediastinal blood pool activity: SUV max 3.3 Liver activity: SUV max 3.7 NECK: No hypermetabolic lymph nodes in the neck. Incidental CT findings: none CHEST: Ill-defined soft tissue of the anterior mediastinum, slightly decreased in size when compared with most recent prior chest CT with FDG uptake similar to mediastinal blood pool. New mildly hypermetabolic ill-defined solid nodular opacities of the left lower lobe. Reference new solid nodule of the left lower lobe measuring 1.2 cm on image 113 with an SUV max of 3.7. Interval increased size of solid nodular opacity of the paraspinal right lower lobe, measuring up to 1.5 x 2.5 cm, previously measured up to 7 mm. No hypermetabolic mediastinal or hilar nodes. Incidental CT findings: Right chest wall port with tip in the right atrium. Stable appearance of the thyroid. Right lower lobe consolidation with no significant FDG uptake, likely due to atelectasis. ABDOMEN/PELVIS: No abnormal  hypermetabolic activity within the liver, pancreas, adrenal glands, or spleen. No hypermetabolic lymph nodes in the abdomen or pelvis. Incidental CT findings: Cholecystectomy clips. Mild atherosclerotic disease of the thoracic aorta. Small fat containing umbilical hernia. Locules of air seen in the bladder. SKELETON: No focal hypermetabolic activity to suggest skeletal metastasis. Incidental CT findings: none IMPRESSION: 1. New and increased ill-defined solid nodular opacities of the bilateral lower lobes with mild hypermetabolic activity, possibly infectious or inflammatory, although pulmonary metastatic disease could have this appearance. Recommend short-term follow-up chest CT in 2-3 months for further evaluation. 2. Residual soft tissue of the anterior mediastinum with FDG uptake similar to mediastinal blood pool is slightly decreased when compared with most recent prior chest CT dated July 02, 2021, compatible with treated lymphoma. 3. No evidence of metastatic disease in the abdomen or pelvis. Electronically Signed   By: Yetta Glassman M.D.   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   PLAN:    1.  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 PET scans results from August 13, 2021 reviewed independently and reported as above with resolution of disease.  Residual hypermetabolism is likely infectious.  No intervention is needed at this time. Will repeat CT scan of chest in 3 months with follow up 1-2 days later.  If imaging is stable, we transition patient to imaging and evaluation every 6 months.    2.  Flank pain: No evidence of disease, likely secondary to history of chest tube in that area.  3.  Right arm DVT: Discontinue Eliquis and remove port. 4.  Anemia: Resolved. 5.  Elevated total  bilirubin:  Mild, monitor.  Patient expressed understanding and was in agreement with this plan. She also understands that She can call clinic at any time with any questions, concerns, or complaints.    Cancer Staging  Mediastinal (thymic) large B-cell lymphoma ([place]) Staging form: Hodgkin and Non-Hodgkin Lymphoma, AJCC 8th Edition - Clinical stage from 11/13/2020: Stage II bulky (Diffuse large B-cell lymphoma) - Signed by Lloyd Huger, MD on 11/13/2020   Lloyd Huger, MD   08/19/2021 5:38 PM

## 2021-08-19 ENCOUNTER — Inpatient Hospital Stay: Payer: 59

## 2021-08-19 ENCOUNTER — Other Ambulatory Visit: Payer: Self-pay

## 2021-08-19 ENCOUNTER — Inpatient Hospital Stay: Payer: 59 | Attending: Oncology | Admitting: Oncology

## 2021-08-19 VITALS — BP 115/77 | HR 105 | Temp 97.9°F | Resp 16 | Wt 233.0 lb

## 2021-08-19 DIAGNOSIS — C8520 Mediastinal (thymic) large B-cell lymphoma, unspecified site: Secondary | ICD-10-CM

## 2021-08-19 DIAGNOSIS — I82621 Acute embolism and thrombosis of deep veins of right upper extremity: Secondary | ICD-10-CM | POA: Insufficient documentation

## 2021-08-19 DIAGNOSIS — R109 Unspecified abdominal pain: Secondary | ICD-10-CM | POA: Insufficient documentation

## 2021-08-19 DIAGNOSIS — Z95828 Presence of other vascular implants and grafts: Secondary | ICD-10-CM

## 2021-08-19 LAB — COMPREHENSIVE METABOLIC PANEL
ALT: 29 U/L (ref 0–44)
AST: 29 U/L (ref 15–41)
Albumin: 3.8 g/dL (ref 3.5–5.0)
Alkaline Phosphatase: 107 U/L (ref 38–126)
Anion gap: 9 (ref 5–15)
BUN: 8 mg/dL (ref 6–20)
CO2: 28 mmol/L (ref 22–32)
Calcium: 9 mg/dL (ref 8.9–10.3)
Chloride: 98 mmol/L (ref 98–111)
Creatinine, Ser: 0.54 mg/dL (ref 0.44–1.00)
GFR, Estimated: >60 mL/min (ref >60)
Glucose, Bld: 110 mg/dL — ABNORMAL HIGH (ref 70–99)
Potassium: 4 mmol/L (ref 3.5–5.1)
Sodium: 135 mmol/L (ref 135–145)
Total Bilirubin: 1.4 mg/dL — ABNORMAL HIGH (ref 0.3–1.2)
Total Protein: 7.3 g/dL (ref 6.5–8.1)

## 2021-08-19 LAB — CBC WITH DIFFERENTIAL/PLATELET
Abs Immature Granulocytes: 0.03 10*3/uL (ref 0.00–0.07)
Basophils Absolute: 0 10*3/uL (ref 0.0–0.1)
Basophils Relative: 1 %
Eosinophils Absolute: 0.1 10*3/uL (ref 0.0–0.5)
Eosinophils Relative: 2 %
HCT: 35.5 % — ABNORMAL LOW (ref 36.0–46.0)
Hemoglobin: 12 g/dL (ref 12.0–15.0)
Immature Granulocytes: 0 %
Lymphocytes Relative: 10 %
Lymphs Abs: 0.7 10*3/uL (ref 0.7–4.0)
MCH: 30.2 pg (ref 26.0–34.0)
MCHC: 33.8 g/dL (ref 30.0–36.0)
MCV: 89.4 fL (ref 80.0–100.0)
Monocytes Absolute: 0.8 10*3/uL (ref 0.1–1.0)
Monocytes Relative: 10 %
Neutro Abs: 5.7 10*3/uL (ref 1.7–7.7)
Neutrophils Relative %: 77 %
Platelets: 258 10*3/uL (ref 150–400)
RBC: 3.97 MIL/uL (ref 3.87–5.11)
RDW: 12.8 % (ref 11.5–15.5)
WBC: 7.4 10*3/uL (ref 4.0–10.5)
nRBC: 0 % (ref 0.0–0.2)

## 2021-08-19 MED ORDER — HEPARIN SOD (PORK) LOCK FLUSH 100 UNIT/ML IV SOLN
500.0000 [IU] | Freq: Once | INTRAVENOUS | Status: AC
  Administered 2021-08-19: 500 [IU] via INTRAVENOUS
  Filled 2021-08-19: qty 5

## 2021-08-19 MED ORDER — SODIUM CHLORIDE 0.9% FLUSH
10.0000 mL | Freq: Once | INTRAVENOUS | Status: AC
  Administered 2021-08-19: 10 mL via INTRAVENOUS
  Filled 2021-08-19: qty 10

## 2021-08-19 NOTE — Progress Notes (Signed)
Pt reports productive cough with green phlegm x 2 weeks.  Taking otc mucinex with no relief.

## 2021-08-20 ENCOUNTER — Telehealth: Payer: Self-pay | Admitting: Emergency Medicine

## 2021-08-20 NOTE — Addendum Note (Signed)
Addended by: Wilford Corner on: 08/20/2021 01:42 PM   Modules accepted: Orders

## 2021-08-20 NOTE — Telephone Encounter (Signed)
Pt called and informed of port removal procedure. Pt verbalized understanding of instructions as well as confirmed availability for procedure on Wednesday 08/27/2021 at 130pm with arrival time of 1230pm

## 2021-08-22 NOTE — Progress Notes (Signed)
Patient on schedule for port removal 08/27/2021, called and spoke with patient on phone with pre procedure instructions given. Made aware to be here At 1230, NPO after 0630, day of procedure and driver post procedure/recovery/discharge. Stated understanding.

## 2021-08-26 ENCOUNTER — Other Ambulatory Visit: Payer: Self-pay | Admitting: Radiology

## 2021-08-27 ENCOUNTER — Encounter: Payer: Self-pay | Admitting: Oncology

## 2021-08-27 ENCOUNTER — Encounter: Payer: Self-pay | Admitting: Radiology

## 2021-08-27 ENCOUNTER — Other Ambulatory Visit: Payer: Self-pay

## 2021-08-27 ENCOUNTER — Ambulatory Visit
Admission: RE | Admit: 2021-08-27 | Discharge: 2021-08-27 | Disposition: A | Discharge status: Home / Self Care | Payer: 59 | Source: Ambulatory Visit | Attending: Oncology | Admitting: Oncology

## 2021-08-27 DIAGNOSIS — C8520 Mediastinal (thymic) large B-cell lymphoma, unspecified site: Secondary | ICD-10-CM | POA: Insufficient documentation

## 2021-08-27 DIAGNOSIS — Z86718 Personal history of other venous thrombosis and embolism: Secondary | ICD-10-CM | POA: Insufficient documentation

## 2021-08-27 DIAGNOSIS — Z452 Encounter for adjustment and management of vascular access device: Secondary | ICD-10-CM | POA: Insufficient documentation

## 2021-08-27 DIAGNOSIS — Z95828 Presence of other vascular implants and grafts: Secondary | ICD-10-CM

## 2021-08-27 HISTORY — PX: IR REMOVAL TUN ACCESS W/ PORT W/O FL MOD SED: IMG2290

## 2021-08-27 MED ORDER — SODIUM CHLORIDE 0.9 % IV SOLN
INTRAVENOUS | Status: DC
  Filled 2021-08-27: qty 1000

## 2021-08-27 MED ORDER — LIDOCAINE-EPINEPHRINE 1 %-1:100000 IJ SOLN
INTRAMUSCULAR | Status: AC
  Administered 2021-08-27: 13:00:00 9 mL
  Filled 2021-08-27: qty 1

## 2021-08-27 MED ORDER — MIDAZOLAM HCL 2 MG/2ML IJ SOLN
INTRAMUSCULAR | Status: AC
  Filled 2021-08-27: qty 2

## 2021-08-27 MED ORDER — MIDAZOLAM HCL 2 MG/2ML IJ SOLN
INTRAMUSCULAR | Status: DC | PRN
  Administered 2021-08-27: .5 mg via INTRAVENOUS
  Administered 2021-08-27: 1 mg via INTRAVENOUS
  Administered 2021-08-27: .5 mg via INTRAVENOUS

## 2021-08-27 NOTE — Procedures (Signed)
°  Procedure: R IJ Port catheter removal   EBL:   minimal Complications:  none immediate  See full dictation in [facility].  Dillard Cannon MD Main # [phone] Pager  [phone] Mobile [phone]

## 2021-08-27 NOTE — H&P (Signed)
Chief Complaint: Patient was seen in consultation today for B cell lymphoma at the request of Finnegan,Timothy J  Referring Physician(s): Finnegan,Timothy J  Supervising Physician: Arne Cleveland  Patient Status: [facility] - Out-pt  History of Present Illness: Jennifer Cole is a 55 y.o. 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 The patient follows with oncology with recent imaging PET 12/1 and office visit 12/6 with Dr. Grayland Ormond with resolution of disease and no longer needing port a catheter, request received for port removal. Patient had history of RUE DVT in one of paired right brachial veins in 11/2020 and was on Eliquis, recently was able to discontinue this, no follow-up US performed per patient.  The patient has had a H&P performed within the last 30 days, all history, medications, and exam have been reviewed. The patient denies any interval changes since the H&P.  The patient denies any chest pain or shortness of breath. The patient denies any recent infections, fever or chills. The patient denies any history of sleep apnea or chronic oxygen use. She has no known complications to sedation.    Past Medical History:  Diagnosis Date   Cancer ([place])    cervical   Depression    Non Hodgkin's lymphoma [facility])     Past Surgical History:  Procedure Laterality Date   ABDOMINAL HYSTERECTOMY     CESAREAN SECTION     CHOLECYSTECTOMY     IR PERC PLEURAL DRAIN W/INDWELL CATH W/IMG GUIDE  11/11/2020   IR REMOVAL OF PLURAL CATH W/CUFF  12/13/2020   PORTA CATH INSERTION N/A 11/18/2020   Procedure: PORTA CATH INSERTION;  Surgeon: Algernon Huxley, MD;  Location: [facility];  Service: Cardiovascular;  Laterality: N/A;    Allergies: Patient has no known allergies.  Medications: Prior to Admission medications   Medication Sig Start Date End Date Taking? Authorizing Provider  FLUoxetine  (PROZAC) 10 MG capsule Take 1 capsule (10 mg total) by mouth daily. 07/15/21  Yes Eulis Canner E, NP  lidocaine-prilocaine (EMLA) cream Apply to affected area once 06/19/21  Yes Lloyd Huger, MD  traZODone (DESYREL) 50 MG tablet Take 0.5-1 tablets (25-50 mg total) by mouth at bedtime. 07/15/21  Yes Salley Slaughter, NP     Family History  Problem Relation Age of Onset   Breast cancer Neg Hx    Ovarian cancer Neg Hx    Colon cancer Neg Hx    Diabetes Neg Hx     Social History   Socioeconomic History   Marital status: Legally Separated    Spouse name: Not on file   Number of children: Not on file   Years of education: Not on file   Highest education level: Not on file  Occupational History   Not on file  Tobacco Use   Smoking status: Never   Smokeless tobacco: Never  Substance and Sexual Activity   Alcohol use: Never   Drug use: Never   Sexual activity: Yes    Birth control/protection: Surgical  Other Topics Concern   Not on file  Social History Narrative   Not on file   Social Determinants of Health   Financial Resource Strain: Low Risk    Difficulty of Paying Living Expenses: Not hard at all  Food Insecurity: No Food Insecurity   Worried About Estate manager/land agent of Food in the Last Year: Never true   Arboriculturist in  the Last Year: Never true  Transportation Needs: No Transportation Needs   Lack of Transportation (Medical): No   Lack of Transportation (Non-Medical): No  Physical Activity: Inactive   Days of Exercise per Week: 0 days   Minutes of Exercise per Session: 0 min  Stress: Stress Concern Present   Feeling of Stress : Very much  Social Connections: Moderately Isolated   Frequency of Communication with Friends and Family: More than three times a week   Frequency of Social Gatherings with Friends and Family: Once a week   Attends Religious Services: Never   Marine scientist or Organizations: No   Attends Archivist Meetings: Never    Marital Status: Living with partner    Review of Systems: A 12 point ROS discussed and pertinent positives are indicated in the HPI above.  All other systems are negative.  Review of Systems  Vital Signs: BP 121/80    Pulse (!) 101    Temp 98.4 F (36.9 C) (Oral)    Resp 20    Ht 5\' 5"  (1.651 m)    Wt 220 lb (99.8 kg)    SpO2 98%    BMI 36.61 kg/m   Physical Exam Constitutional:      Appearance: Normal appearance.     Comments: Right port a catheter with well healed incision and no skin changes overlying the port.  HENT:     Head: Normocephalic and atraumatic.  Cardiovascular:     Rate and Rhythm: Regular rhythm. Tachycardia present.  Pulmonary:     Effort: Pulmonary effort is normal. No respiratory distress.     Breath sounds: Normal breath sounds.  Skin:    General: Skin is warm and dry.  Neurological:     Mental Status: She is alert and oriented to person, place, and time.    Imaging: NM PET Image Restag (PS) Skull Base To Thigh  Result Date: 08/14/2021 CLINICAL DATA:  Follow-up treatment strategy for lymphoma. EXAM: NUCLEAR MEDICINE PET SKULL BASE TO THIGH TECHNIQUE: 12.1 mCi F-18 FDG was injected intravenously. Full-ring PET imaging was performed from the skull base to thigh after the radiotracer. CT data was obtained and used for attenuation correction and anatomic localization. Fasting blood glucose: 111 mg/dl COMPARISON:  Chest CT dated July 02, 2021 PET-CT dated Feb 06, 2021 FINDINGS: Mediastinal blood pool activity: SUV max 3.3 Liver activity: SUV max 3.7 NECK: No hypermetabolic lymph nodes in the neck. Incidental CT findings: none CHEST: Ill-defined soft tissue of the anterior mediastinum, slightly decreased in size when compared with most recent prior chest CT with FDG uptake similar to mediastinal blood pool. New mildly hypermetabolic ill-defined solid nodular opacities of the left lower lobe. Reference new solid nodule of the left lower lobe measuring 1.2 cm on  image 113 with an SUV max of 3.7. Interval increased size of solid nodular opacity of the paraspinal right lower lobe, measuring up to 1.5 x 2.5 cm, previously measured up to 7 mm. No hypermetabolic mediastinal or hilar nodes. Incidental CT findings: Right chest wall port with tip in the right atrium. Stable appearance of the thyroid. Right lower lobe consolidation with no significant FDG uptake, likely due to atelectasis. ABDOMEN/PELVIS: No abnormal hypermetabolic activity within the liver, pancreas, adrenal glands, or spleen. No hypermetabolic lymph nodes in the abdomen or pelvis. Incidental CT findings: Cholecystectomy clips. Mild atherosclerotic disease of the thoracic aorta. Small fat containing umbilical hernia. Locules of air seen in the bladder. SKELETON: No focal hypermetabolic activity  to suggest skeletal metastasis. Incidental CT findings: none IMPRESSION: 1. New and increased ill-defined solid nodular opacities of the bilateral lower lobes with mild hypermetabolic activity, possibly infectious or inflammatory, although pulmonary metastatic disease could have this appearance. Recommend short-term follow-up chest CT in 2-3 months for further evaluation. 2. Residual soft tissue of the anterior mediastinum with FDG uptake similar to mediastinal blood pool is slightly decreased when compared with most recent prior chest CT dated July 02, 2021, compatible with treated lymphoma. 3. No evidence of metastatic disease in the abdomen or pelvis. Electronically Signed   By: Yetta Glassman M.D.   On: 08/14/2021 12:32    Labs:  CBC: Recent Labs    04/17/21 1103 04/24/21 1337 06/06/21 0949 08/19/21 0933  WBC 4.4 4.6 5.0 7.4  HGB 11.9* 11.7* 13.6 12.0  HCT 35.7* 35.9* 40.2 35.5*  PLT 271 217 236 258    COAGS: No results for input(s): INR, APTT in the last 8760 hours.  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 The patient follows with oncology with recent imaging PET 12/1 and office visit 12/6 with Dr. Grayland Ormond with resolution of disease and no longer needing port a catheter, request received for port removal. Patient had history of RUE DVT in one of paired right brachial veins in 11/2020 and was on Eliquis, recently was able to discontinue this, no follow-up US performed per patient.  The patient has been NPO, no blood thinners taken, labs and vitals have been reviewed.  Risks and benefits of image guided port-a-catheter removal was discussed with the patient including, but not limited to bleeding, infection, pneumothorax, or damage to adjacent structures.   All of the patient's questions were answered, patient is agreeable to proceed. Consent signed and in chart.   Thank you for this interesting consult.  I greatly enjoyed meeting Marion Seese and look forward to participating in their care.  A copy of this report was sent to the requesting provider on this date.  Electronically Signed: Hedy Jacob, PA-C 08/27/2021, 12:52 PM   I spent a total of 15 Minutes in face to face in clinical consultation, greater than 50% of which was counseling/coordinating care for lymphoma, no longer needing port a catheter.

## 2021-08-27 NOTE — Progress Notes (Signed)
Dr Vernard Gambles notified that patient transport for today is [facility] Popper via [facility]. Discussed with MD discharge instructions and MD request to place moderate sedation instructions due to medication patient received. MD ok with patient discharge home via ub[facility].

## 2021-09-09 ENCOUNTER — Ambulatory Visit (INDEPENDENT_AMBULATORY_CARE_PROVIDER_SITE_OTHER): Payer: 59 | Admitting: Clinical

## 2021-09-09 ENCOUNTER — Other Ambulatory Visit: Payer: Self-pay

## 2021-09-09 DIAGNOSIS — F331 Major depressive disorder, recurrent, moderate: Secondary | ICD-10-CM | POA: Diagnosis not present

## 2021-09-09 NOTE — Progress Notes (Signed)
° °  THERAPIST PROGRESS NOTE  Session Time: 54 minutes  Participation Level: Active  Behavioral Response: CasualAlertDepressed  Type of Therapy: Individual Therapy  Treatment Goals addressed: Coping  Interventions: CBT and Supportive  Summary:  Jennifer Cole is a 55 y.o. female who presents for the scheduled session oriented times five, appropriately dressed, and friendly. Client denied hallucinations and delusions. Client presents for initial appointment with this therapist. Client reported on today that she has continuously struggled with depressive symptoms. Client reported she recently started with a [facility] psychiatrist in November 2022 but feels her medications need to be adjusted. Client reported she does not take her sleeping medication because it is not working. Client reported like wise her medication for depression is not working and would like the dosage to be increased. Client reported something positive she noticed is that she has not been suicidal. Client reported external factors that contribute to her depression include her current living situation and coming out of eight year relationship. Client reported her ex husband moved in with her and her son so they could help take care of him since his stomach cancer diagnosis. Client reported her children have also been a stressor due to them not actively making plans to move out. Client reported her daughter has 3 children who also stay in the home. Client reported her daughter is reliant on her to get her to and from work. Client reported that caused her to leave her previous job. Client reported collectively it is seven people in a 2 bedroom house. Client reported she has also been coping coming out of 8 year relationship. Client reported he was with another woman while they were dating. Client reported overall she feels that her self esteem is low and wants to get back to feeling like herself. Client reported February 2022 she was  diagnosed with stage 2 Hodgkin lymphoma and as of November 2022 was told by her doctor she is now in remission.      Suicidal/Homicidal: Nowithout intent/plan  Therapist Response:  Therapist began the appointment making introductions and discussing confidentiality with the patient. Therapist used CBT to engage the client asking how she is been doing since last seen. Therapist used CBT to ask the client about changes she has noticed since beginning medication management in her compliance. Therapist used CBT to ask the client to describe psychosocial stressors that affect her mental health symptoms. Therapist used CBT to engage with the client to normalize her emotional response to stressors within normal limits. Therapist assigned the client homework to identify what her goals for therapy are. Client was scheduled for next appointment.    Plan: Return again in 5 weeks.  Diagnosis: Moderate episode of recurrent major depressive disorder   Nalany Steedley Y Analynn Daum, LCSW 09/09/2021

## 2021-09-11 ENCOUNTER — Encounter: Payer: Self-pay | Admitting: Oncology

## 2021-09-19 ENCOUNTER — Encounter: Payer: Self-pay | Admitting: Oncology

## 2021-10-09 ENCOUNTER — Telehealth (HOSPITAL_COMMUNITY): Payer: PRIVATE HEALTH INSURANCE | Admitting: Psychiatry

## 2021-10-17 ENCOUNTER — Inpatient Hospital Stay: Payer: Medicaid Other | Attending: Radiation Oncology

## 2021-10-17 ENCOUNTER — Ambulatory Visit: Payer: Medicaid Other | Admitting: Radiation Oncology

## 2021-10-27 ENCOUNTER — Ambulatory Visit: Payer: Medicaid Other | Admitting: Radiation Oncology

## 2021-11-04 ENCOUNTER — Encounter: Payer: Self-pay | Admitting: Radiation Oncology

## 2021-11-04 ENCOUNTER — Inpatient Hospital Stay: Payer: Medicaid Other | Attending: Radiation Oncology

## 2021-11-04 ENCOUNTER — Other Ambulatory Visit: Payer: Self-pay

## 2021-11-04 ENCOUNTER — Ambulatory Visit
Admission: RE | Admit: 2021-11-04 | Discharge: 2021-11-04 | Disposition: A | Discharge status: Home / Self Care | Payer: Medicaid Other | Source: Ambulatory Visit | Attending: Radiation Oncology | Admitting: Radiation Oncology

## 2021-11-04 VITALS — BP 112/81 | HR 99 | Temp 97.7°F | Resp 18 | Wt 231.1 lb

## 2021-11-04 DIAGNOSIS — C8520 Mediastinal (thymic) large B-cell lymphoma, unspecified site: Secondary | ICD-10-CM

## 2021-11-04 DIAGNOSIS — Z923 Personal history of irradiation: Secondary | ICD-10-CM | POA: Insufficient documentation

## 2021-11-04 DIAGNOSIS — R0989 Other specified symptoms and signs involving the circulatory and respiratory systems: Secondary | ICD-10-CM | POA: Insufficient documentation

## 2021-11-04 NOTE — Progress Notes (Signed)
Radiation Oncology Follow up Note  Name: Jennifer Cole   Date:   11/04/2021 MRN:  [id number] DOB: Dec 23, 1965    This 56 y.o. female presents to the clinic today for 66-month follow-up status post involved field radiation therapy to her mediastinum for large B-cell lymphoma status postchemotherapy with excellent response.  REFERRING PROVIDER: No ref. provider found  HPI: Patient is a 56 year old female now out 5 months having completed involved field radiation therapy to her mediastinum for B-cell lymphoma.  Seen today in follow-up she is doing fairly well she still has some congestion in her chest specifically denies fever chills night sweats or any adenopathy..  She had a PET scan back in November which I have reviewed showed increased ill-defined opacities bilateral lower lobes with mild hypermetabolic activity possibly infectious.  There is also some residual soft tissue in the anterior mediastinum with minimal FDG uptake.  She is currently under observation.  She just had her Port-A-Cath removed  COMPLICATIONS OF TREATMENT: none  FOLLOW UP COMPLIANCE: keeps appointments   PHYSICAL EXAM:  BP 112/81 (BP Location: Left Arm, Patient Position: Sitting)    Pulse 99    Temp 97.7 F (36.5 C)    Resp 18    Wt 231 lb 1.6 oz (104.8 kg)    BMI 38.46 kg/m  Well-developed well-nourished patient in NAD. HEENT reveals PERLA, EOMI, discs not visualized.  Oral cavity is clear. No oral mucosal lesions are identified. Neck is clear without evidence of cervical or supraclavicular adenopathy. Lungs are clear to A&P. Cardiac examination is essentially unremarkable with regular rate and rhythm without murmur rub or thrill. Abdomen is benign with no organomegaly or masses noted. Motor sensory and DTR levels are equal and symmetric in the upper and lower extremities. Cranial nerves II through XII are grossly intact. Proprioception is intact. No peripheral adenopathy or edema is identified. No motor or sensory levels  are noted. Crude visual fields are within normal range.  RADIOLOGY RESULTS: PET scan reviewed compatible with above-stated findings  PLAN: Present time patient is doing well she has a CT scan scheduled for next month which I will review when it becomes available.  Have otherwise asked to see her back in 6 months for follow-up.  Patient continues close follow-up care with medical oncology.  Patient is to call with any concerns.  I would like to take this opportunity to thank you for allowing me to participate in the care of your patient.Noreene Filbert, MD

## 2021-11-10 ENCOUNTER — Ambulatory Visit (HOSPITAL_COMMUNITY): Payer: PRIVATE HEALTH INSURANCE | Admitting: Clinical

## 2021-11-13 NOTE — Progress Notes (Signed)
[facility]  Telephone:([phone] Fax:(336) [phone]  ID: Jennifer Cole OB: 1966-04-21  MR#: [id number]  QJJ#:[id number]  Patient Care Team: Pcp, No as PCP - General Telford Nab, RN as Oncology Nurse Navigator Noreene Filbert, MD as Consulting Physician (Radiation Oncology) Lloyd Huger, MD as Consulting Physician (Oncology)  CHIEF COMPLAINT: Stage II bulky primary mediastinal B-cell lymphoma  INTERVAL HISTORY: Patient returns to clinic today for further evaluation and discussion of her imaging results.  She currently feels well and is asymptomatic.  She has no neurologic complaints.  She denies any recent fevers or illnesses.  She has a fair appetite, but denies weight loss.  She denies any chest pain, shortness of breath, cough, or hemoptysis.  She denies any nausea, vomiting, constipation, or diarrhea.  She has no urinary complaints.  Patient offers no specific complaints today.  REVIEW OF SYSTEMS:   Review of Systems  Constitutional: Negative.  Negative for fever, malaise/fatigue and weight loss.  Respiratory: Negative.  Negative for cough, hemoptysis and shortness of breath.   Cardiovascular: Negative.  Negative for chest pain and leg swelling.  Gastrointestinal: Negative.  Negative for abdominal pain and nausea.  Genitourinary: Negative.  Negative for dysuria and flank pain.  Musculoskeletal:  Negative for back pain.  Skin: Negative.  Negative for rash.  Neurological: Negative.  Negative for dizziness, focal weakness, weakness and headaches.  Psychiatric/Behavioral: Negative.  The patient is not nervous/anxious.    As per HPI. Otherwise, a complete review of systems is negative.  PAST MEDICAL HISTORY: Past Medical History:  Diagnosis Date   Cancer ([place])    cervical   Depression    Non Hodgkin's lymphoma ([place])     PAST SURGICAL HISTORY: Past Surgical History:  Procedure Laterality Date   ABDOMINAL HYSTERECTOMY     CESAREAN SECTION      CHOLECYSTECTOMY     IR PERC PLEURAL DRAIN W/INDWELL CATH W/IMG GUIDE  11/11/2020   IR REMOVAL OF PLURAL CATH W/CUFF  12/13/2020   IR REMOVAL TUN ACCESS W/ PORT W/O FL MOD SED  08/27/2021   PORTA CATH INSERTION N/A 11/18/2020   Procedure: PORTA CATH INSERTION;  Surgeon: Algernon Huxley, MD;  Location: [facility];  Service: Cardiovascular;  Laterality: N/A;    FAMILY HISTORY: Family History  Problem Relation Age of Onset   Breast cancer Neg Hx    Ovarian cancer Neg Hx    Colon cancer Neg Hx    Diabetes Neg Hx     ADVANCED DIRECTIVES (Y/N):  N  HEALTH MAINTENANCE: Social History   Tobacco Use   Smoking status: Never   Smokeless tobacco: Never  Substance Use Topics   Alcohol use: Never   Drug use: Never     Colonoscopy:  PAP:  Bone density:  Lipid panel:  No Known Allergies  Current Outpatient Medications  Medication Sig Dispense Refill   FLUoxetine (PROZAC) 10 MG capsule Take 1 capsule (10 mg total) by mouth daily. (Patient not taking: Reported on 11/04/2021) 30 capsule 3   lidocaine-prilocaine (EMLA) cream Apply to affected area once (Patient not taking: Reported on 11/04/2021) 30 g 3   traZODone (DESYREL) 50 MG tablet Take 0.5-1 tablets (25-50 mg total) by mouth at bedtime. (Patient not taking: Reported on 11/04/2021) 30 tablet 3   No current facility-administered medications for this visit.    OBJECTIVE: There were no vitals filed for this visit.    There is no height or weight on file to calculate BMI.  ECOG FS:0 - Asymptomatic  General: Well-developed, well-nourished, no acute distress. Eyes: Pink conjunctiva, anicteric sclera. HEENT: Normocephalic, moist mucous membranes. Lungs: No audible wheezing or coughing. Heart: Regular rate and rhythm. Abdomen: Soft, nontender, no obvious distention. Musculoskeletal: No edema, cyanosis, or clubbing. Neuro: Alert, answering all questions appropriately. Cranial nerves grossly intact. Skin: No rashes or petechiae  noted. Psych: Normal affect.   LAB RESULTS:  Lab Results  Component Value Date   NA 135 08/19/2021   K 4.0 08/19/2021   CL 98 08/19/2021   CO2 28 08/19/2021   GLUCOSE 110 (H) 08/19/2021   BUN 8 08/19/2021   CREATININE 0.54 08/19/2021   CALCIUM 9.0 08/19/2021   PROT 7.3 08/19/2021   ALBUMIN 3.8 08/19/2021   AST 29 08/19/2021   ALT 29 08/19/2021   ALKPHOS 107 08/19/2021   BILITOT 1.4 (H) 08/19/2021   GFRNONAA >60 08/19/2021    Lab Results  Component Value Date   WBC 7.4 08/19/2021   NEUTROABS 5.7 08/19/2021   HGB 12.0 08/19/2021   HCT 35.5 (L) 08/19/2021   MCV 89.4 08/19/2021   PLT 258 08/19/2021     STUDIES: CT CHEST W CONTRAST  Result Date: 11/15/2021 CLINICAL DATA:  56 year old female with history of large B-cell non-Hodgkin's lymphoma diagnosed in February 2022 status post chemotherapy and radiation therapy. Intermittent cough. Follow-up study. * onc * EXAM: CT CHEST WITH CONTRAST TECHNIQUE: Multidetector CT imaging of the chest was performed during intravenous contrast administration. RADIATION DOSE REDUCTION: This exam was performed according to the departmental dose-optimization program which includes automated exposure control, adjustment of the mA and/or kV according to patient size and/or use of iterative reconstruction technique. CONTRAST:  6mL OMNIPAQUE IOHEXOL 300 MG/ML  SOLN COMPARISON:  PET-CT 08/13/2021.  Chest CT 07/02/2021. FINDINGS: Cardiovascular: Heart size is normal. Trace amount of chronic pericardial thickening, stable compared to the prior examination. No significant volume of pericardial fluid. No pericardial calcification. No calcified atherosclerotic plaque noted in the thoracic aorta or the coronary arteries. Mediastinum/Nodes: Small amount of residual amorphous soft tissue throughout the anterior mediastinum, stable compared to the prior examination, compatible with treated lymphoma. No pathologically enlarged mediastinal or hilar lymph nodes are  otherwise noted. Esophagus is unremarkable in appearance. No axillary lymphadenopathy. Lungs/Pleura: Patchy areas of ground-glass attenuation and nodular airspace consolidation are noted in the lungs bilaterally, most evident in a paramediastinal distribution, likely to reflect evolving postradiation changes. No pleural effusions. No definite suspicious appearing pulmonary nodules or masses are otherwise noted. Upper Abdomen: Status post cholecystectomy. Musculoskeletal: There are no aggressive appearing lytic or blastic lesions noted in the visualized portions of the skeleton. IMPRESSION: 1. Stable appearance of treated mass in the anterior mediastinum. 2. Findings in the lungs likely reflect evolving postradiation changes with resolving postradiation pneumonitis and developing areas of postradiation fibrosis. Continued attention on follow-up studies is recommended. Electronically Signed   By: Vinnie Langton M.D.   On: 11/15/2021 09:50     ASSESSMENT: 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   PLAN:    1.  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 PET scans results from August 13, 2021 reviewed independently with resolution of disease.  Her most recent CT scan on November 15, 2021 reviewed independently and reported as above with no obvious evidence of recurrent or progressive disease.  No intervention is needed at this  time.  Return to clinic in 6 months with repeat CT scan and further evaluation.      2.  Flank pain: Patient does not complain of this today. 3.  Right arm DVT: Resolved.  Eliquis has been discontinued and port removed.  4.  Anemia: Resolved. 5.  Elevated total bilirubin:  Mild, monitor.  Patient expressed understanding and was in agreement with this plan. She also understands that She can call clinic at any time with any  questions, concerns, or complaints.    Cancer Staging  Mediastinal (thymic) large B-cell lymphoma ([place]) Staging form: Hodgkin and Non-Hodgkin Lymphoma, AJCC 8th Edition - Clinical stage from 11/13/2020: Stage II bulky (Diffuse large B-cell lymphoma) - Signed by Lloyd Huger, MD on 11/13/2020   Lloyd Huger, MD   11/20/2021 2:37 PM

## 2021-11-14 ENCOUNTER — Other Ambulatory Visit: Payer: Self-pay

## 2021-11-14 ENCOUNTER — Ambulatory Visit
Admission: RE | Admit: 2021-11-14 | Discharge: 2021-11-14 | Disposition: A | Discharge status: Home / Self Care | Payer: PRIVATE HEALTH INSURANCE | Source: Ambulatory Visit | Attending: Oncology | Admitting: Oncology

## 2021-11-14 ENCOUNTER — Encounter: Payer: Self-pay | Admitting: Oncology

## 2021-11-14 DIAGNOSIS — C8520 Mediastinal (thymic) large B-cell lymphoma, unspecified site: Secondary | ICD-10-CM | POA: Insufficient documentation

## 2021-11-14 MED ORDER — IOHEXOL 300 MG/ML  SOLN
75.0000 mL | Freq: Once | INTRAMUSCULAR | Status: AC | PRN
Start: 2021-11-14 — End: 2021-11-14
  Administered 2021-11-14: 75 mL via INTRAVENOUS

## 2021-11-20 ENCOUNTER — Encounter: Payer: Self-pay | Admitting: Oncology

## 2021-11-20 ENCOUNTER — Inpatient Hospital Stay: Payer: PRIVATE HEALTH INSURANCE

## 2021-11-20 ENCOUNTER — Other Ambulatory Visit: Payer: Self-pay

## 2021-11-20 ENCOUNTER — Inpatient Hospital Stay: Payer: PRIVATE HEALTH INSURANCE | Attending: Oncology | Admitting: Oncology

## 2021-11-20 DIAGNOSIS — F331 Major depressive disorder, recurrent, moderate: Secondary | ICD-10-CM

## 2021-11-20 DIAGNOSIS — C8520 Mediastinal (thymic) large B-cell lymphoma, unspecified site: Secondary | ICD-10-CM | POA: Diagnosis not present

## 2021-11-20 DIAGNOSIS — Z923 Personal history of irradiation: Secondary | ICD-10-CM | POA: Insufficient documentation

## 2021-11-20 DIAGNOSIS — Z9221 Personal history of antineoplastic chemotherapy: Secondary | ICD-10-CM | POA: Insufficient documentation

## 2021-11-20 DIAGNOSIS — Z79899 Other long term (current) drug therapy: Secondary | ICD-10-CM | POA: Insufficient documentation

## 2021-11-20 DIAGNOSIS — F32A Depression, unspecified: Secondary | ICD-10-CM | POA: Insufficient documentation

## 2021-11-20 DIAGNOSIS — F419 Anxiety disorder, unspecified: Secondary | ICD-10-CM | POA: Insufficient documentation

## 2021-11-20 DIAGNOSIS — F411 Generalized anxiety disorder: Secondary | ICD-10-CM | POA: Diagnosis not present

## 2021-11-20 NOTE — Progress Notes (Signed)
Pt is having some anxiety and depression and would like to speak to someone about it. ?She states that there is 7 people living inside of her 2 bedroom home. She works 3rd shift and when she goes home to sleep there is 3 kids there and she cannot rest. Pt, Daughter, 3 grandchildren, son and ex husband all lives together. ? ?Also Pt's boyfriend of 8 years broke up with her in October and she is still having to deal with this daily. ? ?I told pt we would put in a referral for a social worker to reach out to her. ? ?At this time pt does not have any suicidal thoughts. ? ?

## 2021-11-20 NOTE — Progress Notes (Signed)
Survivorship Care Plan visit completed.  Treatment summary reviewed and given to patient.  ASCO answers booklet reviewed and given to patient.  CARE program and Cancer Transitions discussed with patient along with other resources cancer center offers to patients and caregivers.  Patient verbalized understanding.    

## 2021-11-24 ENCOUNTER — Encounter: Payer: Self-pay | Admitting: Licensed Clinical Social Worker

## 2021-11-24 NOTE — Progress Notes (Signed)
[facility] [facility] ?Clinical Social Work ? ?Clinical Social Work was referred by medical provider for assessment of psychosocial needs.  Clinical Social Worker contacted patient by phone  to offer support and assess for needs.  CSW left voicemail with contact information and request for return call. ? ? ? ? ? ?Eleonor Ocon, LCSW  ?Clinical Social Worker ?[facility] ?      ? ?

## 2021-12-01 ENCOUNTER — Ambulatory Visit (HOSPITAL_COMMUNITY): Payer: PRIVATE HEALTH INSURANCE | Admitting: Clinical

## 2021-12-01 ENCOUNTER — Inpatient Hospital Stay: Payer: PRIVATE HEALTH INSURANCE | Admitting: Licensed Clinical Social Worker

## 2021-12-01 ENCOUNTER — Encounter: Payer: Self-pay | Admitting: Licensed Clinical Social Worker

## 2021-12-05 ENCOUNTER — Encounter: Payer: Self-pay | Admitting: Oncology

## 2021-12-10 ENCOUNTER — Encounter: Payer: Self-pay | Admitting: Licensed Clinical Social Worker

## 2021-12-10 ENCOUNTER — Encounter: Payer: Self-pay | Admitting: Oncology

## 2021-12-10 NOTE — Progress Notes (Signed)
[facility] [facility] CSW Progress Note ? ?Clinical Social Worker contacted patient by phone to follow-up on previous call.  CSW left voicemail with contact information and a request for a return call to reschedule appointment. ? ? ? ?Nakayla Rorabaugh , LCSW ?

## 2021-12-10 NOTE — Progress Notes (Signed)
[facility] CSW Progress Note ? ?Clinical Social Worker contacted patient by phone to rescheduled appointment, patient no-showed. CSW left voicemail with contact information and request for a return call. ? ? ? ?Ronith Berti , LCSW ?

## 2022-05-01 ENCOUNTER — Telehealth: Payer: Self-pay | Admitting: *Deleted

## 2022-05-01 NOTE — Telephone Encounter (Signed)
Gabriel Cirri, NT tried to contact patient to let her know that we would be able to transport her from [facility] for her appointment on Monday afternoon.  There was no answer, no VM.

## 2022-05-04 ENCOUNTER — Ambulatory Visit: Payer: Medicaid Other | Attending: Radiation Oncology | Admitting: Radiation Oncology

## 2022-05-22 ENCOUNTER — Ambulatory Visit: Admission: RE | Admit: 2022-05-22 | Payer: PRIVATE HEALTH INSURANCE | Source: Ambulatory Visit

## 2022-05-25 ENCOUNTER — Telehealth: Payer: Self-pay | Admitting: Oncology

## 2022-05-25 ENCOUNTER — Telehealth: Payer: Self-pay | Admitting: Internal Medicine

## 2022-05-25 NOTE — Telephone Encounter (Signed)
Attempt made to reach patient, her phone number does not appear to be in service. Patient missed her CT scan and therefore we are cancelling her appointment with Dr. Grayland Ormond tomorrow, as it was to review these results. Will send patient a letter in the mail as well and request she call back to get rescheduled.

## 2022-05-26 ENCOUNTER — Inpatient Hospital Stay: Payer: Medicaid Other | Admitting: Oncology

## 2022-05-29 ENCOUNTER — Ambulatory Visit
Admission: RE | Admit: 2022-05-29 | Discharge: 2022-05-29 | Disposition: A | Discharge status: Home / Self Care | Payer: PRIVATE HEALTH INSURANCE | Source: Ambulatory Visit | Attending: Oncology | Admitting: Oncology

## 2022-05-29 DIAGNOSIS — C8520 Mediastinal (thymic) large B-cell lymphoma, unspecified site: Secondary | ICD-10-CM | POA: Diagnosis present

## 2022-05-29 MED ORDER — IOHEXOL 300 MG/ML  SOLN
75.0000 mL | Freq: Once | INTRAMUSCULAR | Status: AC | PRN
  Administered 2022-05-29: 75 mL via INTRAVENOUS

## 2022-06-05 NOTE — Progress Notes (Signed)
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  She continues to feel well and remains asymptomatic.  She has no neurologic complaints.  She denies any recent fevers or illnesses.  She has a fair appetite, but denies weight loss.  She denies any chest pain, shortness of breath, cough, or hemoptysis.  She denies any nausea, vomiting, constipation, or diarrhea.  She has no urinary complaints.  Patient offers no specific complaints today.  REVIEW OF SYSTEMS:   Review of Systems  Constitutional: Negative.  Negative for fever, malaise/fatigue and weight loss.  Respiratory: Negative.  Negative for cough, hemoptysis and shortness of breath.   Cardiovascular: Negative.  Negative for chest pain and leg swelling.  Gastrointestinal: Negative.  Negative for abdominal pain and nausea.  Genitourinary: Negative.  Negative for dysuria and flank pain.  Musculoskeletal:  Negative for back pain.  Skin: Negative.  Negative for rash.  Neurological: Negative.  Negative for dizziness, focal weakness, weakness and headaches.  Psychiatric/Behavioral: Negative.  The patient is not nervous/anxious.     As per HPI. Otherwise, a complete review of systems is negative.  PAST MEDICAL HISTORY: Past Medical History:  Diagnosis Date   Cancer ([place])    cervical   Depression    Non Hodgkin's lymphoma ([place])     PAST SURGICAL HISTORY: Past Surgical History:  Procedure Laterality Date   ABDOMINAL HYSTERECTOMY     CESAREAN  SECTION     CHOLECYSTECTOMY     IR PERC PLEURAL DRAIN W/INDWELL CATH W/IMG GUIDE  11/11/2020   IR REMOVAL OF PLURAL CATH W/CUFF  12/13/2020   IR REMOVAL TUN ACCESS W/ PORT W/O FL MOD SED  08/27/2021   PORTA CATH INSERTION N/A 11/18/2020   Procedure: PORTA CATH INSERTION;  Surgeon: Algernon Huxley, MD;  Location: [facility];  Service: Cardiovascular;  Laterality: N/A;    FAMILY HISTORY: Family History  Problem Relation Age of Onset   Breast cancer Neg Hx    Ovarian cancer Neg Hx    Colon cancer Neg Hx    Diabetes Neg Hx     ADVANCED DIRECTIVES (Y/N):  N  HEALTH MAINTENANCE: Social History   Tobacco Use   Smoking status: Never   Smokeless tobacco: Never  Substance Use Topics   Alcohol use: Never   Drug use: Never     Colonoscopy:  PAP:  Bone density:  Lipid panel:  No Known Allergies  Current Outpatient Medications  Medication Sig Dispense Refill   FLUoxetine (PROZAC) 10 MG capsule Take 1 capsule (10 mg total) by mouth daily. (Patient not taking: Reported on 11/04/2021) 30 capsule 3   lidocaine-prilocaine (EMLA) cream Apply to affected area once (Patient not taking: Reported on 11/04/2021) 30 g 3   traZODone (DESYREL) 50 MG tablet Take 0.5-1 tablets (25-50 mg total) by mouth at bedtime. (Patient not taking: Reported on 11/04/2021) 30 tablet 3   No current facility-administered medications for this visit.    OBJECTIVE: Vitals:   06/11/22 1434  BP: 112/79  Pulse: (!) 108  Temp: 98.9 F (37.2 C)  Body mass index is 39.94 kg/m.    ECOG FS:0 - Asymptomatic  General: Well-developed, well-nourished, no acute distress. Eyes: Pink conjunctiva, anicteric sclera. HEENT: Normocephalic, moist mucous membranes. Lungs: No audible wheezing or coughing. Heart: Regular rate and rhythm. Abdomen: Soft, nontender, no obvious distention. Musculoskeletal: No edema, cyanosis, or clubbing. Neuro: Alert, answering all questions appropriately. Cranial nerves grossly  intact. Skin: No rashes or petechiae noted. Psych: Normal affect.  LAB RESULTS:  Lab Results  Component Value Date   NA 135 08/19/2021   K 4.0 08/19/2021   CL 98 08/19/2021   CO2 28 08/19/2021   GLUCOSE 110 (H) 08/19/2021   BUN 8 08/19/2021   CREATININE 0.54 08/19/2021   CALCIUM 9.0 08/19/2021   PROT 7.3 08/19/2021   ALBUMIN 3.8 08/19/2021   AST 29 08/19/2021   ALT 29 08/19/2021   ALKPHOS 107 08/19/2021   BILITOT 1.4 (H) 08/19/2021   GFRNONAA >60 08/19/2021    Lab Results  Component Value Date   WBC 7.4 08/19/2021   NEUTROABS 5.7 08/19/2021   HGB 12.0 08/19/2021   HCT 35.5 (L) 08/19/2021   MCV 89.4 08/19/2021   PLT 258 08/19/2021     STUDIES: CT CHEST W CONTRAST  Result Date: 06/01/2022 CLINICAL DATA:  Large B-cell lymphoma restaging, status post chemotherapy and radiation cough, shortness of breath * Tracking Code: BO * EXAM: CT CHEST WITH CONTRAST TECHNIQUE: Multidetector CT imaging of the chest was performed during intravenous contrast administration. RADIATION DOSE REDUCTION: This exam was performed according to the departmental dose-optimization program which includes automated exposure control, adjustment of the mA and/or kV according to patient size and/or use of iterative reconstruction technique. CONTRAST:  79mL OMNIPAQUE IOHEXOL 300 MG/ML  SOLN COMPARISON:  CT chest, 11/14/2021, PET-CT, 08/13/2021 FINDINGS: Cardiovascular: No significant vascular findings. Normal heart size. No pericardial effusion. Mediastinum/Nodes: Unchanged post treatment appearance of matted soft tissue in the anterior mediastinum (series 2, image 58) as well as in the pretracheal and subcarinal stations (series 2, image 55). No persistently enlarged lymph nodes in the chest. Thyroid gland, trachea, and esophagus demonstrate no significant findings. Lungs/Pleura: Slight interval increase in mild bilateral paramedian consolidation and fibrosis, with improvement of adjacent ground-glass airspace  opacity (series 3, image 53). No pleural effusion or pneumothorax. Upper Abdomen: No acute abnormality.  Status post cholecystectomy. Musculoskeletal: No chest wall abnormality. No acute osseous findings. IMPRESSION: 1. Unchanged post treatment appearance of matted soft tissue in the anterior mediastinum as well as in the pretracheal and subcarinal stations. No persistently enlarged lymph nodes in the chest. 2. Expected interval evolution of bilateral paramedian radiation fibrosis and pneumonitis. Electronically Signed   By: Delanna Ahmadi M.D.   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   PLAN:    1.  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 PET scans results from August 13, 2021 reviewed independently with resolution of disease.  Her most recent imaging with CT scan on June 01, 2022 reviewed independently and reported as above with no evidence of recurrent or progressive disease.  No intervention is needed at this time.  Return to clinic in 6 months with repeat imaging and further evaluation.  Once patient is 2 years removed from completing treatment, can consider transitioning to yearly imaging.  2.  Flank pain: Resolved. 3.  Right arm DVT: Resolved.  Eliquis has been discontinued and port removed.  4.  Anemia: Resolved. 5.  Elevated total bilirubin:  Mild, monitor.  I spent a total of 20 minutes reviewing chart data, face-to-face evaluation with the patient, counseling and coordination of care as detailed above.   Patient expressed understanding and was in agreement with this plan. She also understands that She can call clinic at any time with any questions, concerns, or complaints.    Cancer Staging  Mediastinal (thymic) large B-cell lymphoma ([place]) Staging form: Hodgkin and Non-Hodgkin  Lymphoma, AJCC 8th Edition - Clinical stage from 11/13/2020: Stage II bulky (Diffuse large B-cell lymphoma) - Signed by Lloyd Huger, MD on 11/13/2020   Lloyd Huger, MD   06/12/2022 1:15 PM

## 2022-06-11 ENCOUNTER — Encounter: Payer: Self-pay | Admitting: Oncology

## 2022-06-11 ENCOUNTER — Inpatient Hospital Stay: Payer: PRIVATE HEALTH INSURANCE | Attending: Oncology | Admitting: Oncology

## 2022-06-11 VITALS — BP 112/79 | HR 108 | Temp 98.9°F | Ht 65.0 in | Wt 240.0 lb

## 2022-06-11 DIAGNOSIS — C8520 Mediastinal (thymic) large B-cell lymphoma, unspecified site: Secondary | ICD-10-CM | POA: Diagnosis present

## 2022-06-11 DIAGNOSIS — Z9221 Personal history of antineoplastic chemotherapy: Secondary | ICD-10-CM | POA: Diagnosis not present

## 2022-06-11 DIAGNOSIS — Z923 Personal history of irradiation: Secondary | ICD-10-CM | POA: Diagnosis not present

## 2022-06-11 DIAGNOSIS — Z79899 Other long term (current) drug therapy: Secondary | ICD-10-CM | POA: Diagnosis not present

## 2022-06-11 DIAGNOSIS — R748 Abnormal levels of other serum enzymes: Secondary | ICD-10-CM | POA: Insufficient documentation

## 2022-06-11 IMAGING — MR MR HEAD WO/W CM
15 series · 48 of 48 positions shown · IV contrast (9ml Gadavist)
Comparison: None.

CLINICAL DATA: Staging of recently diagnosed lung cancer.

EXAM:
MRI HEAD WITHOUT AND WITH CONTRAST
TECHNIQUE: Multiplanar, multiecho pulse sequences of the brain and surrounding
structures were obtained without and with intravenous contrast.
CONTRAST:  9mL GADAVIST GADOBUTROL 1 MMOL/ML IV SOLN

[Series 5: ax dwi_tracew · axial · 3.0mm · 0.60mm/px · z∈[-84,+70]mm · 4 of 48 slices shown]
[im 1/48]
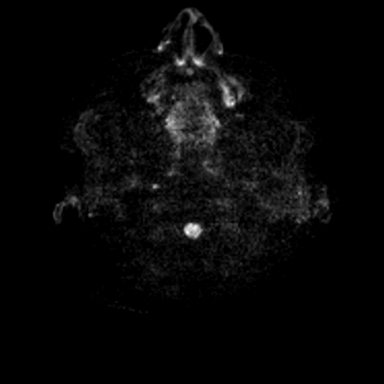
[im 16/48]
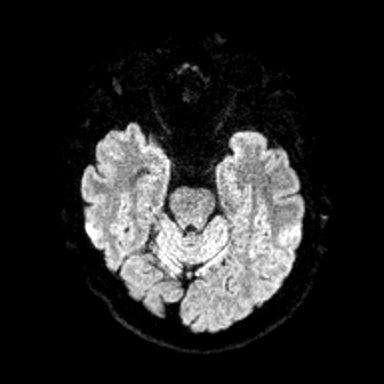
[im 32/48]
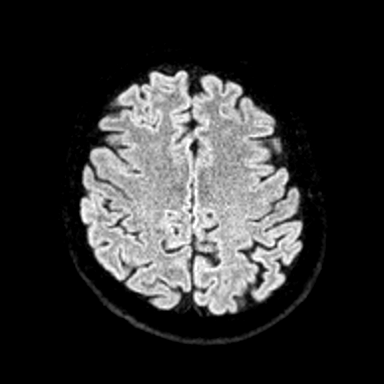
[im 48/48]
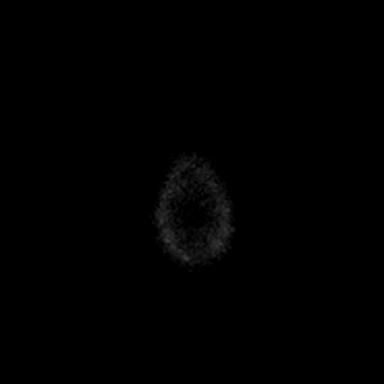

[Series 6: ax dwi_adc · axial · 3.0mm · 0.60mm/px · z∈[-84,+70]mm · 3 of 48 slices shown]
[im 1/48]
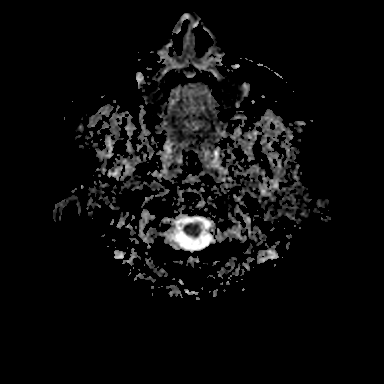
[im 24/48]
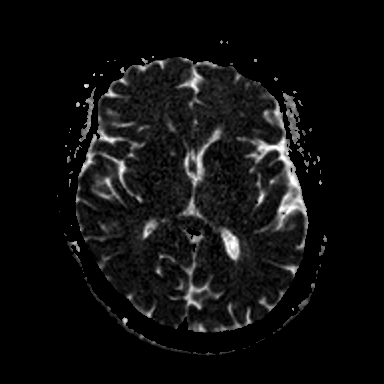
[im 48/48]
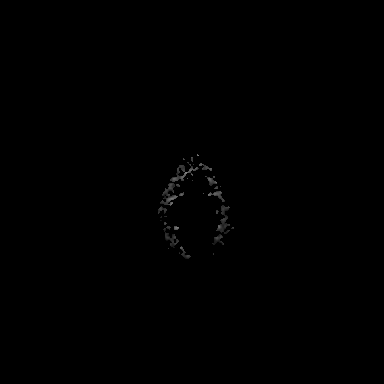

[Series 7: cor dwi_tracew · coronal · 5.0mm · 0.60mm/px · 2 of 40 slices shown]
[im 1/40]
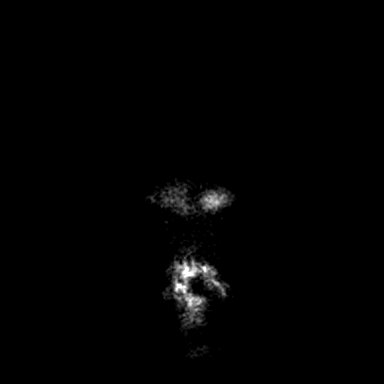
[im 40/40]
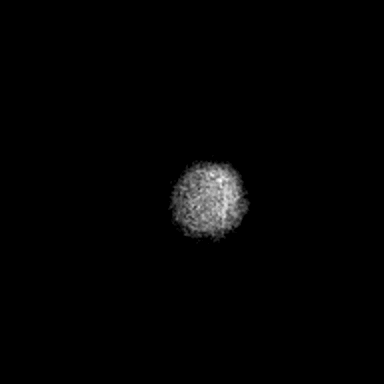

[Series 8: cor dwi_adc · coronal · 5.0mm · 0.60mm/px · 2 of 39 slices shown]
[im 1/39]
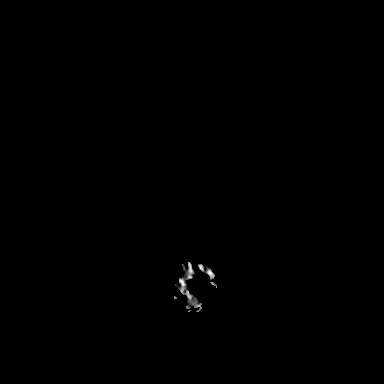
[im 39/39]
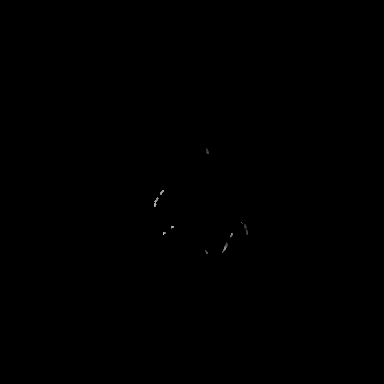

[Series 9: T1 · sagittal · 5.0mm · 0.62mm/px · 1 of 24 slices shown (1 of 2)]
[im 1/24]
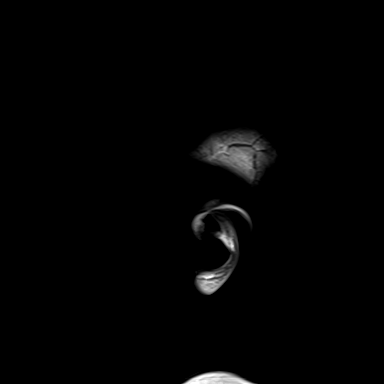

[Series 10: T2 · axial · 5.0mm · 0.53mm/px · 1 of 27 slices shown]
[im 1/27]
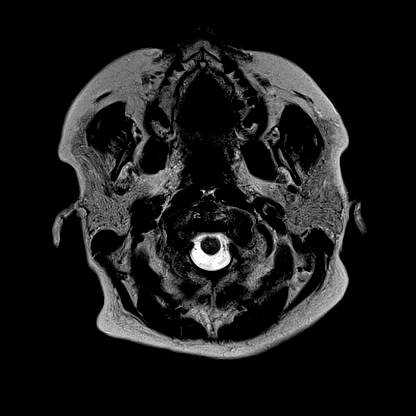

[Series 11: mag_images · axial · 3.0mm · 0.90mm/px · z∈[-100,+75]mm · 3 of 60 slices shown]
[im 1/60]
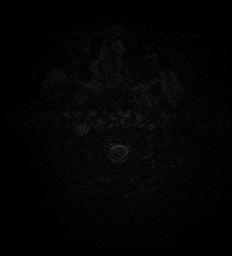
[im 30/60]
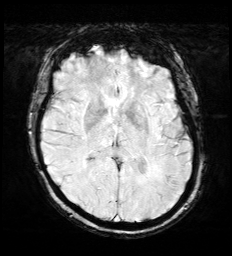
[im 60/60]
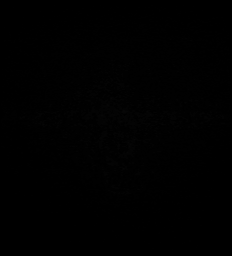

[Series 12: pha_images · axial · 3.0mm · 0.90mm/px · z∈[-100,+75]mm · 3 of 59 slices shown]
[im 1/59]
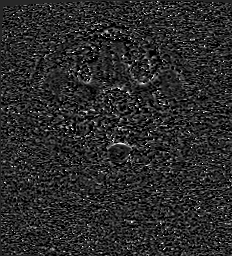
[im 30/59]
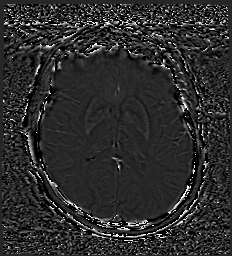
[im 59/59]
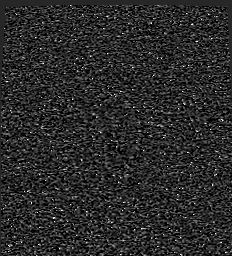

[Series 13: swi_images · axial · 3.0mm · 0.90mm/px · z∈[-100,+75]mm · 3 of 60 slices shown]
[im 1/60]
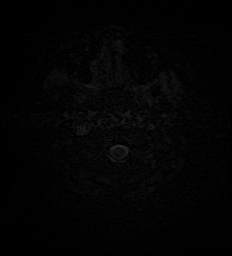
[im 30/60]
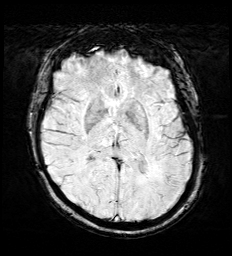
[im 60/60]
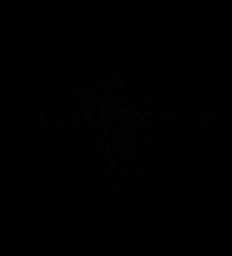

[Series 15: FLAIR · axial · 3.0mm · 0.53mm/px · z∈[-91,+68]mm · 3 of 55 slices shown]
[im 1/55]
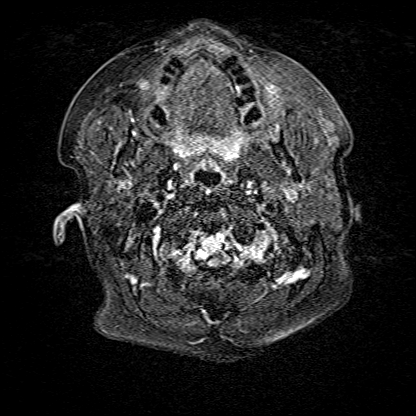
[im 28/55]
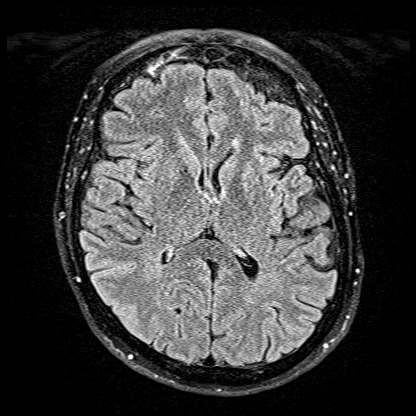
[im 55/55]
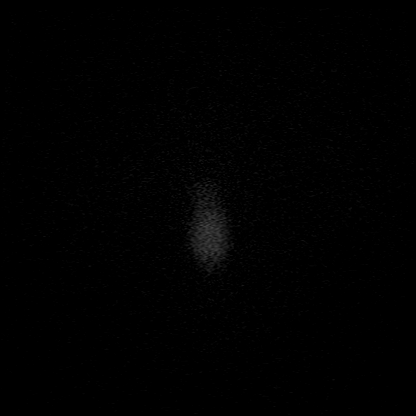

[Series 16: T1 · axial · 1.0mm · 0.98mm/px · z∈[-95,+78]mm · 9 of 174 slices shown (2 of 2)]
[im 1/174]
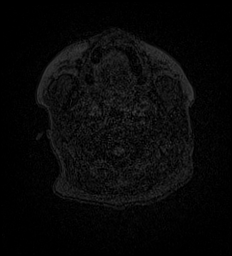
[im 22/174]
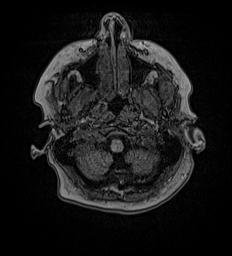
[im 44/174]
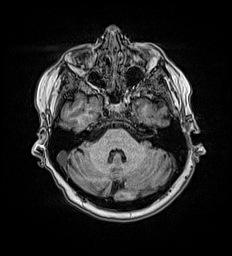
[im 65/174]
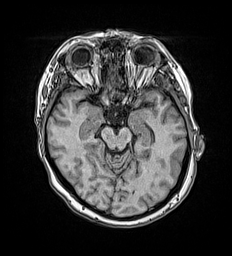
[im 87/174]
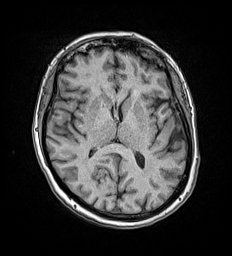
[im 109/174]
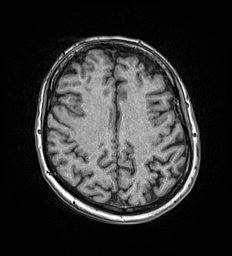
[im 130/174]
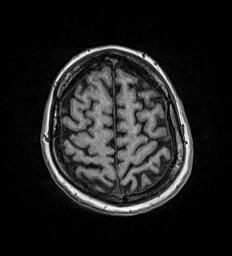
[im 152/174]
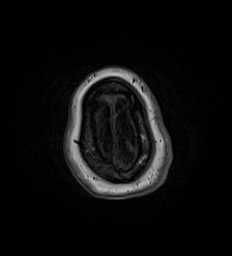
[im 174/174]
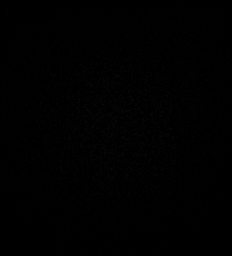

[Series 17: T2 post-contrast · coronal · 5.0mm · 0.57mm/px · 2 of 29 slices shown]
[im 1/29]
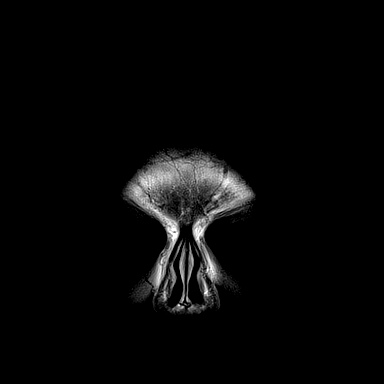
[im 29/29]
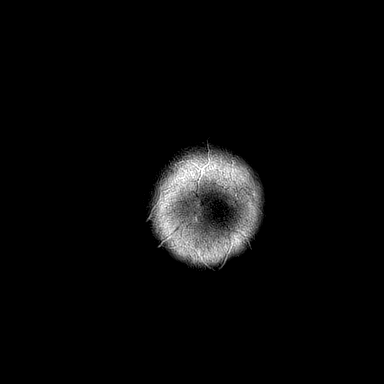

[Series 18: T1 post-contrast · axial · 1.0mm · 0.98mm/px · z∈[-95,+78]mm · 9 of 175 slices shown (1 of 3)]
[im 1/175]
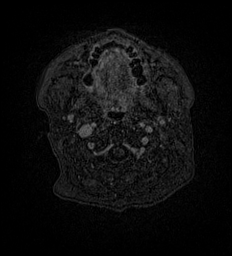
[im 22/175]
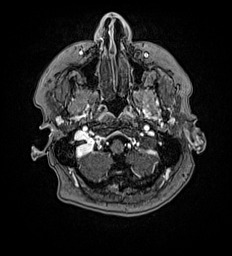
[im 44/175]
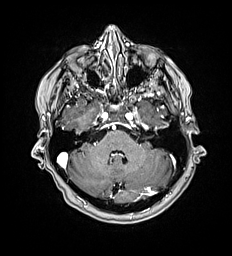
[im 66/175]
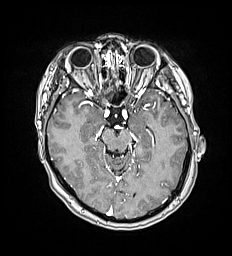
[im 88/175]
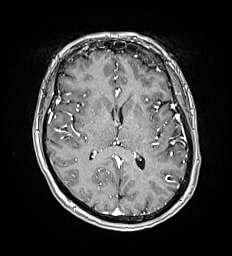
[im 109/175]
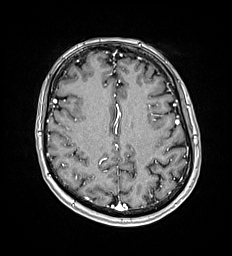
[im 131/175]
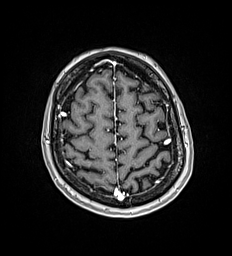
[im 153/175]
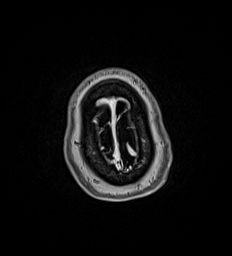
[im 175/175]
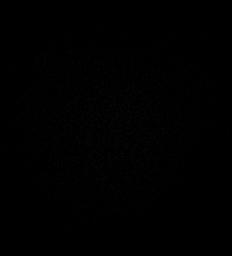

[Series 19: T1 post-contrast · coronal · 5.0mm · 0.57mm/px · 2 of 29 slices shown (2 of 3)]
[im 1/29]
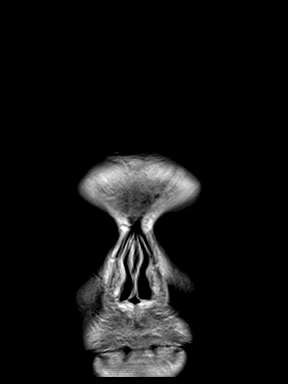
[im 29/29]
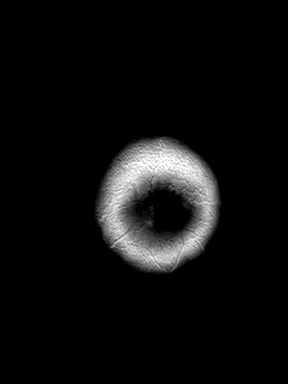

[Series 20: T1 post-contrast · sagittal · 5.0mm · 0.62mm/px · 1 of 24 slices shown (3 of 3)]
[im 1/24]
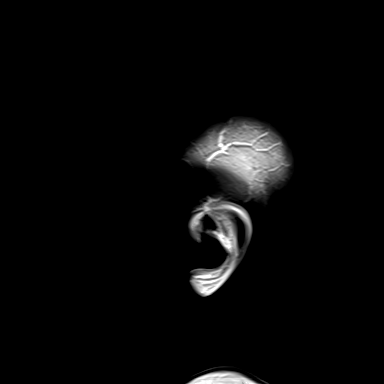

[48 of 48 positions shown; findings below may reference images not displayed]

FINDINGS: Brain: There is no evidence of an acute infarct, intracranial
hemorrhage, mass, midline shift, or extra-axial fluid collection.
The ventricles and sulci are normal. A few punctate foci of T2
hyperintensity in the cerebral white matter are nonspecific and
considered to be within normal limits for age. No abnormal
enhancement is identified.

Vascular: Major intracranial vascular flow voids are preserved.

Skull and upper cervical spine: Unremarkable bone marrow signal.

Sinuses/Orbits: Unremarkable orbits. Clear paranasal sinuses. Small
bilateral mastoid effusions.

Other: None.
IMPRESSION: Unremarkable appearance of the brain for age. No evidence of
intracranial metastases.

## 2022-06-12 IMAGING — US US THORACENTESIS ASP PLEURAL SPACE W/IMG GUIDE
1 series · 4 of 4 positions shown · non-contrast
Comparison: none

INDICATION: 54-year-old with mediastinal mass and recurrent right pleural
effusion.

[Series 1: us thoracentesis asp pleural s · 4 of 4 slices shown]
[im 1/4]
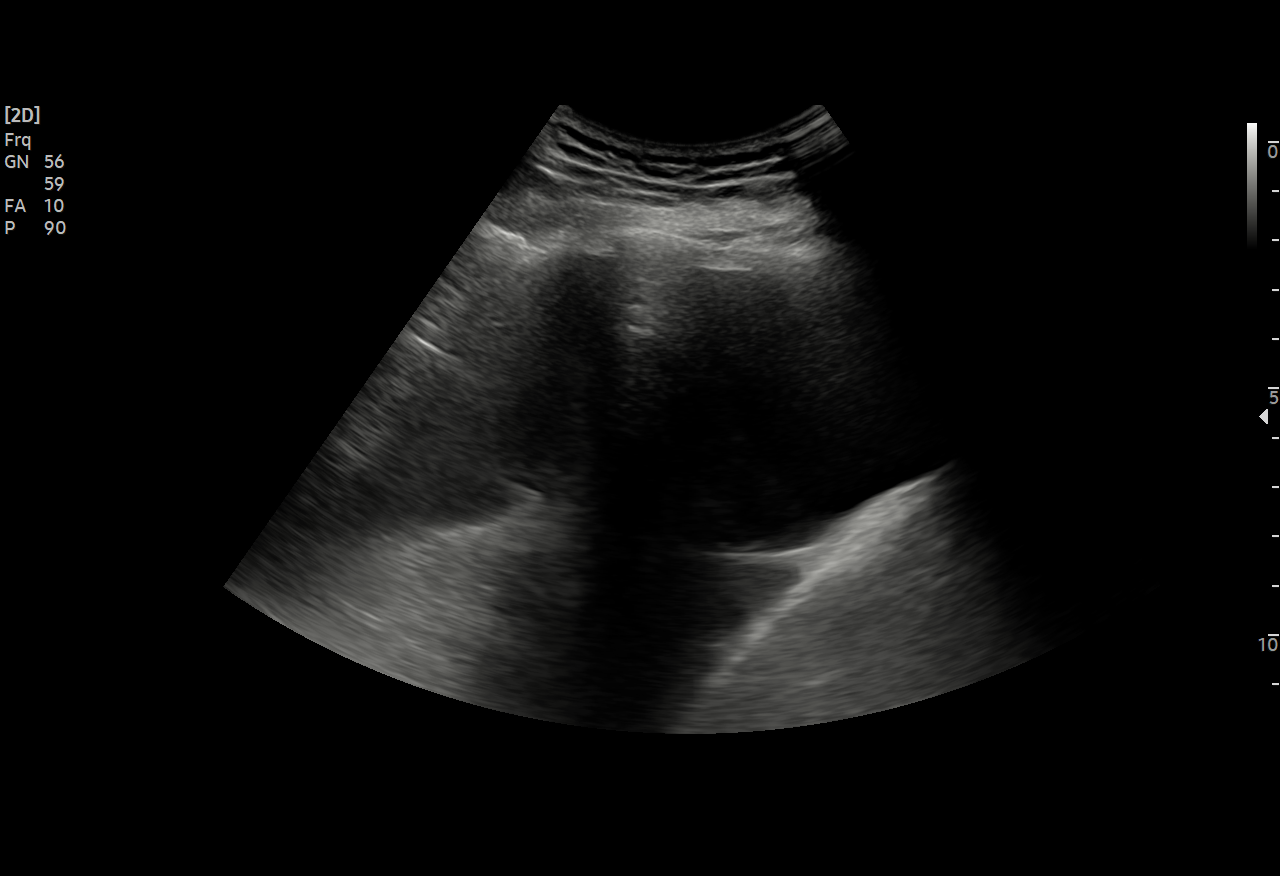
[im 2/4]
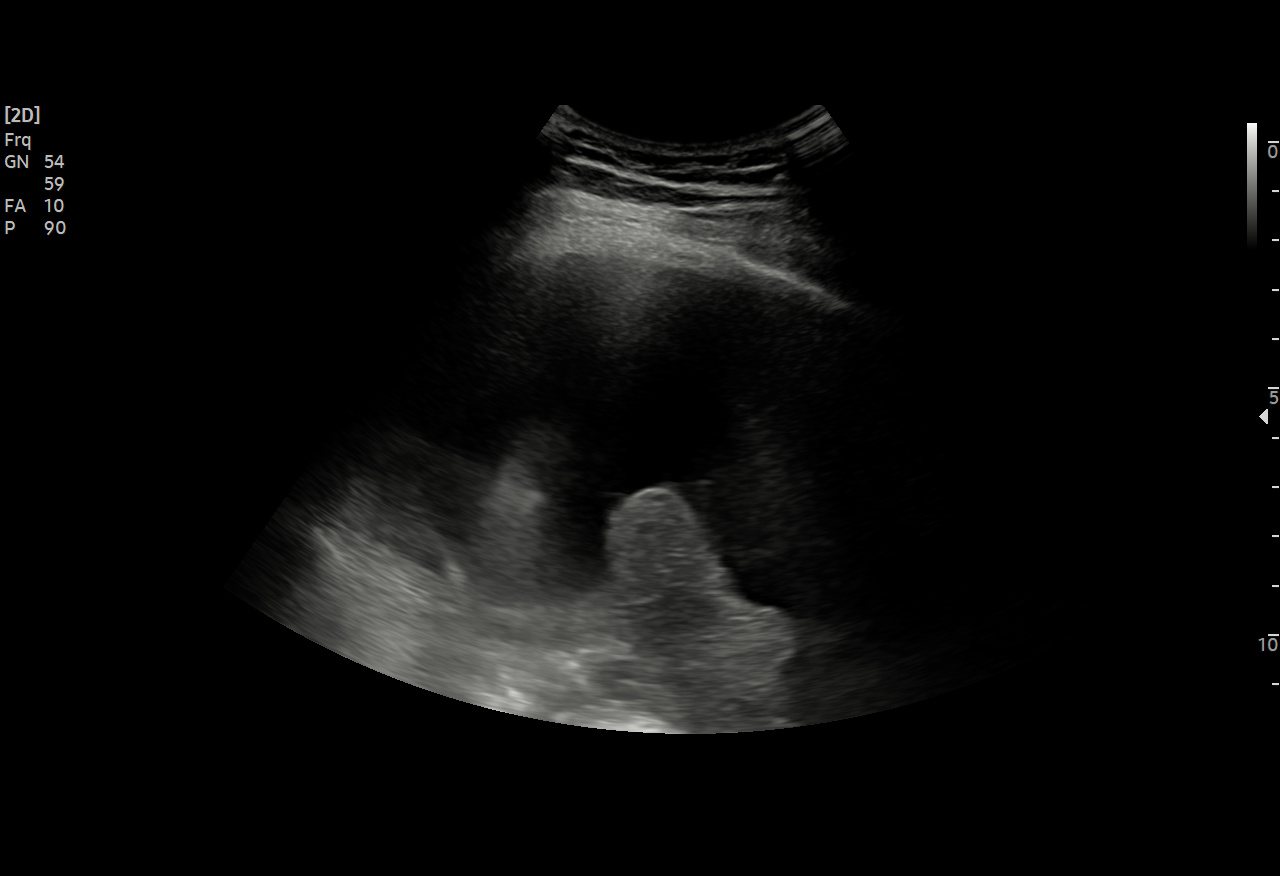
[im 3/4]
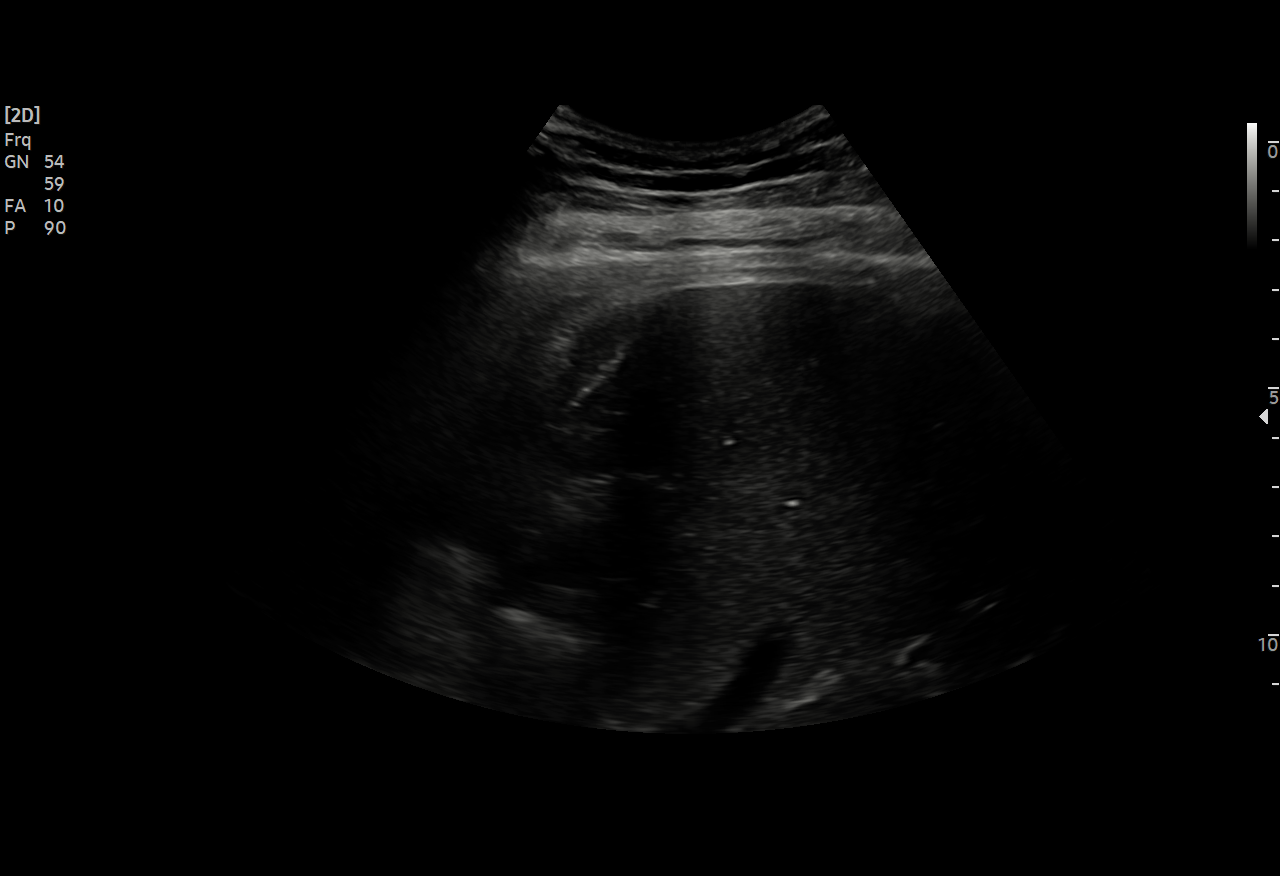
[im 4/4]
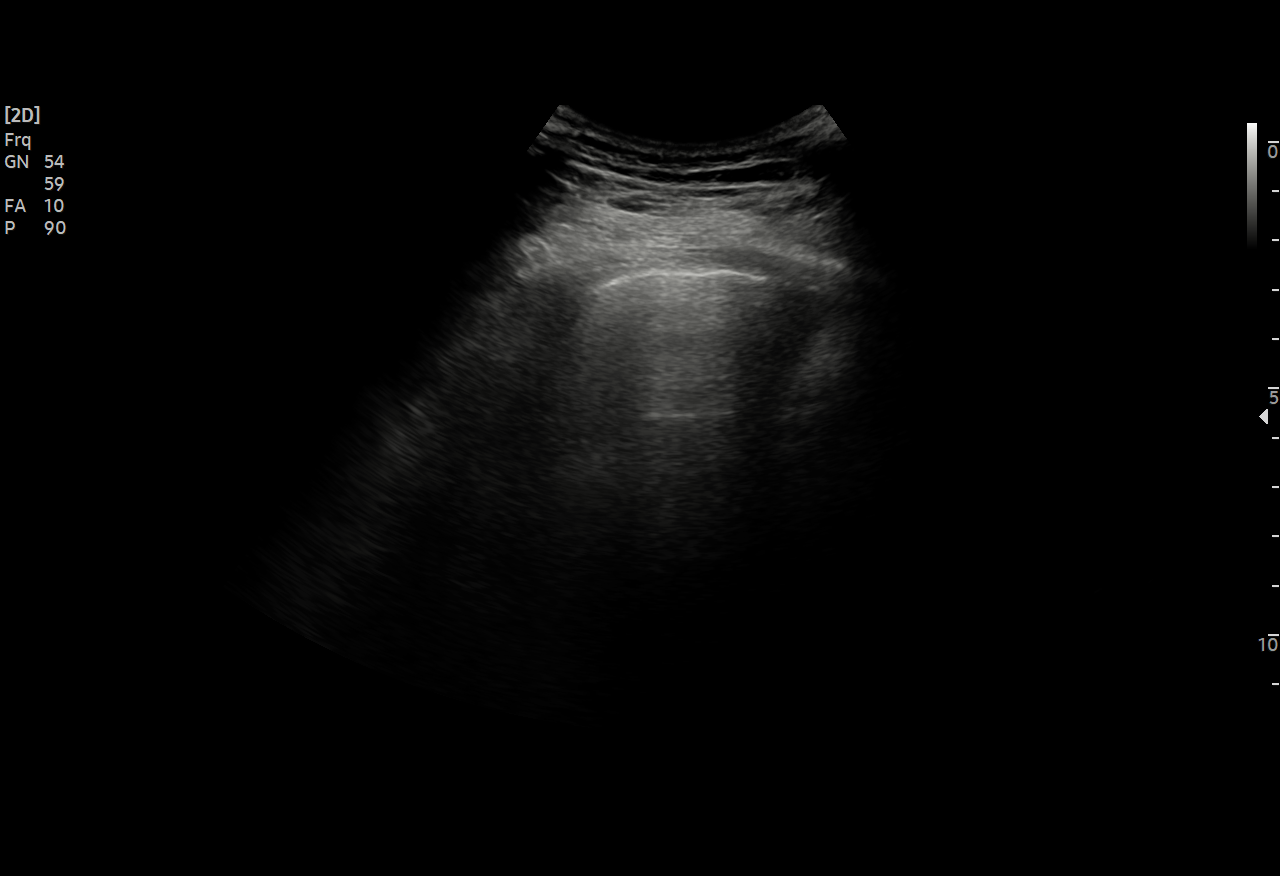

[4 of 4 positions shown; findings below may reference images not displayed]

EXAM:
ULTRASOUND GUIDED RIGHT THORACENTESIS

MEDICATIONS:
None.

COMPLICATIONS:
None immediate.

PROCEDURE:
An ultrasound guided thoracentesis was thoroughly discussed with the
patient and questions answered. The benefits, risks, alternatives
and complications were also discussed. The patient understands and
wishes to proceed with the procedure. Written consent was obtained.

Ultrasound was performed to localize and mark an adequate pocket of
fluid in the right chest. The area was then prepped and draped in
the normal sterile fashion. 1% Lidocaine was used for local
anesthesia. Under ultrasound guidance a 6 Fr Safe-T-Centesis
catheter was introduced. Thoracentesis was performed. The catheter
was removed and a dressing applied.
FINDINGS: A total of approximately 1.3 L of yellow fluid was removed.
IMPRESSION: Successful ultrasound guided right thoracentesis yielding 1.3 L of
pleural fluid.

## 2022-06-17 IMAGING — DX DG CHEST 1V PORT
1 series · 1 of 1 positions shown · non-contrast
Comparison: October 30, 2020.

CLINICAL DATA: Status post thoracentesis

EXAM:
PORTABLE CHEST 1 VIEW

[chest ap]
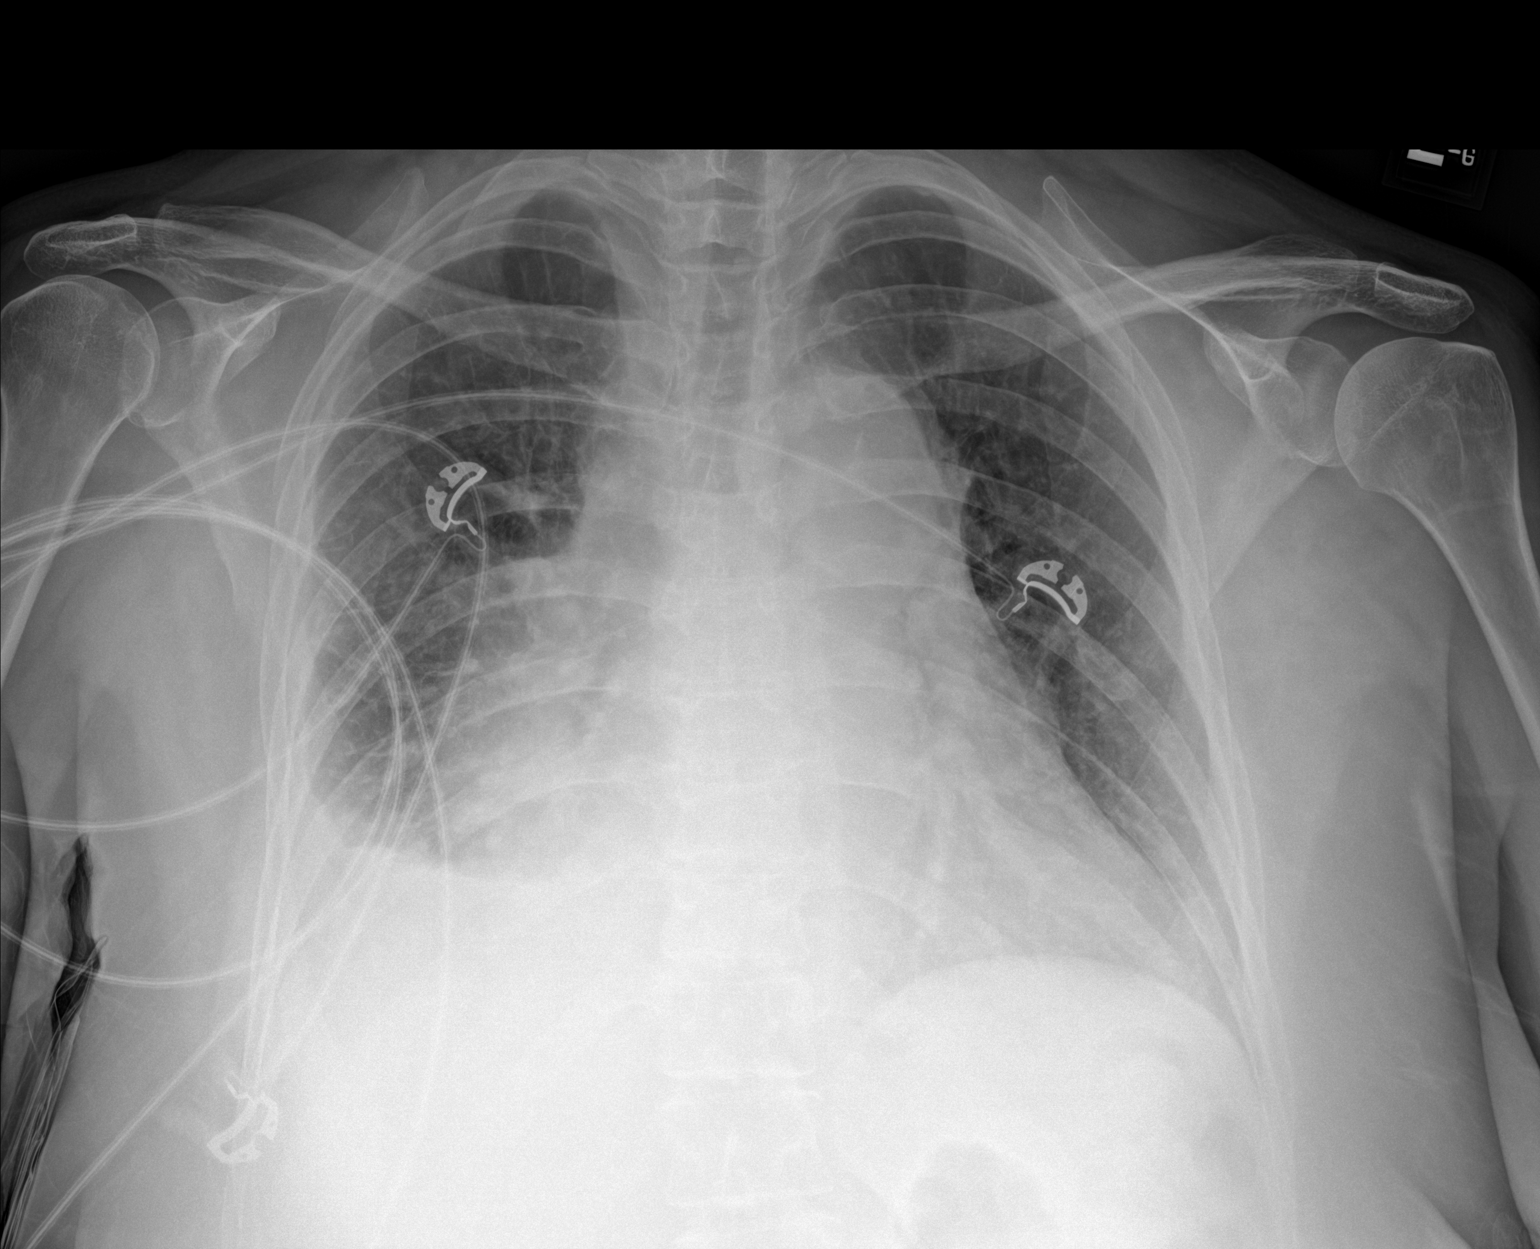

[1 of 1 positions shown; findings below may reference images not displayed]

FINDINGS: No pneumothorax evident. Small right pleural effusion evident. There
is mild right base atelectasis. The lungs elsewhere are clear. There
is stable cardiomegaly with pulmonary vascularity normal. No
adenopathy. No bone lesions.
IMPRESSION: No appreciable pneumothorax. Small right pleural effusion with right
base atelectasis. No consolidation. Stable cardiomegaly.

## 2022-06-22 IMAGING — CR DG CHEST 2V
2 series · 2 of 2 positions shown · non-contrast
Comparison: [DATE] [DATE], [DATE], [DATE] [DATE], [DATE]

CLINICAL DATA: Shortness of breath.  History of lung cancer

EXAM:
CHEST - 2 VIEW

[chest pa]
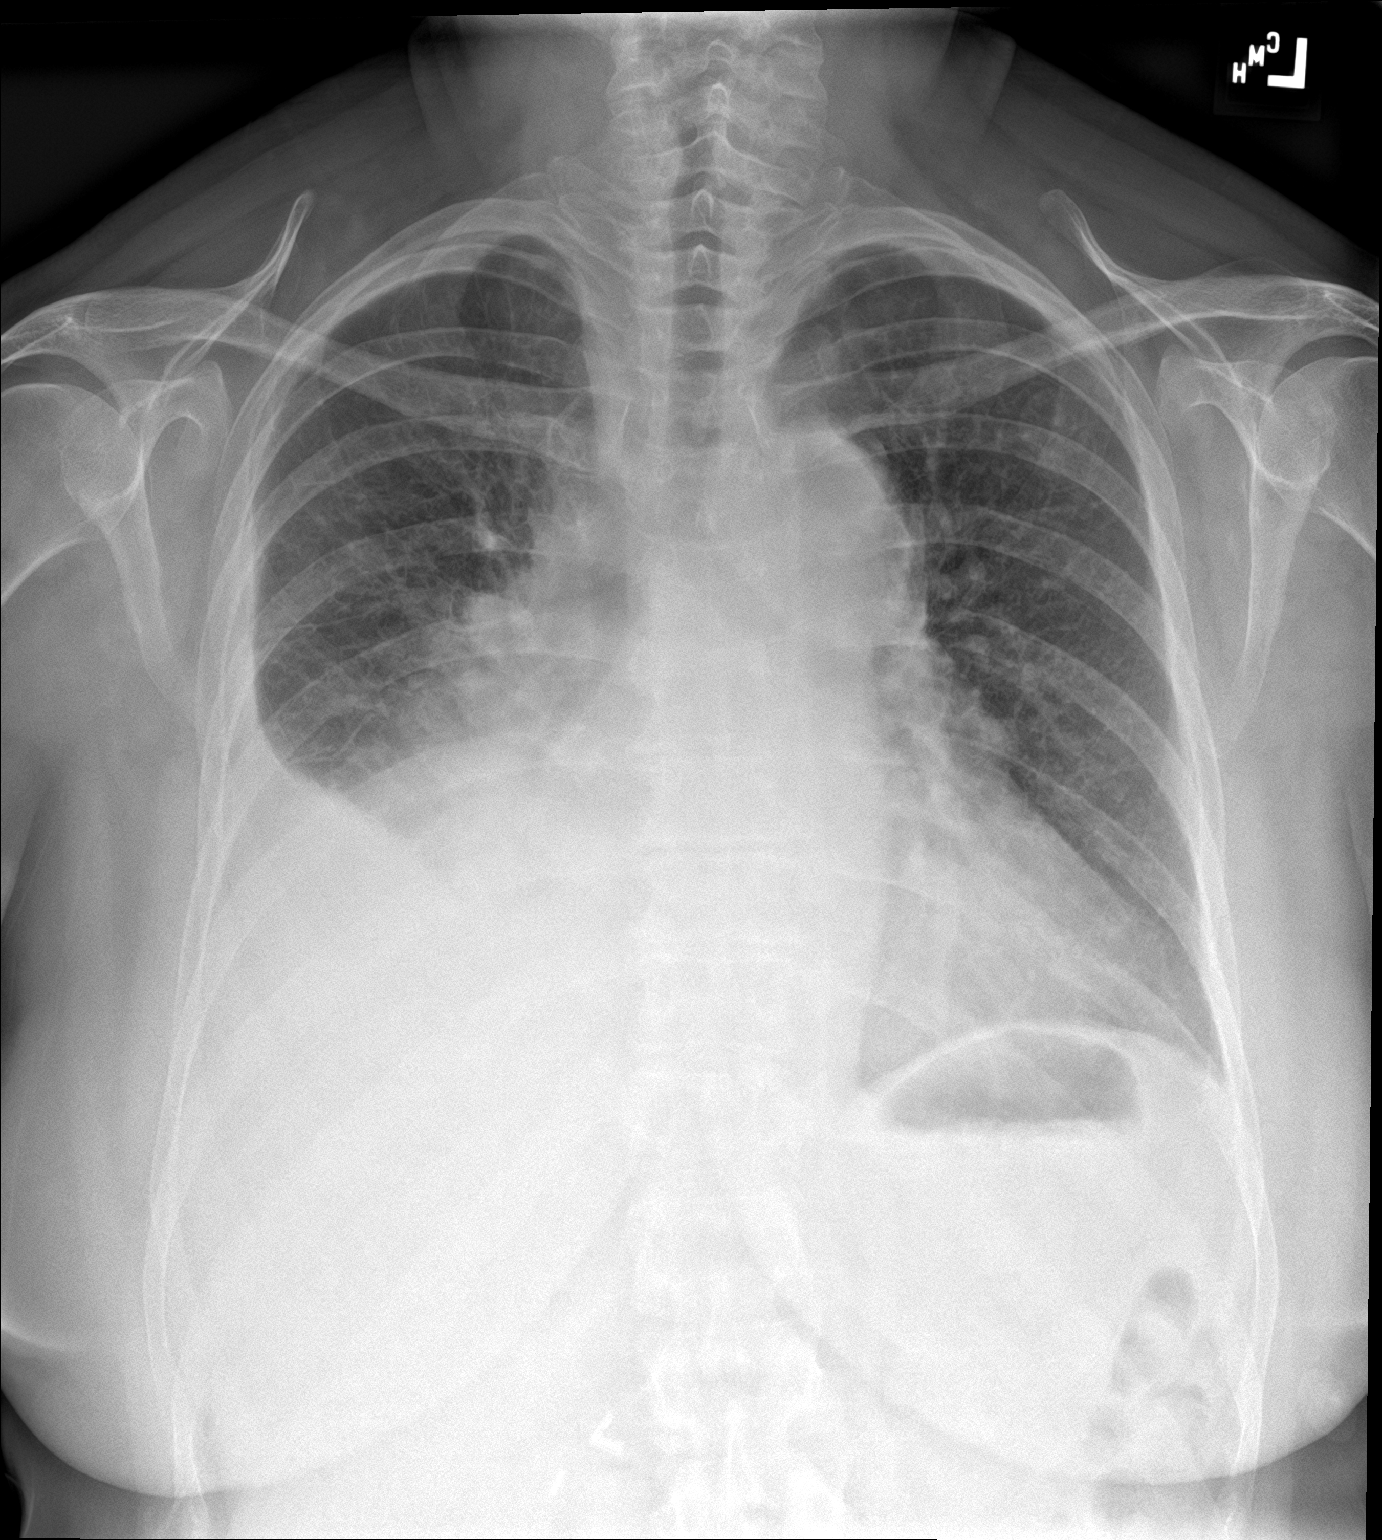

[chest lat]
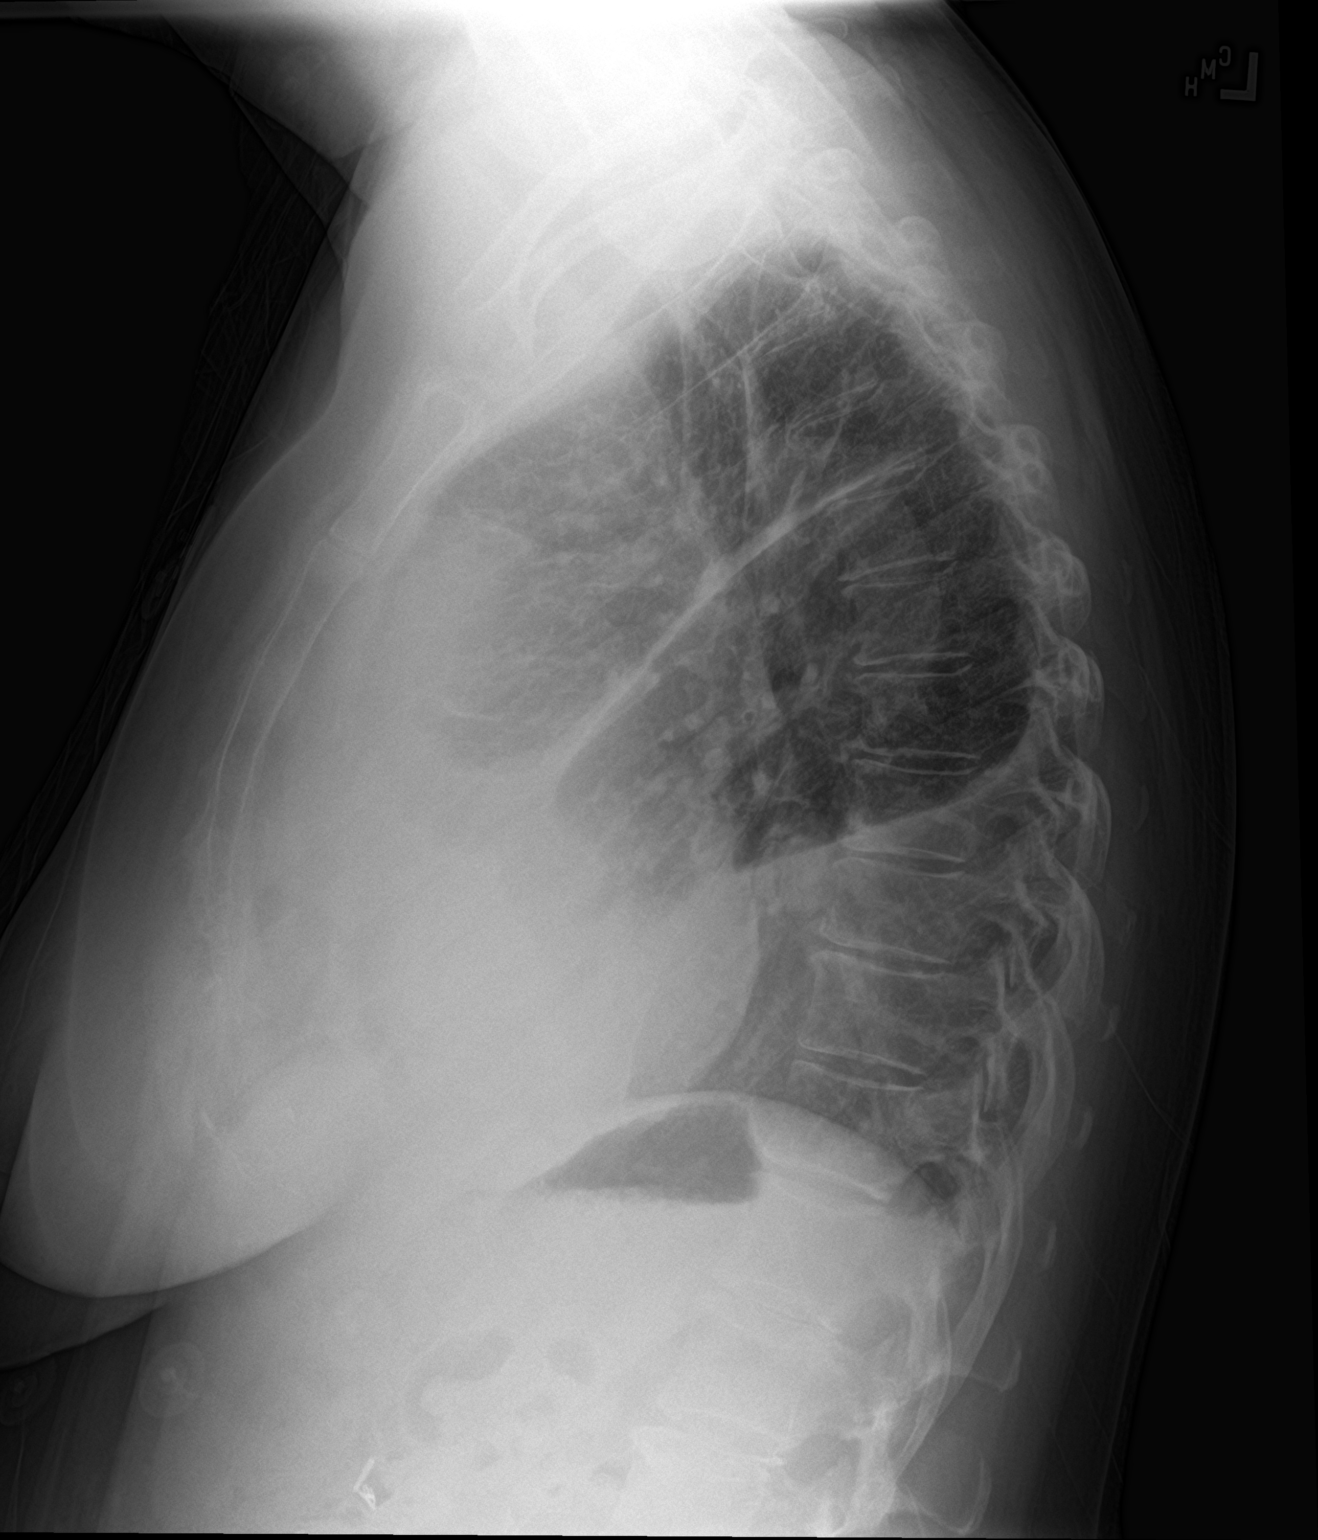

[2 of 2 positions shown; findings below may reference images not displayed]

FINDINGS: The cardiomediastinal silhouette is unchanged in contour with
unchanged irregular RIGHT perihilar contours and opacity of the
anterior mediastinum.There is a moderate RIGHT pleural effusion,
increased in comparison to prior. Trace LEFT pleural effusion. No
pneumothorax. Homogeneous opacification of the RIGHT lung base,
likely atelectasis. Status post cholecystectomy. Mild degenerative
changes of the thoracic spine.
IMPRESSION: 1. Interval increase in size of a moderate RIGHT pleural effusion.
2. Revisualization of malignancy involving the anterior mediastinum
and extending along the RIGHT perihilar border.

## 2022-07-13 ENCOUNTER — Encounter (INDEPENDENT_AMBULATORY_CARE_PROVIDER_SITE_OTHER): Payer: Self-pay

## 2022-09-11 ENCOUNTER — Ambulatory Visit (HOSPITAL_COMMUNITY)
Admission: EM | Admit: 2022-09-11 | Discharge: 2022-09-11 | Disposition: A | Discharge status: Home / Self Care | Payer: Medicaid Other | Attending: Emergency Medicine | Admitting: Emergency Medicine

## 2022-09-11 ENCOUNTER — Encounter (HOSPITAL_COMMUNITY): Payer: Self-pay | Admitting: Emergency Medicine

## 2022-09-11 ENCOUNTER — Ambulatory Visit (INDEPENDENT_AMBULATORY_CARE_PROVIDER_SITE_OTHER): Payer: Medicaid Other

## 2022-09-11 DIAGNOSIS — S92511A Displaced fracture of proximal phalanx of right lesser toe(s), initial encounter for closed fracture: Secondary | ICD-10-CM

## 2022-09-11 DIAGNOSIS — M79671 Pain in right foot: Secondary | ICD-10-CM | POA: Diagnosis not present

## 2022-09-11 DIAGNOSIS — S92404A Nondisplaced unspecified fracture of right great toe, initial encounter for closed fracture: Secondary | ICD-10-CM | POA: Diagnosis not present

## 2022-09-11 DIAGNOSIS — M7989 Other specified soft tissue disorders: Secondary | ICD-10-CM | POA: Diagnosis not present

## 2022-09-11 MED ORDER — TRAMADOL HCL 50 MG PO TABS: 50.0000 mg | ORAL_TABLET | Freq: Four times a day (QID) | ORAL | 0 refills | Status: DC | PRN

## 2022-09-11 NOTE — ED Provider Notes (Signed)
[facility]    CSN: [id number] Arrival date & time: 09/11/22  1544      History   Chief Complaint Chief Complaint  Patient presents with   Toe Injury    HPI Jennifer Cole is a 56 y.o. female.  Patient presents complaining of right foot second toe injury that occurred 3 days ago.  She stated she was walking in the dark within her house when she hit her second toe on her right foot against a chair.  Patient reports increased pain with ambulation and at night.  Patient states that she has noticed bruising to the affected area.  Patient denies any numbness, tingling, or changes to sensation.  Patient reports that she has had a previous surgery on her right foot where metal has been placed.  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   Recurrent right pleural effusion 11/09/2020   Mass of right lung 10/25/2020   Callus of foot 06/29/2019   Metatarsalgia, left foot 06/29/2019   Mechanical complic of internal orthopedic device, implant or graft ([place]) 01/04/2019   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   Hammer toe of right foot 06/06/2018   Foot pain, right 06/06/2018   Chronic pain of both knees 03/21/2018   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   Hallux valgus (acquired), right foot 01/24/2018   Hallux rigidus of right foot 01/24/2018   Metatarsalgia of right foot 01/24/2018   Moderate episode of recurrent major depressive disorder ([place]) 11/04/2017   History of cervical cancer 11/01/2017    Past Surgical History:  Procedure Laterality Date   ABDOMINAL HYSTERECTOMY      CESAREAN SECTION     CHOLECYSTECTOMY     IR PERC PLEURAL DRAIN W/INDWELL CATH W/IMG GUIDE  11/11/2020   IR REMOVAL OF PLURAL CATH W/CUFF  12/13/2020   IR REMOVAL TUN ACCESS W/ PORT W/O FL MOD SED  08/27/2021   PORTA CATH INSERTION N/A 11/18/2020   Procedure: PORTA CATH INSERTION;  Surgeon: Algernon Huxley, MD;  Location: [facility];  Service: Cardiovascular;  Laterality: N/A;    OB History     Gravida  4   Para  3   Term  3   Preterm      AB  1   Living  3      SAB      IAB      Ectopic      Multiple      Live Births  3            Home Medications    Prior to Admission medications   Medication Sig Start Date End Date Taking? Authorizing Provider  traMADol (ULTRAM) 50 MG tablet Take 1 tablet (50 mg total) by mouth every 6 (six) hours as needed. 09/11/22  Yes Flossie Dibble, NP    Family History Family History  Problem Relation Age of Onset   Breast cancer Neg Hx    Ovarian cancer Neg Hx    Colon cancer Neg Hx    Diabetes Neg Hx     Social History Social History  Tobacco Use   Smoking status: Never   Smokeless tobacco: Never  Substance Use Topics   Alcohol use: Never   Drug use: Never     Allergies   Patient has no known allergies.   Review of Systems Review of Systems Per HPI  Physical Exam Triage Vital Signs ED Triage Vitals  Enc Vitals Group     BP 09/11/22 1845 128/87     Pulse Rate 09/11/22 1845 99     Resp 09/11/22 1845 18     Temp 09/11/22 1845 (!) 97.5 F (36.4 C)     Temp Source 09/11/22 1845 Oral     SpO2 09/11/22 1845 96 %     Weight --      Height --      Head Circumference --      Peak Flow --      Pain Score 09/11/22 1842 0     Pain Loc --      Pain Edu? --      Excl. in [place]? --    No data found.  Updated Vital Signs BP 128/87 (BP Location: Right Arm)   Pulse 99   Temp (!) 97.5 F (36.4 C) (Oral)   Resp 18   SpO2 96%   Physical Exam Vitals and nursing note reviewed.  Cardiovascular:      Pulses:          Dorsalis pedis pulses are 2+ on the right side.       Posterior tibial pulses are 2+ on the right side.  Musculoskeletal:     Right foot: Decreased range of motion. Normal capillary refill. Swelling, deformity (To 2nd toe), tenderness (to 2nd toe) and bony tenderness (to 2nd toe on dorsal and palmar side) present. Normal pulse.     Comments: Bruising present to palmar side of RT foot. Difficulty with flexion and extension of 2nd toe on RT foot.       UC Treatments / Results  Labs (all labs ordered are listed, but only abnormal results are displayed) Labs Reviewed - No data to display  EKG   Radiology DG Foot Complete Right  Result Date: 09/11/2022 CLINICAL DATA:  Second toe pain injury EXAM: RIGHT FOOT COMPLETE - 3+ VIEW COMPARISON:  None Available. FINDINGS: Large plantar calcaneal spur. Fixating screws across the first MTP joint with bone fusion. Fracture through 1 of the fixating screws. Small surgical device head of second metatarsal. Acute mildly displaced fracture head of second proximal phalanx. No subluxation. Positive for soft tissue swelling. Possible old deformity fifth metatarsal. IMPRESSION: 1. Acute mildly displaced fracture involving the head of the second proximal phalanx. 2. Postsurgical changes at the first MTP joint with fracture through 1 of the fixating screws. Electronically Signed   By: Donavan Foil M.D.   On: 09/11/2022 19:39    Procedures Procedures (including critical care time)  Medications Ordered in UC Medications - No data to display  Initial Impression / Assessment and Plan / UC Course  I have reviewed the triage vital signs and the nursing notes.  Pertinent labs & imaging results that were available during my care of the patient were reviewed by me and considered in my medical decision making (see chart for details).     Patient was evaluated and for toe injury.  Right foot x-ray was performed.  Foot x-ray showed displaced  fracture of the proximal phalanx of right second toe and a fracture of the right foot greater toe first MTP joint where  patient has previous operation.  Patient was made aware that she will need to schedule follow-up with her orthopedist.  Patient was made aware of other offices that she could visit but she states that she wants to follow-up with the orthopedist that she knows.  Patient was given a postop shoe in office.  She was made aware of symptom management.  Tramadol given for severe pain and nighttime use, patient was made aware of safety precautions with tramadol.  Patient verbalized understanding of instructions.  Charting was provided using a a verbal dictation system, charting was proofread for errors, errors may occur which could change the meaning of the information charted.   Final Clinical Impressions(s) / UC Diagnoses   Final diagnoses:  Displaced fracture of proximal phalanx of right lesser toe(s), initial encounter for closed fracture  Closed nondisplaced fracture of phalanx of right great toe, unspecified phalanx, initial encounter     Discharge Instructions      We placed a postop shoe on your foot while in office, keep this on your foot while walking around until seeing your orthopedist.   Tramadol has been sent to the pharmacy for pain management, try to reserve this for nighttime use, please be mindful not to operate any heavy machinery or drive a car after taking this medicine.  You will need to call and schedule appointment with your orthopedist for follow-up.   Rest, ice, compression, and elevation can help with symptom management.  It will help you to rest the affected area.  Use ice to help with inflammation at the site of injury.  You may use a compression bandage or sleeve to provide support for the affected area.  Providing elevation for the affected extremity when able can aid healing.      ED Prescriptions     Medication Sig Dispense Auth. Provider    traMADol (ULTRAM) 50 MG tablet Take 1 tablet (50 mg total) by mouth every 6 (six) hours as needed. 15 tablet Flossie Dibble, NP      I have reviewed the PDMP during this encounter.   Flossie Dibble, NP 09/12/22 1010

## 2022-09-11 NOTE — ED Triage Notes (Signed)
Pt reports right second toe pain after hitting it on a chair 3 days ago. Toe is bruised and painful to touch,

## 2022-09-11 NOTE — Discharge Instructions (Addendum)
We placed a postop shoe on your foot while in office, keep this on your foot while walking around until seeing your orthopedist.   Tramadol has been sent to the pharmacy for pain management, try to reserve this for nighttime use, please be mindful not to operate any heavy machinery or drive a car after taking this medicine.  You will need to call and schedule appointment with your orthopedist for follow-up.   Rest, ice, compression, and elevation can help with symptom management.  It will help you to rest the affected area.  Use ice to help with inflammation at the site of injury.  You may use a compression bandage or sleeve to provide support for the affected area.  Providing elevation for the affected extremity when able can aid healing.

## 2022-11-06 ENCOUNTER — Telehealth: Payer: Self-pay

## 2022-11-06 NOTE — Telephone Encounter (Signed)
Mychart msg sent

## 2022-11-24 ENCOUNTER — Emergency Department
Admission: EM | Admit: 2022-11-24 | Discharge: 2022-11-24 | Disposition: A | Discharge status: Home / Self Care | Payer: PRIVATE HEALTH INSURANCE | Attending: Emergency Medicine | Admitting: Emergency Medicine

## 2022-11-24 ENCOUNTER — Emergency Department: Payer: PRIVATE HEALTH INSURANCE

## 2022-11-24 ENCOUNTER — Other Ambulatory Visit: Payer: Self-pay

## 2022-11-24 ENCOUNTER — Encounter: Payer: Self-pay | Admitting: Emergency Medicine

## 2022-11-24 DIAGNOSIS — R0789 Other chest pain: Secondary | ICD-10-CM | POA: Diagnosis not present

## 2022-11-24 DIAGNOSIS — R079 Chest pain, unspecified: Secondary | ICD-10-CM | POA: Diagnosis not present

## 2022-11-24 DIAGNOSIS — R7989 Other specified abnormal findings of blood chemistry: Secondary | ICD-10-CM | POA: Diagnosis not present

## 2022-11-24 LAB — TROPONIN I (HIGH SENSITIVITY)
Troponin I (High Sensitivity): 4 ng/L (ref <18)
Troponin I (High Sensitivity): 4 ng/L (ref <18)

## 2022-11-24 LAB — BASIC METABOLIC PANEL
Anion gap: 8 (ref 5–15)
BUN: 17 mg/dL (ref 6–20)
CO2: 26 mmol/L (ref 22–32)
Calcium: 9.1 mg/dL (ref 8.9–10.3)
Chloride: 105 mmol/L (ref 98–111)
Creatinine, Ser: 0.65 mg/dL (ref 0.44–1.00)
GFR, Estimated: >60 mL/min (ref >60)
Glucose, Bld: 130 mg/dL — ABNORMAL HIGH (ref 70–99)
Potassium: 3.9 mmol/L (ref 3.5–5.1)
Sodium: 139 mmol/L (ref 135–145)

## 2022-11-24 LAB — CBC
HCT: 39.9 % (ref 36.0–46.0)
Hemoglobin: 13.5 g/dL (ref 12.0–15.0)
MCH: 31.1 pg (ref 26.0–34.0)
MCHC: 33.8 g/dL (ref 30.0–36.0)
MCV: 91.9 fL (ref 80.0–100.0)
Platelets: 241 10*3/uL (ref 150–400)
RBC: 4.34 MIL/uL (ref 3.87–5.11)
RDW: 12.9 % (ref 11.5–15.5)
WBC: 6.3 10*3/uL (ref 4.0–10.5)
nRBC: 0 % (ref 0.0–0.2)

## 2022-11-24 LAB — PREGNANCY, URINE: Preg Test, Ur: NEGATIVE

## 2022-11-24 MED ORDER — NAPROXEN 375 MG PO TABS: 375.0000 mg | ORAL_TABLET | Freq: Two times a day (BID) | ORAL | 0 refills | Status: DC

## 2022-11-24 MED ORDER — NAPROXEN 375 MG PO TABS: 375.0000 mg | ORAL_TABLET | Freq: Two times a day (BID) | ORAL | 0 refills | Status: AC

## 2022-11-24 MED ORDER — IOHEXOL 350 MG/ML SOLN
100.0000 mL | Freq: Once | INTRAVENOUS | Status: AC | PRN
  Administered 2022-11-24: 100 mL via INTRAVENOUS

## 2022-11-24 NOTE — ED Notes (Signed)
Patient ambulated to the bathroom for urine sample. Patient does not complain of chest pain at this time. No shortness of breath.

## 2022-11-24 NOTE — ED Triage Notes (Signed)
Pt to ED via POV for Right sided chest pain. Pt states the pain has been there for about week. Pt has hx/o of mass in that area and want to make sure that it is not coming back. Pt is in NAD.

## 2022-11-24 NOTE — ED Provider Notes (Signed)
[facility] Provider Note    Event Date/Time   First MD Initiated Contact with Patient 11/24/22 2004     (approximate)   History   Chest Pain   HPI  Valri Steidley is a 57 y.o. female here with right-sided chest pain.  The patient states that she was at work today when she began experiencing sharp, somewhat positional, stabbing, right-sided chest pain.  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  She received radiation for this.  She states she otherwise has been well.  Denies recent fevers or chills.  No leg swelling.  No recent weight loss or increase in night sweats.  She states that the pain did begin when she was working and she has to pull something towards her at work that is fairly heavy and repetitive weight.     Physical Exam   Triage Vital Signs: ED Triage Vitals  Enc Vitals Group     BP 11/24/22 1810 127/80     Pulse Rate 11/24/22 1810 (!) 115     Resp 11/24/22 1810 17     Temp 11/24/22 1810 99.2 F (37.3 C)     Temp Source 11/24/22 1810 Oral     SpO2 11/24/22 1810 95 %     Weight 11/24/22 1811 240 lb (108.9 kg)     Height 11/24/22 1811 '5\' 6"'$  (1.676 m)     Head Circumference --      Peak Flow --      Pain Score 11/24/22 1824 6     Pain Loc --      Pain Edu? --      Excl. in [place]? --     Most recent vital signs: Vitals:   11/24/22 2034 11/24/22 2230  BP: 122/75 (!) 127/91  Pulse: (!) 105 (!) 103  Resp: 17 17  Temp:  98.2 F (36.8 C)  SpO2: 97% 97%     General: Awake, no distress.  CV:  Good peripheral perfusion.  Resp:  Normal work of breathing.  Lungs clear to auscultation bilaterally.  Normal work of breathing. Abd:  No distention.  Other:  No lower extremity edema or asymmetry.   ED Results / Procedures / Treatments   Labs (all labs ordered are listed, but only abnormal results are displayed) Labs Reviewed  BASIC METABOLIC PANEL -  Abnormal; Notable for the following components:      Result Value   Glucose, Bld 130 (*)    All other components within normal limits  CBC  PREGNANCY, URINE  TROPONIN I (HIGH SENSITIVITY)  TROPONIN I (HIGH SENSITIVITY)     EKG Sinus tachycardia, ventricular rate 112.  PR 166, QRS 68, QTc 431.  No acute ST elevations or depressions.  No ischemia or infarct.   RADIOLOGY CT angio: No acute abnormality, resolution of previous infiltrate Chest x-ray clear   I also independently reviewed and agree with radiologist interpretations.   PROCEDURES:  Critical Care performed: No   MEDICATIONS ORDERED IN ED: Medications  iohexol (OMNIPAQUE) 350 MG/ML injection 100 mL (100 mLs Intravenous Contrast Given 11/24/22 2054)     IMPRESSION / MDM / ASSESSMENT AND PLAN / ED COURSE  I reviewed the triage vital signs and the nursing notes.                              Differential diagnosis  includes, but is not limited to, musculoskeletal chest pain, PE, pneumonia, recurrent B-cell lymphoma, atelectasis, pneumothorax, ACS  Patient's presentation is most consistent with acute presentation with potential threat to life or bodily function.  57 yo F here with chest pain. Pt has h/o B Cell lymphoma, was concerned it could be related to this. CT imaging obtained, reviewed, shows no PE, PNA, or new mass/lesion. CXR clear. EKG nonischemic, trop negative and I do not suspect ACS clinically. CBC and BMP unremarkable. No leukocytosis. No abdominal pain. Will d/c with likely MSK chest wall pain, return precautions.     FINAL CLINICAL IMPRESSION(S) / ED DIAGNOSES   Final diagnoses:  Chest wall pain     Rx / DC Orders   ED Discharge Orders          Ordered    naproxen (NAPROSYN) 375 MG tablet  2 times daily with meals,   Status:  Discontinued        11/24/22 2231    naproxen (NAPROSYN) 375 MG tablet  2 times daily with meals        11/24/22 2238             Note:  This document was  prepared using Dragon voice recognition software and may include unintentional dictation errors.   Duffy Bruce, MD 11/24/22 [phone]

## 2022-11-30 ENCOUNTER — Ambulatory Visit: Payer: PRIVATE HEALTH INSURANCE

## 2022-12-03 ENCOUNTER — Ambulatory Visit: Payer: PRIVATE HEALTH INSURANCE | Admitting: Oncology

## 2022-12-07 DIAGNOSIS — S92511D Displaced fracture of proximal phalanx of right lesser toe(s), subsequent encounter for fracture with routine healing: Secondary | ICD-10-CM | POA: Diagnosis not present

## 2022-12-11 ENCOUNTER — Ambulatory Visit: Payer: PRIVATE HEALTH INSURANCE

## 2022-12-15 ENCOUNTER — Inpatient Hospital Stay: Payer: PRIVATE HEALTH INSURANCE | Admitting: Oncology

## 2022-12-20 ENCOUNTER — Ambulatory Visit (HOSPITAL_COMMUNITY)
Admission: RE | Admit: 2022-12-20 | Discharge: 2022-12-20 | Disposition: A | Discharge status: Home / Self Care | Payer: PRIVATE HEALTH INSURANCE | Source: Ambulatory Visit | Attending: Family Medicine | Admitting: Family Medicine

## 2022-12-20 ENCOUNTER — Encounter (HOSPITAL_COMMUNITY): Payer: Self-pay

## 2022-12-20 ENCOUNTER — Other Ambulatory Visit: Payer: Self-pay

## 2022-12-20 VITALS — BP 112/72 | HR 110 | Temp 97.8°F | Resp 20

## 2022-12-20 DIAGNOSIS — L239 Allergic contact dermatitis, unspecified cause: Secondary | ICD-10-CM

## 2022-12-20 MED ORDER — TRIAMCINOLONE ACETONIDE 40 MG/ML IJ SUSP
INTRAMUSCULAR | Status: AC
  Filled 2022-12-20: qty 1

## 2022-12-20 MED ORDER — PREDNISONE 20 MG PO TABS: 40.0000 mg | ORAL_TABLET | Freq: Every day | ORAL | 0 refills | Status: AC

## 2022-12-20 MED ORDER — TRIAMCINOLONE ACETONIDE 40 MG/ML IJ SUSP
40.0000 mg | Freq: Once | INTRAMUSCULAR | Status: AC
  Administered 2022-12-20: 40 mg via INTRAMUSCULAR

## 2022-12-20 NOTE — ED Triage Notes (Signed)
Insect bites to left leg and has one bump on right forearm.  Noticed 2 days ago.  Areas itch.  Patient noticed this after taking a nap.  No recent vacations, no yard work  Diphenhydramine anti-itch cream ha been used

## 2022-12-20 NOTE — ED Provider Notes (Signed)
[facility]    CSN: [id number] Arrival date & time: 12/20/22  1551      History   Chief Complaint Chief Complaint  Patient presents with   Appointment    16:30   Rash    HPI Jennifer Cole is a 57 y.o. female.    Rash  Here for a pruritic rash on her left lower leg.  It began 2 days ago.  She woke from a nap and noticed the rash and the itching.  She is starting to have a little bit of rash on her right forearm also.  No trouble breathing and no fever.  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   Hammer toe of right foot 06/06/2018   Foot pain, right 06/06/2018   Chronic pain of both knees 03/21/2018   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   Hallux valgus (acquired), right foot 01/24/2018   Hallux rigidus of right foot 01/24/2018   Metatarsalgia of right foot 01/24/2018   Moderate episode of recurrent major depressive disorder 11/04/2017   History of cervical cancer 11/01/2017    Past Surgical History:  Procedure Laterality Date   ABDOMINAL HYSTERECTOMY     CESAREAN SECTION     CHOLECYSTECTOMY     IR PERC PLEURAL DRAIN W/INDWELL CATH W/IMG GUIDE  11/11/2020   IR REMOVAL OF PLURAL CATH W/CUFF  12/13/2020   IR REMOVAL TUN ACCESS W/ PORT W/O FL MOD SED  08/27/2021   PORTA CATH INSERTION N/A 11/18/2020   Procedure: PORTA CATH INSERTION;  Surgeon: Annice Needy, MD;  Location: [facility] INVASIVE CV LAB;   Service: Cardiovascular;  Laterality: N/A;    OB History     Gravida  4   Para  3   Term  3   Preterm      AB  1   Living  3      SAB      IAB      Ectopic      Multiple      Live Births  3            Home Medications    Prior to Admission medications   Medication Sig Start Date End Date Taking? Authorizing Provider  predniSONE (DELTASONE) 20 MG tablet Take 2 tablets (40 mg total) by mouth daily with breakfast for 5 days. 12/20/22 12/25/22 Yes Zenia Resides, MD    Family History Family History  Problem Relation Age of Onset   Breast cancer Neg Hx    Ovarian cancer Neg Hx    Colon cancer Neg Hx    Diabetes Neg Hx     Social History Social History   Tobacco Use   Smoking status: Never   Smokeless tobacco: Never  Vaping Use   Vaping Use: Never used  Substance Use Topics   Alcohol use: Never   Drug use: Never     Allergies  Patient has no known allergies.   Review of Systems Review of Systems  Skin:  Positive for rash.     Physical Exam Triage Vital Signs ED Triage Vitals  Enc Vitals Group     BP 12/20/22 1616 112/72     Pulse Rate 12/20/22 1616 (!) 110     Resp 12/20/22 1616 20     Temp 12/20/22 1616 97.8 F (36.6 C)     Temp Source 12/20/22 1616 Oral     SpO2 12/20/22 1616 96 %     Weight --      Height --      Head Circumference --      Peak Flow --      Pain Score 12/20/22 1614 0     Pain Loc --      Pain Edu? --      Excl. in [place]? --    No data found.  Updated Vital Signs BP 112/72 (BP Location: Left Arm) Comment (BP Location): large cuff  Pulse (!) 110   Temp 97.8 F (36.6 C) (Oral)   Resp 20   SpO2 96%   Visual Acuity Right Eye Distance:   Left Eye Distance:   Bilateral Distance:    Right Eye Near:   Left Eye Near:    Bilateral Near:     Physical Exam Vitals reviewed.  Constitutional:      General: She is not in acute distress.    Appearance: She is not ill-appearing, toxic-appearing or  diaphoretic.  Cardiovascular:     Rate and Rhythm: Normal rate and regular rhythm.  Pulmonary:     Effort: Pulmonary effort is normal.     Breath sounds: Normal breath sounds. No stridor. No wheezing.  Skin:    Coloration: Skin is not pale.     Comments: There is an erythematous fairly flat rash on her anterior left Lower leg.  There is 1 mildly erythematous spot on her right forearm that is about half centimeter in diameter.  Neurological:     General: No focal deficit present.     Mental Status: She is alert and oriented to person, place, and time.  Psychiatric:        Behavior: Behavior normal.      UC Treatments / Results  Labs (all labs ordered are listed, but only abnormal results are displayed) Labs Reviewed - No data to display  EKG   Radiology No results found.  Procedures Procedures (including critical care time)  Medications Ordered in UC Medications  triamcinolone acetonide (KENALOG-40) injection 40 mg (has no administration in time range)    Initial Impression / Assessment and Plan / UC Course  I have reviewed the triage vital signs and the nursing notes.  Pertinent labs & imaging results that were available during my care of the patient were reviewed by me and considered in my medical decision making (see chart for details).        Triamcinolone 40 mg was given here and prednisone burst is sent to the pharmacy.  She will take Zyrtec as needed. Final Clinical Impressions(s) / UC Diagnoses   Final diagnoses:  Allergic contact dermatitis, unspecified trigger     Discharge Instructions      You have been given an injection of triamcinolone 40 mg  Take prednisone 20 mg--2 daily for 5 days  Take Zyrtec/cetirizine 10 mg over the counter--1 tablet daily as needed for allergy or itching.     ED Prescriptions  Medication Sig Dispense Auth. Provider   predniSONE (DELTASONE) 20 MG tablet Take 2 tablets (40 mg total) by mouth daily with  breakfast for 5 days. 10 tablet Marlinda Mike Janace Aris, MD      PDMP not reviewed this encounter.   Zenia Resides, MD 12/20/22 [phone]

## 2022-12-20 NOTE — Discharge Instructions (Addendum)
You have been given an injection of triamcinolone 40 mg  Take prednisone 20 mg--2 daily for 5 days  Take Zyrtec/cetirizine 10 mg over the counter--1 tablet daily as needed for allergy or itching.

## 2022-12-23 ENCOUNTER — Ambulatory Visit: Payer: PRIVATE HEALTH INSURANCE

## 2022-12-24 ENCOUNTER — Telehealth: Payer: Self-pay | Admitting: Oncology

## 2022-12-24 NOTE — Telephone Encounter (Signed)
I have attempted to contact both patient and patient contacts that are listed. Pt has no showed to CT and follow up with Dr. Orlie Dakin. I have called several times before appointments and messages via MyChart to notify patient of appts. I have left pt a voicemail to call us back to reschedule those appointments.

## 2022-12-25 ENCOUNTER — Inpatient Hospital Stay: Payer: PRIVATE HEALTH INSURANCE | Admitting: Oncology

## 2023-02-12 ENCOUNTER — Telehealth: Payer: Self-pay | Admitting: Oncology

## 2023-02-12 NOTE — Telephone Encounter (Signed)
Attempted to contact pt to reschedule CT with no answer. Ct has been rescheduled x3. Pt no showed to last visit. I have left her a VM with our information to call us back to reschedule CT and Dr. Orlie Dakin follow up appt.

## 2023-02-13 ENCOUNTER — Ambulatory Visit (HOSPITAL_COMMUNITY)
Admission: EM | Admit: 2023-02-13 | Discharge: 2023-02-13 | Disposition: A | Discharge status: Home / Self Care | Payer: PRIVATE HEALTH INSURANCE | Attending: Orthopedic Surgery | Admitting: Orthopedic Surgery

## 2023-02-13 ENCOUNTER — Encounter (HOSPITAL_COMMUNITY): Payer: Self-pay

## 2023-02-13 DIAGNOSIS — R21 Rash and other nonspecific skin eruption: Secondary | ICD-10-CM | POA: Diagnosis not present

## 2023-02-13 MED ORDER — TRIAMCINOLONE ACETONIDE 0.5 % EX OINT: 1.0000 | TOPICAL_OINTMENT | Freq: Two times a day (BID) | CUTANEOUS | 0 refills | Status: DC

## 2023-02-13 MED ORDER — PREDNISONE 10 MG PO TABS: ORAL_TABLET | ORAL | 0 refills | Status: DC

## 2023-02-13 NOTE — ED Triage Notes (Signed)
Patient here today with itches spots on both legs for about 10 days that appears to be spreading. She came here in April with the same symptoms. The rash went away with the injection and medication that she was given but has come back. She has been using Benadryl cream but not helping.

## 2023-02-13 NOTE — ED Provider Notes (Signed)
[facility]    CSN: [id number] Arrival date & time: 02/13/23  1010      History   Chief Complaint Chief Complaint  Patient presents with   Rash    HPI Jennifer Cole is a 57 y.o. female.  Presents to the emergency department evaluation of rash on her right arm and leg this been present for about 10 days.  She denies any severe pain but only itching.  She was seen 12/20/2022 given prednisone for 5 days which helped.  She has also been using some Benadryl cream since the return of the rash and has not seen any relief.  She states she is living at a motel but has not noticed any bites or any bugs on the beds.  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   Mass of right lung 10/25/2020   Callus of foot 06/29/2019   Metatarsalgia, left foot 06/29/2019   Mechanical complic of internal orthopedic device, implant or graft (HCC) 01/04/2019   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   Hammer toe of right foot 06/06/2018   Foot pain, right 06/06/2018   Chronic pain of both knees 03/21/2018   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   Hallux valgus (acquired), right foot 01/24/2018   Hallux rigidus of right foot 01/24/2018   Metatarsalgia of right foot 01/24/2018   Moderate episode of recurrent major depressive disorder (HCC) 11/04/2017   History of cervical cancer 11/01/2017    Past Surgical History:  Procedure Laterality Date   ABDOMINAL HYSTERECTOMY     CESAREAN SECTION     CHOLECYSTECTOMY     IR PERC PLEURAL DRAIN W/INDWELL CATH W/IMG GUIDE  11/11/2020    IR REMOVAL OF PLURAL CATH W/CUFF  12/13/2020   IR REMOVAL TUN ACCESS W/ PORT W/O FL MOD SED  08/27/2021   PORTA CATH INSERTION N/A 11/18/2020   Procedure: PORTA CATH INSERTION;  Surgeon: Annice Needy, MD;  Location: [facility] INVASIVE CV LAB;  Service: Cardiovascular;  Laterality: N/A;    OB History     Gravida  4   Para  3   Term  3   Preterm      AB  1   Living  3      SAB      IAB      Ectopic      Multiple      Live Births  3            Home Medications    Prior to Admission medications   Medication Sig Start Date End Date Taking? Authorizing Provider  predniSONE (DELTASONE) 10 MG tablet 10 day taper. 5,5,4,4,3,3,2,2,1,1 02/13/23  Yes Evon Slack, PA-C  triamcinolone ointment (KENALOG) 0.5 % Apply 1 Application topically 2 (two) times daily. 02/13/23  Yes Evon Slack, PA-C    Family History Family History  Problem Relation Age of Onset   Breast cancer Neg Hx    Ovarian cancer Neg Hx    Colon cancer Neg Hx    Diabetes Neg Hx  Social History Social History   Tobacco Use   Smoking status: Never   Smokeless tobacco: Never  Vaping Use   Vaping Use: Never used  Substance Use Topics   Alcohol use: Never   Drug use: Never     Allergies   Patient has no known allergies.   Review of Systems Review of Systems   Physical Exam Triage Vital Signs ED Triage Vitals  Enc Vitals Group     BP 02/13/23 1115 112/72     Pulse Rate 02/13/23 1115 (!) 101     Resp 02/13/23 1115 16     Temp 02/13/23 1115 98.9 F (37.2 C)     Temp Source 02/13/23 1115 Oral     SpO2 02/13/23 1115 97 %     Weight 02/13/23 1114 240 lb (108.9 kg)     Height 02/13/23 1114 5\' 6"  (1.676 m)     Head Circumference --      Peak Flow --      Pain Score 02/13/23 1114 0     Pain Loc --      Pain Edu? --      Excl. in [place]? --    No data found.  Updated Vital Signs BP 112/72 (BP Location: Left Arm)   Pulse (!) 101   Temp 98.9 F (37.2 C) (Oral)   Resp 16   Ht 5\' 6"   (1.676 m)   Wt 240 lb (108.9 kg)   SpO2 97%   BMI 38.74 kg/m   Visual Acuity Right Eye Distance:   Left Eye Distance:   Bilateral Distance:    Right Eye Near:   Left Eye Near:    Bilateral Near:     Physical Exam Constitutional:      Appearance: She is well-developed.  HENT:     Head: Normocephalic and atraumatic.  Eyes:     Conjunctiva/sclera: Conjunctivae normal.  Cardiovascular:     Rate and Rhythm: Normal rate.  Pulmonary:     Effort: Pulmonary effort is normal. No respiratory distress.  Abdominal:     General: Bowel sounds are normal. There is no distension.     Tenderness: There is no abdominal tenderness. There is no right CVA tenderness, left CVA tenderness or guarding.  Musculoskeletal:        General: Normal range of motion.     Cervical back: Normal range of motion.  Skin:    General: Skin is warm.     Findings: No rash.     Comments: Flat macular erythematous rash in groupings on the right leg.  Some on the right arm.  No other rash throughout the remainder of the body.  No tenderness palpation.  No burning or sensation loss.  No vesicular lesions.  Neurological:     Mental Status: She is alert and oriented to person, place, and time.  Psychiatric:        Behavior: Behavior normal.        Thought Content: Thought content normal.      UC Treatments / Results  Labs (all labs ordered are listed, but only abnormal results are displayed) Labs Reviewed - No data to display  EKG   Radiology No results found.  Procedures Procedures (including critical care time)  Medications Ordered in UC Medications - No data to display  Initial Impression / Assessment and Plan / UC Course  I have reviewed the triage vital signs and the nursing notes.  Pertinent labs & imaging results that were available during  my care of the patient were reviewed by me and considered in my medical decision making (see chart for details).     57 year old female with pruritic  rash right leg and right arm.  Rash has been presents for a couple of months but recently returned for about 10 days.  Responded well initially to prednisone a couple of months ago.  Rash returned.  Will place on second steroid taper to help with rash and itching.  Will also prescribe triamcinolone cream.  She is encouraged to check her hotel room for bedbugs as this could be a form of dermatitis.  She understands signs and symptoms return to urgent care for.  Patient's vital signs are stable.  Patient has no other systemic symptoms or concerning symptoms.  She appears well. Final Clinical Impressions(s) / UC Diagnoses   Final diagnoses:  Rash and nonspecific skin eruption     Discharge Instructions      Please use medications as prescribed.  Follow-up with dermatologist if symptoms return return to the ER for any worsening symptoms or any urgent changes in your health   ED Prescriptions     Medication Sig Dispense Auth. Provider   predniSONE (DELTASONE) 10 MG tablet 10 day taper. 5,5,4,4,3,3,2,2,1,1 30 tablet Amador Cunas C, PA-C   triamcinolone ointment (KENALOG) 0.5 % Apply 1 Application topically 2 (two) times daily. 30 g Evon Slack, PA-C      PDMP not reviewed this encounter.   Evon Slack, PA-C 02/13/23 1158

## 2023-02-13 NOTE — Discharge Instructions (Signed)
Please use medications as prescribed.  Follow-up with dermatologist if symptoms return return to the ER for any worsening symptoms or any urgent changes in your health

## 2023-02-16 ENCOUNTER — Telehealth: Payer: Self-pay | Admitting: Oncology

## 2023-02-16 NOTE — Telephone Encounter (Signed)
Pt call and stated that she called and got her CT rescheduled. We scheduled a follow up appt with Dr. Orlie Dakin for the results. Pt stated that she did not have transportation to her appt and I let her know that I would reach out to [place] to ask about transportation. She stated that we do not go to [place] to pick up patients. I attempted to contact the patient back but the numbers on file would not allow me to leave a VM.

## 2023-02-26 ENCOUNTER — Ambulatory Visit
Admission: RE | Admit: 2023-02-26 | Discharge: 2023-02-26 | Disposition: A | Discharge status: Home / Self Care | Payer: PRIVATE HEALTH INSURANCE | Source: Ambulatory Visit | Attending: Oncology | Admitting: Oncology

## 2023-02-26 DIAGNOSIS — I3139 Other pericardial effusion (noninflammatory): Secondary | ICD-10-CM | POA: Diagnosis not present

## 2023-02-26 DIAGNOSIS — C8520 Mediastinal (thymic) large B-cell lymphoma, unspecified site: Secondary | ICD-10-CM

## 2023-02-26 DIAGNOSIS — C8590 Non-Hodgkin lymphoma, unspecified, unspecified site: Secondary | ICD-10-CM | POA: Diagnosis not present

## 2023-02-26 MED ORDER — IOHEXOL 300 MG/ML  SOLN
75.0000 mL | Freq: Once | INTRAMUSCULAR | Status: AC | PRN
  Administered 2023-02-26: 75 mL via INTRAVENOUS

## 2023-03-01 ENCOUNTER — Inpatient Hospital Stay: Payer: PRIVATE HEALTH INSURANCE | Admitting: Nurse Practitioner

## 2023-03-04 ENCOUNTER — Inpatient Hospital Stay: Payer: PRIVATE HEALTH INSURANCE | Admitting: Oncology

## 2023-03-17 ENCOUNTER — Inpatient Hospital Stay: Payer: PRIVATE HEALTH INSURANCE | Attending: Oncology | Admitting: Oncology

## 2023-03-17 ENCOUNTER — Encounter: Payer: Self-pay | Admitting: Oncology

## 2023-03-17 VITALS — BP 114/80 | HR 96 | Temp 97.5°F | Resp 16 | Ht 66.0 in | Wt 237.0 lb

## 2023-03-17 DIAGNOSIS — R519 Headache, unspecified: Secondary | ICD-10-CM | POA: Diagnosis not present

## 2023-03-17 DIAGNOSIS — C8520 Mediastinal (thymic) large B-cell lymphoma, unspecified site: Secondary | ICD-10-CM | POA: Diagnosis not present

## 2023-03-17 DIAGNOSIS — R21 Rash and other nonspecific skin eruption: Secondary | ICD-10-CM | POA: Diagnosis not present

## 2023-03-17 NOTE — Progress Notes (Signed)
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  She has noticed an increase of migraine headaches recently and a rash on her extremities that comes and goes.  She otherwise feels well.  She has no other neurologic complaints.  She denies any recent fevers or illnesses.  She has a fair appetite, but denies weight loss.  She denies any chest pain, shortness of breath, cough, or hemoptysis.  She denies any nausea, vomiting, constipation, or diarrhea.  She has no urinary complaints.  Patient offers no further specific complaints today.  REVIEW OF SYSTEMS:   Review of Systems  Constitutional: Negative.  Negative for fever, malaise/fatigue and weight loss.  Respiratory: Negative.  Negative for cough, hemoptysis and shortness of breath.   Cardiovascular: Negative.  Negative for chest pain and leg swelling.  Gastrointestinal: Negative.  Negative for abdominal pain and nausea.  Genitourinary: Negative.  Negative for dysuria and flank pain.  Musculoskeletal:  Negative for back pain.  Skin:  Positive for rash.  Neurological:  Positive for headaches. Negative for dizziness, focal weakness and weakness.  Psychiatric/Behavioral: Negative.  The patient is not nervous/anxious.     As per HPI. Otherwise, a complete review of systems is negative.  PAST MEDICAL HISTORY: Past Medical History:  Diagnosis Date   Cancer (HCC)    cervical   Depression    Non Hodgkin's lymphoma (HCC)      PAST SURGICAL HISTORY: Past Surgical History:  Procedure Laterality Date   ABDOMINAL HYSTERECTOMY     CESAREAN SECTION     CHOLECYSTECTOMY     IR PERC PLEURAL DRAIN W/INDWELL CATH W/IMG GUIDE  11/11/2020   IR REMOVAL OF PLURAL CATH W/CUFF  12/13/2020   IR REMOVAL TUN ACCESS W/ PORT W/O FL MOD SED  08/27/2021   PORTA CATH INSERTION N/A 11/18/2020   Procedure: PORTA CATH INSERTION;  Surgeon: Annice Needy, MD;  Location: [facility] INVASIVE CV LAB;  Service: Cardiovascular;  Laterality: N/A;    FAMILY HISTORY: Family History  Problem Relation Age of Onset   Breast cancer Neg Hx    Ovarian cancer Neg Hx    Colon cancer Neg Hx    Diabetes Neg Hx     ADVANCED DIRECTIVES (Y/N):  N  HEALTH MAINTENANCE: Social History   Tobacco Use   Smoking status: Never   Smokeless tobacco: Never  Vaping Use   Vaping Use: Never used  Substance Use Topics   Alcohol use: Never   Drug use: Never     Colonoscopy:  PAP:  Bone density:  Lipid panel:  No Known Allergies  Current Outpatient Medications  Medication Sig Dispense Refill   triamcinolone ointment (KENALOG) 0.5 % Apply 1 Application topically 2 (two) times daily. 30 g 0   No current facility-administered medications for this visit.    OBJECTIVE: Vitals:   03/17/23 0956  BP: 114/80  Pulse: 96  Resp: 16  Temp: (!) 97.5 F (36.4 C)  SpO2: 100%      Body mass index is 38.25 kg/m.    ECOG FS:0 - Asymptomatic  General: Well-developed, well-nourished, no acute distress. Eyes: Pink conjunctiva, anicteric sclera. HEENT: Normocephalic, moist mucous membranes. Lungs: No audible wheezing or coughing. Heart: Regular rate and rhythm. Abdomen: Soft, nontender, no obvious distention. Musculoskeletal: No edema, cyanosis, or clubbing. Neuro: Alert, answering all questions appropriately. Cranial nerves grossly intact. Skin: No rashes or petechiae noted. Psych: Normal affect.  LAB RESULTS:  Lab Results  Component Value Date   NA 139  11/24/2022   K 3.9 11/24/2022   CL 105 11/24/2022   CO2 26 11/24/2022   GLUCOSE 130 (H) 11/24/2022   BUN 17 11/24/2022   CREATININE 0.65 11/24/2022   CALCIUM 9.1 11/24/2022   PROT 7.3 08/19/2021   ALBUMIN 3.8 08/19/2021   AST 29 08/19/2021   ALT 29 08/19/2021   ALKPHOS 107 08/19/2021   BILITOT 1.4 (H) 08/19/2021   GFRNONAA >60 11/24/2022    Lab Results  Component Value Date   WBC 6.3 11/24/2022   NEUTROABS 5.7 08/19/2021   HGB 13.5 11/24/2022   HCT 39.9 11/24/2022   MCV 91.9 11/24/2022   PLT 241 11/24/2022     STUDIES: CT Chest W Contrast  Result Date: 03/04/2023 CLINICAL DATA:  Lymphoma staging; * Tracking Code: BO * EXAM: CT CHEST WITH CONTRAST TECHNIQUE: Multidetector CT imaging of the chest was performed during intravenous contrast administration. RADIATION DOSE REDUCTION: This exam was performed according to the departmental dose-optimization program which includes automated exposure control, adjustment of the mA and/or kV according to patient size and/or use of iterative reconstruction technique. CONTRAST:  75mL OMNIPAQUE IOHEXOL 300 MG/ML  SOLN COMPARISON:  Chest CT dated November 24, 2022 FINDINGS: Cardiovascular: Normal heart size. Trace pericardial effusion. Normal caliber thoracic aorta with no significant atherosclerotic disease. Mediastinum/Nodes: Esophagus and thyroid are unremarkable. No enlarged lymph nodes seen in the chest. Lungs/Pleura: Central airways are patent. Stable bilateral mediastinal/perihilar postradiation change. Stable left apical lung opacities, likely additional site of postradiation change. No consolidation, pleural effusion or pneumothorax. Upper Abdomen: Prior cholecystectomy.  No acute abnormality. Musculoskeletal: No chest wall abnormality. No acute or significant osseous findings. IMPRESSION: 1. No enlarged lymph nodes seen in the chest. 2. Stable bilateral mediastinal/perihilar postradiation change. Electronically Signed   By: Allegra Lai M.D.    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 PET scans results from August 13, 2021 reviewed independently with resolution of disease.  Her most recent CT scan on March 04, 2023 reviewed independently and report as above with no obvious evidence of recurrent or progressive disease.  Patient is nearly 2 years removed from completing her treatment and can be transitioned to yearly imaging evaluation.   Headache: Patient has a history of migraines.  She has been instructed to obtain a primary care physician for further follow-up.   Rash: Patient states she was evaluated urgent care and recommended dermatology.   Right arm DVT: Resolved.  Eliquis has been discontinued and port removed.   Patient expressed understanding and was in agreement with this plan. She also understands that She can call clinic at any time with any questions, concerns, or complaints.    Cancer Staging  Mediastinal (thymic) large B-cell lymphoma (HCC) Staging form: Hodgkin and Non-Hodgkin Lymphoma, AJCC 8th Edition - Clinical stage from 11/13/2020: Stage II  bulky (Diffuse large B-cell lymphoma) - Signed by Jeralyn Ruths, MD on 11/13/2020   Jeralyn Ruths, MD   03/17/2023 12:56 PM

## 2023-03-17 NOTE — Progress Notes (Signed)
Having a lot of headaches within the last 2 weeks. States it would hurt to touch the left side of head. Does have history of migraines.

## 2023-04-21 ENCOUNTER — Ambulatory Visit (HOSPITAL_COMMUNITY): Payer: PRIVATE HEALTH INSURANCE

## 2023-04-22 ENCOUNTER — Encounter (HOSPITAL_COMMUNITY): Payer: Self-pay

## 2023-04-22 ENCOUNTER — Ambulatory Visit (HOSPITAL_COMMUNITY)
Admission: RE | Admit: 2023-04-22 | Discharge: 2023-04-22 | Disposition: A | Discharge status: Home / Self Care | Payer: PRIVATE HEALTH INSURANCE | Source: Ambulatory Visit | Attending: Family Medicine | Admitting: Family Medicine

## 2023-04-22 VITALS — BP 111/77 | HR 108 | Temp 97.9°F | Resp 16 | Ht 66.0 in | Wt 240.0 lb

## 2023-04-22 DIAGNOSIS — R519 Headache, unspecified: Secondary | ICD-10-CM | POA: Diagnosis not present

## 2023-04-22 MED ORDER — KETOROLAC TROMETHAMINE 30 MG/ML IJ SOLN
INTRAMUSCULAR | Status: AC
  Filled 2023-04-22: qty 1

## 2023-04-22 MED ORDER — KETOROLAC TROMETHAMINE 30 MG/ML IJ SOLN
30.0000 mg | Freq: Once | INTRAMUSCULAR | Status: AC
  Administered 2023-04-22: 30 mg via INTRAMUSCULAR

## 2023-04-22 MED ORDER — SUMATRIPTAN SUCCINATE 100 MG PO TABS: 100.0000 mg | ORAL_TABLET | ORAL | 0 refills | Status: DC | PRN

## 2023-04-22 NOTE — ED Provider Notes (Signed)
[facility]    CSN: [id number] Arrival date & time: 04/22/23  1748      History   Chief Complaint Chief Complaint  Patient presents with   Headache    HPI Jennifer Cole is a 57 y.o. female.    Headache Here for left-sided posterior headache has been radiating up to her left frontal area.  Is been coming and going for the last week.  She does feel nauseated with it but does not have any photophobia.  She does have a history of migraine  No allergies to medications  She is still having some rash off and on.  I saw her for that back in April   She does not have a primary care provider.  She is seeing oncology for B-cell lymphoma history.   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   Hammer toe of right foot 06/06/2018   Foot pain, right 06/06/2018   Chronic pain of both knees 03/21/2018   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   Hallux valgus (acquired), right foot 01/24/2018   Hallux rigidus of right foot 01/24/2018   Metatarsalgia of right foot 01/24/2018   Moderate episode of recurrent major depressive disorder (HCC) 11/04/2017   History of cervical cancer 11/01/2017    Past Surgical History:  Procedure Laterality Date   ABDOMINAL HYSTERECTOMY     CESAREAN SECTION     CHOLECYSTECTOMY     IR PERC PLEURAL DRAIN W/INDWELL CATH W/IMG GUIDE  11/11/2020   IR REMOVAL OF PLURAL CATH W/CUFF   12/13/2020   IR REMOVAL TUN ACCESS W/ PORT W/O FL MOD SED  08/27/2021   PORTA CATH INSERTION N/A 11/18/2020   Procedure: PORTA CATH INSERTION;  Surgeon: Annice Needy, MD;  Location: [facility] INVASIVE CV LAB;  Service: Cardiovascular;  Laterality: N/A;    OB History     Gravida  4   Para  3   Term  3   Preterm      AB  1   Living  3      SAB      IAB      Ectopic      Multiple      Live Births  3            Home Medications    Prior to Admission medications   Medication Sig Start Date End Date Taking? Authorizing Provider  SUMAtriptan (IMITREX) 100 MG tablet Take 1 tablet (100 mg total) by mouth every 2 (two) hours as needed for migraine. May repeat in 2 hours if headache persists or recurs. 04/22/23  Yes Zenia Resides, MD    Family History Family History  Problem Relation Age of Onset   Breast cancer Neg Hx    Ovarian cancer Neg Hx    Colon cancer Neg Hx    Diabetes Neg Hx     Social History Social History   Tobacco  Use   Smoking status: Never   Smokeless tobacco: Never  Vaping Use   Vaping status: Never Used  Substance Use Topics   Alcohol use: Never   Drug use: Never     Allergies   Patient has no known allergies.   Review of Systems Review of Systems  Neurological:  Positive for headaches.     Physical Exam Triage Vital Signs ED Triage Vitals  Encounter Vitals Group     BP 04/22/23 1812 111/77     Systolic BP Percentile --      Diastolic BP Percentile --      Pulse Rate 04/22/23 1812 (!) 108     Resp 04/22/23 1812 16     Temp 04/22/23 1812 97.9 F (36.6 C)     Temp Source 04/22/23 1812 Oral     SpO2 04/22/23 1812 95 %     Weight 04/22/23 1812 240 lb (108.9 kg)     Height 04/22/23 1812 5\' 6"  (1.676 m)     Head Circumference --      Peak Flow --      Pain Score 04/22/23 1810 8     Pain Loc --      Pain Education --      Exclude from Growth Chart --    No data found.  Updated Vital Signs BP 111/77 (BP Location: Left  Arm)   Pulse (!) 108   Temp 97.9 F (36.6 C) (Oral)   Resp 16   Ht 5\' 6"  (1.676 m)   Wt 108.9 kg   SpO2 95%   BMI 38.74 kg/m   Visual Acuity Right Eye Distance:   Left Eye Distance:   Bilateral Distance:    Right Eye Near:   Left Eye Near:    Bilateral Near:     Physical Exam Vitals reviewed.  Constitutional:      General: She is not in acute distress.    Appearance: She is not ill-appearing, toxic-appearing or diaphoretic.  HENT:     Nose: Nose normal.     Mouth/Throat:     Mouth: Mucous membranes are moist.  Eyes:     Extraocular Movements: Extraocular movements intact.     Conjunctiva/sclera: Conjunctivae normal.     Pupils: Pupils are equal, round, and reactive to light.  Cardiovascular:     Rate and Rhythm: Normal rate and regular rhythm.     Heart sounds: No murmur heard. Pulmonary:     Effort: Pulmonary effort is normal. No respiratory distress.     Breath sounds: No stridor. No wheezing, rhonchi or rales.  Musculoskeletal:     Cervical back: Neck supple.  Lymphadenopathy:     Cervical: No cervical adenopathy.  Skin:    Capillary Refill: Capillary refill takes less than 2 seconds.     Coloration: Skin is not jaundiced or pale.  Neurological:     General: No focal deficit present.     Mental Status: She is alert and oriented to person, place, and time.     Cranial Nerves: No cranial nerve deficit.     Sensory: No sensory deficit.     Motor: No weakness.     Gait: Gait normal.     Deep Tendon Reflexes: Reflexes normal.  Psychiatric:        Behavior: Behavior normal.      UC Treatments / Results  Labs (all labs ordered are listed, but only abnormal results are displayed) Labs Reviewed - No data to display  EKG  Radiology No results found.  Procedures Procedures (including critical care time)  Medications Ordered in UC Medications  ketorolac (TORADOL) 30 MG/ML injection 30 mg (has no administration in time range)    Initial Impression  / Assessment and Plan / UC Course  I have reviewed the triage vital signs and the nursing notes.  Pertinent labs & imaging results that were available during my care of the patient were reviewed by me and considered in my medical decision making (see chart for details).        She is well-appearing in the exam room in no acute distress.  Toradol injection was given here and Imitrex was sent to the pharmacy.  Staff will help her find a primary care provider.  Final Clinical Impressions(s) / UC Diagnoses   Final diagnoses:  Headache disorder     Discharge Instructions      You have been given a shot of Toradol 30 mg today.  Sumatriptan 100 mg--take 1 tablet as needed for migraine headache.  If in 2 hours, the headache persist, you may repeat the dose once more.  Do not take more than 2 tablets in 24 hours.   You can use the QR code/website at the back of the summary paperwork to schedule yourself a new patient appointment with primary care      ED Prescriptions     Medication Sig Dispense Auth. Provider   SUMAtriptan (IMITREX) 100 MG tablet Take 1 tablet (100 mg total) by mouth every 2 (two) hours as needed for migraine. May repeat in 2 hours if headache persists or recurs. 10 tablet Marlinda Mike Janace Aris, MD      PDMP not reviewed this encounter.   Zenia Resides, MD 04/22/23 [phone]

## 2023-04-22 NOTE — ED Triage Notes (Signed)
Headache x 7 days. Patient has a history of migraines. Started at the left back side of the head and has moved to the front. Not recently sick, no falls, no known injuries.   Patient has tried aspirin, menthol topical for the left side neck pain and no relief with either.

## 2023-04-22 NOTE — Discharge Instructions (Signed)
You have been given a shot of Toradol 30 mg today.  Sumatriptan 100 mg--take 1 tablet as needed for migraine headache.  If in 2 hours, the headache persist, you may repeat the dose once more.  Do not take more than 2 tablets in 24 hours.   You can use the QR code/website at the back of the summary paperwork to schedule yourself a new patient appointment with primary care

## 2023-04-26 ENCOUNTER — Ambulatory Visit (HOSPITAL_BASED_OUTPATIENT_CLINIC_OR_DEPARTMENT_OTHER): Payer: PRIVATE HEALTH INSURANCE | Admitting: Family Medicine

## 2023-05-03 ENCOUNTER — Encounter (HOSPITAL_BASED_OUTPATIENT_CLINIC_OR_DEPARTMENT_OTHER): Payer: Self-pay

## 2023-05-03 ENCOUNTER — Other Ambulatory Visit: Payer: Self-pay

## 2023-05-03 ENCOUNTER — Emergency Department (HOSPITAL_BASED_OUTPATIENT_CLINIC_OR_DEPARTMENT_OTHER): Payer: PRIVATE HEALTH INSURANCE | Admitting: Radiology

## 2023-05-03 ENCOUNTER — Emergency Department (HOSPITAL_BASED_OUTPATIENT_CLINIC_OR_DEPARTMENT_OTHER)
Admission: EM | Admit: 2023-05-03 | Discharge: 2023-05-03 | Disposition: A | Discharge status: Home / Self Care | Payer: Worker's Compensation | Source: Home / Self Care | Attending: Emergency Medicine | Admitting: Emergency Medicine

## 2023-05-03 DIAGNOSIS — W19XXXA Unspecified fall, initial encounter: Secondary | ICD-10-CM

## 2023-05-03 DIAGNOSIS — W01198A Fall on same level from slipping, tripping and stumbling with subsequent striking against other object, initial encounter: Secondary | ICD-10-CM | POA: Diagnosis not present

## 2023-05-03 DIAGNOSIS — Y990 Civilian activity done for income or pay: Secondary | ICD-10-CM | POA: Insufficient documentation

## 2023-05-03 DIAGNOSIS — M79642 Pain in left hand: Secondary | ICD-10-CM | POA: Diagnosis not present

## 2023-05-03 DIAGNOSIS — Y9301 Activity, walking, marching and hiking: Secondary | ICD-10-CM | POA: Insufficient documentation

## 2023-05-03 DIAGNOSIS — S3992XA Unspecified injury of lower back, initial encounter: Secondary | ICD-10-CM | POA: Diagnosis present

## 2023-05-03 DIAGNOSIS — S3210XA Unspecified fracture of sacrum, initial encounter for closed fracture: Secondary | ICD-10-CM | POA: Diagnosis not present

## 2023-05-03 NOTE — ED Notes (Signed)
Pt verbalized understanding of d/c instructions, meds, and followup care. Denies questions. VSS, no distress noted. Steady gait to exit with all belongings.  ?

## 2023-05-03 NOTE — ED Provider Notes (Signed)
[place] EMERGENCY DEPARTMENT AT [facility] Provider Note   CSN: [id number] Arrival date & time: 05/03/23  1313     History  Chief Complaint  Patient presents with   Jennifer Cole    [name] is a 57 y.o. female.  Patient with noncontributory past medical history presents today with complaints of fall.  She states that same occurred earlier today when the patient was at work and fell backwards onto her left hip. She did try to catch herself with her left hand as well. She notes that she did slightly bump her head on a empty cardboard box on the way down but did not loose consciousness.  She is not on anticoagulation.  She denies any headache, vision changes, or neck pain.  She was able to get up off the ground and walk around without issue.  States that initially she was without pain, however in the next few hours she noted some pain in her left hip and left hand.  She continues to be able to walk without issue.  She is able to fully range her hand as well.  Denies any other injuries or complaints.  The history is provided by the patient. No language interpreter was used.  Fall       Home Medications Prior to Admission medications   Medication Sig Start Date End Date Taking? Authorizing Provider  SUMAtriptan (IMITREX) 100 MG tablet Take 1 tablet (100 mg total) by mouth every 2 (two) hours as needed for migraine. May repeat in 2 hours if headache persists or recurs. 04/22/23   Jennifer Resides, MD      Allergies    Patient has no known allergies.    Review of Systems   Review of Systems  Musculoskeletal:  Positive for myalgias.  All other systems reviewed and are negative.   Physical Exam Updated Vital Signs BP 123/80 (BP Location: Right Arm)   Pulse (!) 105   Temp 98 F (36.7 C)   Resp 18   Ht 5\' 6"  (1.676 m)   Wt 108.9 kg   SpO2 99%   BMI 38.74 kg/m  Physical Exam Vitals and nursing note reviewed.  Constitutional:      General: She is not in acute  distress.    Appearance: Normal appearance. She is normal weight. She is not ill-appearing, toxic-appearing or diaphoretic.  HENT:     Head: Normocephalic and atraumatic.     Comments: No racoon eyes No battle sign Cardiovascular:     Rate and Rhythm: Normal rate.  Pulmonary:     Effort: Pulmonary effort is normal. No respiratory distress.  Abdominal:     General: Abdomen is flat.     Palpations: Abdomen is soft.     Tenderness: There is no abdominal tenderness.  Musculoskeletal:        General: Normal range of motion.     Cervical back: Normal, normal range of motion and neck supple. No rigidity.     Thoracic back: Normal.     Lumbar back: Normal.     Comments: No midline tenderness, no stepoffs or deformity noted on palpation of cervical, thoracic, and lumbar spine  TTP over the dorsal aspect of the left hand specifically over the first MCP. Mild bruising noted. ROM intact with no obvious discomfort. Able to range the wrist without issue. No snuffbox tenderness. No obvious deformity.  Good capillary refill and radial and ulnar pulses intact and 2+.  Mild TTP over the iliac crest of the  left hip. No bruising or deformity.  Patient observed to be ambulatory with steady gait.  Distal pulses intact and 2+.  Skin:    General: Skin is warm and dry.  Neurological:     General: No focal deficit present.     Mental Status: She is alert and oriented to person, place, and time.     GCS: GCS eye subscore is 4. GCS verbal subscore is 5. GCS motor subscore is 6.  Psychiatric:        Mood and Affect: Mood normal.        Behavior: Behavior normal.     ED Results / Procedures / Treatments   Labs (all labs ordered are listed, but only abnormal results are displayed) Labs Reviewed - No data to display  EKG None  Radiology DG Hand Complete Left  Result Date: 05/03/2023 CLINICAL DATA:  Fall with left hand pain EXAM: LEFT HAND - COMPLETE 3 VIEW COMPARISON:  None Available. FINDINGS: There  is no evidence of fracture or dislocation. Negative ulnar variance. Soft tissues are unremarkable. IMPRESSION: No acute fracture or dislocation. Electronically Signed   By: Agustin Cree M.D.   On: 05/03/2023 18:37   DG Hip Unilat W or Wo Pelvis 2-3 Views Left  Result Date: 05/03/2023 CLINICAL DATA:  fall EXAM: DG HIP (WITH OR WITHOUT PELVIS) 2-3V LEFT COMPARISON:  PET CT 02/06/2021, CT abdomen pelvis 03/24/2007 FINDINGS: There is no evidence of hip fracture or dislocation of the left hip. No acute displaced fracture or dislocation of the partially visualized right hip on frontal view. Horizontal lucency through the right sacral ala. Otherwise no acute displaced fracture or diastasis of the bones of the pelvis. No there is no evidence of arthropathy or other focal bone abnormality. IMPRESSION: 1. No acute displaced fracture or dislocation of the bones of the left hip. 2. Horizontal lucency through the right sacral ala-query nondisplaced fracture. Consider pelvic radiographs for further evaluation. Electronically Signed   By: Tish Frederickson M.D.   On: 05/03/2023 18:35    Procedures .Ortho Injury Treatment  Date/Time: 05/03/2023 6:54 PM  Performed by: Silva Bandy, PA-C Authorized by: Silva Bandy, PA-C   Consent:    Consent obtained:  Verbal   Risks discussed:  Nerve damage, restricted joint movement and stiffness   Alternatives discussed:  No treatment, alternative treatment, immobilization, delayed treatment and referralInjury location: hand Location details: left hand Injury type: soft tissue Pre-procedure neurovascular assessment: neurovascularly intact Pre-procedure distal perfusion: normal Pre-procedure neurological function: normal Pre-procedure range of motion: normal  Anesthesia: Local anesthesia used: no  Patient sedated: NoImmobilization: brace Splint type: volar short arm Splint Applied by: ED Tech Post-procedure neurovascular assessment: post-procedure neurovascularly  intact Post-procedure distal perfusion: normal Post-procedure neurological function: normal Post-procedure range of motion: normal       Medications Ordered in ED Medications - No data to display  ED Course/ Medical Decision Making/ A&P                                 Medical Decision Making Amount and/or Complexity of Data Reviewed Radiology: ordered.   This patient is a 57 y.o. female  who presents to the ED for concern of fall.   Differential diagnoses prior to evaluation: The emergent differential diagnosis includes, but is not limited to,  trauma . This is not an exhaustive differential.   Past Medical History / Co-morbidities / Social History: Hx non  hodgkins lymphoma  Additional history: Chart reviewed.  Physical Exam: Physical exam performed. The pertinent findings include: TTP over the left illiac crest and the left hand. Good distal pulses and sensation. ROM intact. Observed to be ambulatory with steady gait  Lab Tests/Imaging studies: I personally interpreted labs/imaging and the pertinent results include:  xray imaging obtained of the left hand and left hip which have resulted and reveal  DG left hand: NAD  DG left hip: 1. No acute displaced fracture or dislocation of the bones of the left hip. 2. Horizontal lucency through the right sacral ala-query nondisplaced fracture. Consider pelvic radiographs for further evaluation.  I agree with the radiologist interpretation.   Disposition: After consideration of the diagnostic results and the patients response to treatment, I feel that emergency department workup does not suggest an emergent condition requiring admission or immediate intervention beyond what has been performed at this time. The plan is: discharge with left wrist brace for support and orthopedic referral for follow-up. Patient does have in the right sacrum, however she is ambulatory without issue and is not having any pain in this area, her pain is  exclusively on the left. Recommend close orthopedic referral for follow-up evaluation and management of this injury. She has no neurologic deficits and pain is controlled without analgesia. Evaluation and diagnostic testing in the emergency department does not suggest an emergent condition requiring admission or immediate intervention beyond what has been performed at this time.  Plan for discharge with close PCP follow-up.  Patient is understanding and amenable with plan, educated on red flag symptoms that would prompt immediate return.  Patient discharged in stable condition.  Final Clinical Impression(s) / ED Diagnoses Final diagnoses:  Fall, initial encounter  Closed fracture of sacrum, unspecified portion of sacrum, initial encounter (HCC)    Rx / DC Orders ED Discharge Orders     None     An After Visit Summary was printed and given to the patient.     Vear Clock 05/03/23 Herbie Baltimore    Ernie Avena, MD 05/04/23 [phone]

## 2023-05-03 NOTE — ED Triage Notes (Signed)
Pt arrived via EMS from work. Per EMS, pt caox4, ambulatory on scene reporting she tripped over a wood pallet while walking. Pt reports she hit her head on an empty cardboard box, no obvious injury to the head, denies LOC and does not take blood thinner medication. EMS reports pt c/o L Hip pain with no obvious injury.  EMS VS BP 160/100 HR 110  SpO2 98% RA  CBG 98 RR 18

## 2023-05-03 NOTE — ED Notes (Signed)
Pt c/o left hand and lower back pain.

## 2023-05-03 NOTE — Discharge Instructions (Signed)
As we discussed, your x-rays have not been officially read by a radiologist yet.  However you have opted to leave prior to this being completed.  I have personally reviewed and interpreted your images and do not see any obvious fracture or dislocation.  Given the pain in your left hand, I have given you a brace to wear for support for to use as needed.  I also recommend that you rest, ice, compress, and elevate your hand and take Tylenol/ibuprofen as needed for pain.  I have given you a referral to orthopedics with a number to call to schedule an appointment for follow-up.  Please follow-up as needed at your earliest convenience.  Return if development of any new or worsening symptoms.

## 2023-06-03 ENCOUNTER — Ambulatory Visit: Payer: Self-pay | Admitting: Orthopedic Surgery

## 2023-07-29 ENCOUNTER — Ambulatory Visit (HOSPITAL_COMMUNITY)
Admission: RE | Admit: 2023-07-29 | Discharge: 2023-07-29 | Disposition: A | Discharge status: Home / Self Care | Payer: No Typology Code available for payment source | Source: Ambulatory Visit | Attending: Neurology | Admitting: Neurology

## 2023-07-29 ENCOUNTER — Encounter (HOSPITAL_COMMUNITY): Payer: Self-pay

## 2023-07-29 VITALS — BP 121/78 | HR 98 | Temp 97.9°F | Resp 18

## 2023-07-29 DIAGNOSIS — G8929 Other chronic pain: Secondary | ICD-10-CM

## 2023-07-29 DIAGNOSIS — M545 Low back pain, unspecified: Secondary | ICD-10-CM | POA: Diagnosis not present

## 2023-07-29 MED ORDER — TIZANIDINE HCL 4 MG PO TABS: 4.0000 mg | ORAL_TABLET | Freq: Every day | ORAL | 0 refills | Status: DC

## 2023-07-29 NOTE — ED Provider Notes (Signed)
[facility]    CSN: [id number] Arrival date & time: 07/29/23  1828      History   Chief Complaint Chief Complaint  Patient presents with   complaint resolved    HPI Jennifer Cole is a 57 y.o. female.   She was at work yesterday and had back pain so severe she needed help to stand. She reports the pain was a 10/10. She took two tylenol pills (unknown strength) and pain completely improved. She reports she does have chronic pain and has been told that the disks in her back are disintegrating.  She does not take any medication for this routinely.  She has taken Tylenol and ibuprofen infrequently with relief.  Her pain is sporadic and happens more frequently when she is on her feet. Her pain is currently a 0/10.      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   Hammer toe of right foot 06/06/2018   Foot pain, right 06/06/2018   Chronic pain of both knees 03/21/2018   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   Hallux valgus (acquired), right foot 01/24/2018   Hallux rigidus of right foot 01/24/2018   Metatarsalgia of right foot 01/24/2018   Moderate episode of recurrent major depressive disorder (HCC) 11/04/2017   History of cervical cancer 11/01/2017    Past Surgical History:  Procedure Laterality Date   ABDOMINAL HYSTERECTOMY     CESAREAN SECTION     CHOLECYSTECTOMY     IR PERC PLEURAL DRAIN  W/INDWELL CATH W/IMG GUIDE  11/11/2020   IR REMOVAL OF PLURAL CATH W/CUFF  12/13/2020   IR REMOVAL TUN ACCESS W/ PORT W/O FL MOD SED  08/27/2021   PORTA CATH INSERTION N/A 11/18/2020   Procedure: PORTA CATH INSERTION;  Surgeon: Annice Needy, MD;  Location: [facility] INVASIVE CV LAB;  Service: Cardiovascular;  Laterality: N/A;    OB History     Gravida  4   Para  3   Term  3   Preterm      AB  1   Living  3      SAB      IAB      Ectopic      Multiple      Live Births  3            Home Medications    Prior to Admission medications   Medication Sig Start Date End Date Taking? Authorizing Provider  tiZANidine (ZANAFLEX) 4 MG tablet Take 1 tablet (4 mg total) by mouth at bedtime. 07/29/23  Yes Ahley Bulls, Ludger Nutting, NP  SUMAtriptan (IMITREX) 100 MG tablet Take 1 tablet (100 mg total) by mouth every 2 (two) hours as needed for migraine. May repeat in 2 hours if headache persists or recurs. 04/22/23   Zenia Resides, MD    Family History Family History  Problem Relation Age of Onset   Breast cancer Neg Hx  Ovarian cancer Neg Hx    Colon cancer Neg Hx    Diabetes Neg Hx     Social History Social History   Tobacco Use   Smoking status: Never   Smokeless tobacco: Never  Vaping Use   Vaping status: Never Used  Substance Use Topics   Alcohol use: Never   Drug use: Never     Allergies   Patient has no known allergies.   Review of Systems Review of Systems  All other systems reviewed and are negative.    Physical Exam Triage Vital Signs ED Triage Vitals [07/29/23 1844]  Encounter Vitals Group     BP 121/78     Systolic BP Percentile      Diastolic BP Percentile      Pulse Rate 98     Resp 18     Temp 97.9 F (36.6 C)     Temp Source Oral     SpO2 98 %     Weight      Height      Head Circumference      Peak Flow      Pain Score 0     Pain Loc      Pain Education      Exclude from Growth Chart    No data found.  Updated Vital Signs BP  121/78 (BP Location: Right Arm)   Pulse 98   Temp 97.9 F (36.6 C) (Oral)   Resp 18   SpO2 98%   Visual Acuity Right Eye Distance:   Left Eye Distance:   Bilateral Distance:    Right Eye Near:   Left Eye Near:    Bilateral Near:     Physical Exam Constitutional:      Appearance: Normal appearance. She is obese.  HENT:     Mouth/Throat:     Mouth: Mucous membranes are moist.  Cardiovascular:     Rate and Rhythm: Normal rate and regular rhythm.  Pulmonary:     Effort: Pulmonary effort is normal.  Musculoskeletal:     Cervical back: Normal range of motion.  Neurological:     Mental Status: She is alert and oriented to person, place, and time. Mental status is at baseline.  Psychiatric:        Mood and Affect: Mood normal.        Behavior: Behavior normal.      UC Treatments / Results  Labs (all labs ordered are listed, but only abnormal results are displayed) Labs Reviewed - No data to display  EKG   Radiology No results found.  Procedures Procedures (including critical care time)  Medications Ordered in UC Medications - No data to display  Initial Impression / Assessment and Plan / UC Course  I have reviewed the triage vital signs and the nursing notes.  Pertinent labs & imaging results that were available during my care of the patient were reviewed by me and considered in my medical decision making (see chart for details).    Evaluation suggests pain is musculoskeletal in nature. Will manage this with rest, gentle ROM exercises, heat therapy, as needed OTC medications for pain, and as needed use of muscle relaxer. Drowsiness precautions discussed regarding muscle relaxer use.  Imaging: no indication for imaging based on stable musculoskeletal exam findings May follow-up with PCP- information for PCP establishment given. Work excise note given.      Final Clinical Impressions(s) / UC Diagnoses   Final diagnoses:  Chronic bilateral low back pain,  unspecified whether sciatica present     Discharge Instructions      Pain has improved with the use of tylenol. Recommend continuing to use Tylenol as this has worked.  We will prescribe a muscle relaxer to help in the short-term.  However we recommend establishing with a primary care provider for further management of chronic pain. - You may take over the counter pain medications as directed/as needed for pain and inflammation.  Tylenol 1,000mg  every 6 hours and/or ibuprofen 600mg  every 6 hours as needed. - Take prescribed muscle relaxer as needed for muscle spasm and muscle tension. Heat to the lower back may help to relax muscles.   Please seek medical care for new symptoms such as a severe headache, weakness in your arms or legs, vision changes, shortness of breath, chest pain, or other new or worsening symptoms.  If your symptoms are severe, please go to the emergency room for evaluation.       ED Prescriptions     Medication Sig Dispense Auth. Provider   tiZANidine (ZANAFLEX) 4 MG tablet Take 1 tablet (4 mg total) by mouth at bedtime. 30 tablet Elmer Picker, NP      PDMP not reviewed this encounter.   Elmer Picker, NP 07/29/23 1955

## 2023-07-29 NOTE — ED Triage Notes (Signed)
Pt states she has no complaints now, she just would like some meds for back pain when she has it again.    Pt states that she was at work yesterday and she started having back pain. Someone at work gave her tylenol and in 2 hours the pain was resolved. She did not miss work yesterday but states she went to work late not due to back pain but due to transportation issues. Since she was late to work today HR told her to get a doctors note.

## 2023-07-29 NOTE — Discharge Instructions (Addendum)
Pain has improved with the use of tylenol. Recommend continuing to use Tylenol as this has worked.  We will prescribe a muscle relaxer to help in the short-term.  However we recommend establishing with a primary care provider for further management of chronic pain. - You may take over the counter pain medications as directed/as needed for pain and inflammation.  Tylenol 1,000mg  every 6 hours and/or ibuprofen 600mg  every 6 hours as needed. - Take prescribed muscle relaxer as needed for muscle spasm and muscle tension. Heat to the lower back may help to relax muscles.   Please seek medical care for new symptoms such as a severe headache, weakness in your arms or legs, vision changes, shortness of breath, chest pain, or other new or worsening symptoms.  If your symptoms are severe, please go to the emergency room for evaluation.

## 2023-09-16 NOTE — Telephone Encounter (Signed)
 Created in error

## 2024-03-16 ENCOUNTER — Ambulatory Visit: Admission: RE | Admit: 2024-03-16 | Payer: PRIVATE HEALTH INSURANCE | Source: Ambulatory Visit

## 2024-03-22 ENCOUNTER — Other Ambulatory Visit: Payer: Self-pay | Admitting: *Deleted

## 2024-03-22 DIAGNOSIS — C8520 Mediastinal (thymic) large B-cell lymphoma, unspecified site: Secondary | ICD-10-CM

## 2024-03-23 ENCOUNTER — Inpatient Hospital Stay: Payer: PRIVATE HEALTH INSURANCE | Admitting: Oncology

## 2024-03-24 ENCOUNTER — Telehealth: Payer: Self-pay | Admitting: Oncology

## 2024-03-24 ENCOUNTER — Encounter: Payer: Self-pay | Admitting: Oncology

## 2024-03-24 NOTE — Telephone Encounter (Signed)
 Attempted to call pt to reschedule CT. Pt phone number said out of service. Called pt daughter - number said out of service. Called pt son - number said invalid number.

## 2024-04-05 ENCOUNTER — Other Ambulatory Visit: Payer: Self-pay

## 2024-04-15 DIAGNOSIS — S20451A Superficial foreign body of right back wall of thorax, initial encounter: Secondary | ICD-10-CM | POA: Diagnosis not present

## 2024-04-15 DIAGNOSIS — W458XXA Other foreign body or object entering through skin, initial encounter: Secondary | ICD-10-CM | POA: Diagnosis not present

## 2024-04-15 DIAGNOSIS — L700 Acne vulgaris: Secondary | ICD-10-CM | POA: Diagnosis not present

## 2024-04-24 ENCOUNTER — Ambulatory Visit (HOSPITAL_COMMUNITY)
Admission: RE | Admit: 2024-04-24 | Discharge: 2024-04-24 | Disposition: A | Discharge status: Home / Self Care | Source: Ambulatory Visit | Attending: Internal Medicine | Admitting: Internal Medicine

## 2024-04-24 ENCOUNTER — Encounter (HOSPITAL_COMMUNITY): Payer: Self-pay

## 2024-04-24 VITALS — BP 130/82 | HR 106 | Temp 97.8°F | Resp 18

## 2024-04-24 DIAGNOSIS — Z4802 Encounter for removal of sutures: Secondary | ICD-10-CM

## 2024-04-24 NOTE — Discharge Instructions (Signed)
 Keep your wound clean and as dry as possible as it finishes healing.  It is very important for you to pay attention to any new symptoms or worsening of your current condition. Please go directly to the Emergency Department immediately should you begin to feel worse in any way or have any of the following symptoms: persistent fevers, increased pain, increased swelling, or increased redness.

## 2024-04-24 NOTE — ED Triage Notes (Signed)
 Pt states had a blackhead removed from her back on 8/2 at [facility] ED. States here for suture removal. Small redness noted. No drainage noted.

## 2024-04-24 NOTE — ED Provider Notes (Signed)
 [facility]   Note:  This document was prepared using Dragon voice recognition software and may include unintentional dictation errors.  MRN: [id number] DOB: 10/26/1965 DATE: 04/24/24   Subjective:  Chief Complaint:  Chief Complaint  Patient presents with   Suture / Staple Removal     HPI: Jennifer Cole is a 58 y.o. female presenting for suture removal. Patient states she was seen at [facility] ED on 04/15/2024 for a blackhead. Patient states the blackhead was in place for 2 months and then suddenly started causing her pain. She had the blackhead removed in the ED at that time. Per note, one suture was placed at that time and she was told to return in 7 days for removal. She states she still has some soreness to the area, but has improved since when she was first seen in the ER. Denies fever, discharge, nausea/vomiting. Presents NAD.  Prior to Admission medications   Not on File     No Known Allergies  History:   Past Medical History:  Diagnosis Date   Cancer (HCC)    cervical   Depression    Non Hodgkin's lymphoma (HCC)      Past Surgical History:  Procedure Laterality Date   ABDOMINAL HYSTERECTOMY     CESAREAN SECTION     CHOLECYSTECTOMY     IR PERC PLEURAL DRAIN W/INDWELL CATH W/IMG GUIDE  11/11/2020   IR REMOVAL OF PLURAL CATH W/CUFF  12/13/2020   IR REMOVAL TUN ACCESS W/ PORT W/O FL MOD SED  08/27/2021   PORTA CATH INSERTION N/A 11/18/2020   Procedure: PORTA CATH INSERTION;  Surgeon: Marea Selinda RAMAN, MD;  Location: [facility] INVASIVE CV LAB;  Service: Cardiovascular;  Laterality: N/A;    Family History  Problem Relation Age of Onset   Breast cancer Neg Hx    Ovarian cancer Neg Hx    Colon cancer Neg Hx    Diabetes Neg Hx     Social History   Tobacco Use   Smoking status: Never   Smokeless tobacco: Never  Vaping Use   Vaping status: Never Used  Substance Use Topics   Alcohol use: Never   Drug use: Never    Review of Systems  Constitutional:   Negative for fever.  Gastrointestinal:  Negative for nausea and vomiting.  Skin:  Positive for wound.     Objective:   Vitals: BP 130/82 (BP Location: Left Arm)   Pulse (!) 106   Temp 97.8 F (36.6 C) (Oral)   Resp 18   SpO2 97%   Physical Exam Constitutional:      General: She is not in acute distress.    Appearance: Normal appearance. She is well-developed. She is morbidly obese. She is not ill-appearing or toxic-appearing.  HENT:     Head: Normocephalic and atraumatic.  Cardiovascular:     Rate and Rhythm: Normal rate and regular rhythm.     Heart sounds: Normal heart sounds.  Pulmonary:     Effort: Pulmonary effort is normal.     Breath sounds: Normal breath sounds.     Comments: Clear to auscultation bilaterally  Abdominal:     General: Bowel sounds are normal.     Palpations: Abdomen is soft.     Tenderness: There is no abdominal tenderness.  Skin:    General: Skin is warm and dry.     Comments: Patient has well-healing excision site on left upper back. One suture in place. No warmth, erythema, or discharge.  Neurological:  General: No focal deficit present.     Mental Status: She is alert.  Psychiatric:        Mood and Affect: Mood and affect normal.     Results:  Labs: No results found for this or any previous visit (from the past 24 hours).  Radiology: No results found.   UC Course/Treatments:  Procedures: Procedures   Medications Ordered in UC: Medications - No data to display   Assessment and Plan :     ICD-10-CM   1. Encounter for removal of sutures  Z48.02       Encounter for removal of sutures Afebrile, nontoxic-appearing, NAD. VSS. Patient with one well healing suture in place. Suture removed by nurse without issue. Wound care instructions provided. Strict ED precautions were given and patient verbalized understanding.  ED Discharge Orders     None        PDMP not reviewed this encounter.     Basilia Ulanda SQUIBB,  PA-C 04/24/24 1823

## 2024-05-21 DIAGNOSIS — L989 Disorder of the skin and subcutaneous tissue, unspecified: Secondary | ICD-10-CM | POA: Diagnosis not present

## 2024-05-21 DIAGNOSIS — Z9889 Other specified postprocedural states: Secondary | ICD-10-CM | POA: Diagnosis not present

## 2024-06-09 DIAGNOSIS — R32 Unspecified urinary incontinence: Secondary | ICD-10-CM | POA: Diagnosis not present

## 2024-06-09 DIAGNOSIS — N819 Female genital prolapse, unspecified: Secondary | ICD-10-CM | POA: Diagnosis not present

## 2024-06-09 DIAGNOSIS — N898 Other specified noninflammatory disorders of vagina: Secondary | ICD-10-CM | POA: Diagnosis not present

## 2024-06-09 DIAGNOSIS — Z8541 Personal history of malignant neoplasm of cervix uteri: Secondary | ICD-10-CM | POA: Diagnosis not present

## 2024-06-09 DIAGNOSIS — Z90710 Acquired absence of both cervix and uterus: Secondary | ICD-10-CM | POA: Diagnosis not present

## 2024-06-09 DIAGNOSIS — Z1231 Encounter for screening mammogram for malignant neoplasm of breast: Secondary | ICD-10-CM | POA: Diagnosis not present

## 2024-06-09 DIAGNOSIS — Z0000 Encounter for general adult medical examination without abnormal findings: Secondary | ICD-10-CM | POA: Diagnosis not present

## 2024-06-09 DIAGNOSIS — Z1272 Encounter for screening for malignant neoplasm of vagina: Secondary | ICD-10-CM | POA: Diagnosis not present

## 2024-06-09 DIAGNOSIS — Z113 Encounter for screening for infections with a predominantly sexual mode of transmission: Secondary | ICD-10-CM | POA: Diagnosis not present

## 2024-08-09 DIAGNOSIS — R92323 Mammographic fibroglandular density, bilateral breasts: Secondary | ICD-10-CM | POA: Diagnosis not present

## 2024-08-09 DIAGNOSIS — Z1231 Encounter for screening mammogram for malignant neoplasm of breast: Secondary | ICD-10-CM | POA: Diagnosis not present

## 2024-08-16 DIAGNOSIS — Z8541 Personal history of malignant neoplasm of cervix uteri: Secondary | ICD-10-CM | POA: Diagnosis not present

## 2024-08-16 DIAGNOSIS — R87615 Unsatisfactory cytologic smear of cervix: Secondary | ICD-10-CM | POA: Diagnosis not present

## 2024-08-16 DIAGNOSIS — Z1272 Encounter for screening for malignant neoplasm of vagina: Secondary | ICD-10-CM | POA: Diagnosis not present

## 2024-09-01 ENCOUNTER — Ambulatory Visit (HOSPITAL_COMMUNITY): Admission: EM | Admit: 2024-09-01 | Discharge: 2024-09-01 | Disposition: A | Discharge status: Home / Self Care

## 2024-09-01 NOTE — ED Triage Notes (Signed)
 Pt present with c/o  lt elbow swelling x 5 days. Pt states this morning she woke up with bruising. Pt states she has taken ibuprofen .

## 2024-10-26 ENCOUNTER — Ambulatory Visit: Payer: Self-pay | Admitting: Family Medicine
